# Patient Record
Sex: Female | Born: 1935
Health system: Southern US, Community
[De-identification: ages and names within clinical notes are randomized; demographics above are authoritative.]

## PROBLEM LIST (undated history)

## (undated) DIAGNOSIS — M169 Osteoarthritis of hip, unspecified: Secondary | ICD-10-CM

## (undated) DIAGNOSIS — F32A Depression, unspecified: Secondary | ICD-10-CM

## (undated) DIAGNOSIS — D509 Iron deficiency anemia, unspecified: Secondary | ICD-10-CM

## (undated) DIAGNOSIS — F329 Major depressive disorder, single episode, unspecified: Secondary | ICD-10-CM

## (undated) DIAGNOSIS — R269 Unspecified abnormalities of gait and mobility: Secondary | ICD-10-CM

## (undated) DIAGNOSIS — E876 Hypokalemia: Secondary | ICD-10-CM

## (undated) DIAGNOSIS — S329XXA Fracture of unspecified parts of lumbosacral spine and pelvis, initial encounter for closed fracture: Secondary | ICD-10-CM

## (undated) DIAGNOSIS — M810 Age-related osteoporosis without current pathological fracture: Secondary | ICD-10-CM

## (undated) DIAGNOSIS — C4491 Basal cell carcinoma of skin, unspecified: Secondary | ICD-10-CM

## (undated) DIAGNOSIS — I10 Essential (primary) hypertension: Secondary | ICD-10-CM

## (undated) HISTORY — PX: CATARACT EXTRACTION, BILATERAL: SHX1313

## (undated) HISTORY — DX: Basal cell carcinoma of skin, unspecified: C44.91

## (undated) HISTORY — PX: WRIST FRACTURE SURGERY: SHX121

## (undated) HISTORY — DX: Osteoarthritis of hip, unspecified: M16.9

## (undated) HISTORY — DX: Depression, unspecified: F32.A

## (undated) HISTORY — DX: Essential (primary) hypertension: I10

## (undated) HISTORY — PX: OTHER SURGICAL HISTORY: SHX169

## (undated) HISTORY — DX: Iron deficiency anemia, unspecified: D50.9

## (undated) HISTORY — DX: Unspecified abnormalities of gait and mobility: R26.9

## (undated) HISTORY — DX: Major depressive disorder, single episode, unspecified: F32.9

## (undated) HISTORY — DX: Age-related osteoporosis without current pathological fracture: M81.0

---

## 1994-10-07 HISTORY — PX: SPINAL FUSION: SHX223

## 1998-01-16 ENCOUNTER — Ambulatory Visit (HOSPITAL_COMMUNITY): Admission: RE | Admit: 1998-01-16 | Discharge: 1998-01-16 | Payer: Self-pay | Admitting: Diagnostic Radiology

## 1998-02-02 ENCOUNTER — Ambulatory Visit (HOSPITAL_COMMUNITY): Admission: RE | Admit: 1998-02-02 | Discharge: 1998-02-02 | Payer: Self-pay | Admitting: Orthopaedic Surgery

## 1998-02-16 ENCOUNTER — Ambulatory Visit (HOSPITAL_COMMUNITY): Admission: RE | Admit: 1998-02-16 | Discharge: 1998-02-16 | Payer: Self-pay | Admitting: Orthopaedic Surgery

## 1998-03-08 ENCOUNTER — Ambulatory Visit (HOSPITAL_COMMUNITY): Admission: RE | Admit: 1998-03-08 | Discharge: 1998-03-08 | Payer: Self-pay | Admitting: Orthopaedic Surgery

## 2000-01-11 ENCOUNTER — Encounter: Admission: RE | Admit: 2000-01-11 | Discharge: 2000-01-11 | Payer: Self-pay | Admitting: Diagnostic Radiology

## 2001-12-16 ENCOUNTER — Inpatient Hospital Stay (HOSPITAL_COMMUNITY): Admission: AD | Admit: 2001-12-16 | Discharge: 2001-12-19 | Payer: Self-pay | Admitting: Internal Medicine

## 2001-12-17 ENCOUNTER — Encounter: Payer: Self-pay | Admitting: Internal Medicine

## 2003-01-10 ENCOUNTER — Encounter: Payer: Self-pay | Admitting: Orthopaedic Surgery

## 2003-01-10 ENCOUNTER — Encounter: Admission: RE | Admit: 2003-01-10 | Discharge: 2003-01-10 | Payer: Self-pay | Admitting: Orthopaedic Surgery

## 2003-01-10 ENCOUNTER — Ambulatory Visit (HOSPITAL_COMMUNITY): Admission: RE | Admit: 2003-01-10 | Discharge: 2003-01-10 | Payer: Self-pay | Admitting: Orthopaedic Surgery

## 2003-06-24 ENCOUNTER — Encounter: Payer: Self-pay | Admitting: Pulmonary Disease

## 2003-06-24 ENCOUNTER — Encounter: Admission: RE | Admit: 2003-06-24 | Discharge: 2003-06-24 | Payer: Self-pay | Admitting: Pulmonary Disease

## 2003-07-13 ENCOUNTER — Ambulatory Visit (HOSPITAL_COMMUNITY): Admission: RE | Admit: 2003-07-13 | Discharge: 2003-07-13 | Payer: Self-pay | Admitting: Neurology

## 2003-07-13 ENCOUNTER — Encounter: Payer: Self-pay | Admitting: Neurology

## 2004-01-24 ENCOUNTER — Ambulatory Visit (HOSPITAL_COMMUNITY): Admission: RE | Admit: 2004-01-24 | Discharge: 2004-01-24 | Payer: Self-pay | Admitting: Neurology

## 2004-01-25 ENCOUNTER — Ambulatory Visit (HOSPITAL_COMMUNITY): Admission: RE | Admit: 2004-01-25 | Discharge: 2004-01-25 | Payer: Self-pay | Admitting: Neurology

## 2004-03-20 ENCOUNTER — Encounter: Admission: RE | Admit: 2004-03-20 | Discharge: 2004-03-20 | Payer: Self-pay | Admitting: Neurology

## 2004-03-20 ENCOUNTER — Ambulatory Visit (HOSPITAL_COMMUNITY): Admission: RE | Admit: 2004-03-20 | Discharge: 2004-03-20 | Payer: Self-pay | Admitting: Neurology

## 2004-03-21 ENCOUNTER — Encounter: Admission: RE | Admit: 2004-03-21 | Discharge: 2004-03-21 | Payer: Self-pay | Admitting: Neurology

## 2004-09-06 HISTORY — PX: HIP FRACTURE SURGERY: SHX118

## 2004-09-13 ENCOUNTER — Ambulatory Visit: Payer: Self-pay | Admitting: Internal Medicine

## 2004-09-13 ENCOUNTER — Inpatient Hospital Stay (HOSPITAL_COMMUNITY): Admission: AD | Admit: 2004-09-13 | Discharge: 2004-09-17 | Payer: Self-pay | Admitting: Orthopaedic Surgery

## 2004-09-17 ENCOUNTER — Ambulatory Visit: Payer: Self-pay | Admitting: Physical Medicine & Rehabilitation

## 2004-09-17 ENCOUNTER — Inpatient Hospital Stay
Admission: RE | Admit: 2004-09-17 | Discharge: 2004-10-06 | Payer: Self-pay | Admitting: Physical Medicine & Rehabilitation

## 2004-09-18 ENCOUNTER — Ambulatory Visit (HOSPITAL_COMMUNITY)
Admission: RE | Admit: 2004-09-18 | Discharge: 2004-09-18 | Payer: Self-pay | Admitting: Physical Medicine & Rehabilitation

## 2004-11-16 ENCOUNTER — Encounter: Admission: RE | Admit: 2004-11-16 | Discharge: 2004-11-16 | Payer: Self-pay | Admitting: Orthopaedic Surgery

## 2005-03-07 ENCOUNTER — Inpatient Hospital Stay (HOSPITAL_COMMUNITY): Admission: RE | Admit: 2005-03-07 | Discharge: 2005-03-11 | Payer: Self-pay | Admitting: Orthopaedic Surgery

## 2005-03-07 ENCOUNTER — Ambulatory Visit: Payer: Self-pay | Admitting: Physical Medicine & Rehabilitation

## 2005-03-11 ENCOUNTER — Inpatient Hospital Stay
Admission: RE | Admit: 2005-03-11 | Discharge: 2005-03-16 | Payer: Self-pay | Admitting: Physical Medicine & Rehabilitation

## 2005-08-28 ENCOUNTER — Other Ambulatory Visit: Admission: RE | Admit: 2005-08-28 | Discharge: 2005-08-28 | Payer: Self-pay | Admitting: Geriatric Medicine

## 2006-10-07 DIAGNOSIS — C4491 Basal cell carcinoma of skin, unspecified: Secondary | ICD-10-CM

## 2006-10-07 HISTORY — DX: Basal cell carcinoma of skin, unspecified: C44.91

## 2008-11-15 ENCOUNTER — Inpatient Hospital Stay (HOSPITAL_COMMUNITY): Admission: EM | Admit: 2008-11-15 | Discharge: 2008-11-17 | Payer: Self-pay | Admitting: Emergency Medicine

## 2008-11-16 ENCOUNTER — Encounter (INDEPENDENT_AMBULATORY_CARE_PROVIDER_SITE_OTHER): Payer: Self-pay | Admitting: Interventional Radiology

## 2008-11-26 ENCOUNTER — Emergency Department (HOSPITAL_COMMUNITY): Admission: EM | Admit: 2008-11-26 | Discharge: 2008-11-26 | Payer: Self-pay | Admitting: Emergency Medicine

## 2009-07-14 ENCOUNTER — Encounter: Admission: RE | Admit: 2009-07-14 | Discharge: 2009-07-14 | Payer: Self-pay | Admitting: Geriatric Medicine

## 2009-08-23 ENCOUNTER — Encounter: Admission: RE | Admit: 2009-08-23 | Discharge: 2009-08-23 | Payer: Self-pay | Admitting: Otolaryngology

## 2010-09-08 ENCOUNTER — Emergency Department (HOSPITAL_COMMUNITY)
Admission: EM | Admit: 2010-09-08 | Discharge: 2010-09-08 | Payer: Self-pay | Source: Home / Self Care | Admitting: Emergency Medicine

## 2010-09-15 ENCOUNTER — Ambulatory Visit (HOSPITAL_COMMUNITY)
Admission: RE | Admit: 2010-09-15 | Discharge: 2010-09-15 | Payer: Self-pay | Source: Home / Self Care | Attending: Family Medicine | Admitting: Family Medicine

## 2010-09-15 ENCOUNTER — Encounter (INDEPENDENT_AMBULATORY_CARE_PROVIDER_SITE_OTHER): Payer: Self-pay | Admitting: Family Medicine

## 2010-09-16 ENCOUNTER — Observation Stay (HOSPITAL_COMMUNITY)
Admission: EM | Admit: 2010-09-16 | Discharge: 2010-09-18 | Payer: Self-pay | Source: Home / Self Care | Attending: Internal Medicine | Admitting: Internal Medicine

## 2010-09-17 ENCOUNTER — Encounter (INDEPENDENT_AMBULATORY_CARE_PROVIDER_SITE_OTHER): Payer: Self-pay | Admitting: Emergency Medicine

## 2010-12-11 ENCOUNTER — Other Ambulatory Visit: Payer: Self-pay | Admitting: Dermatology

## 2010-12-17 LAB — URINALYSIS, ROUTINE W REFLEX MICROSCOPIC
Bilirubin Urine: NEGATIVE
Glucose, UA: NEGATIVE mg/dL
Hgb urine dipstick: NEGATIVE
Ketones, ur: NEGATIVE mg/dL
Nitrite: NEGATIVE
Protein, ur: NEGATIVE mg/dL
Specific Gravity, Urine: 1.015 (ref 1.005–1.030)
Urobilinogen, UA: 1 mg/dL (ref 0.0–1.0)
pH: 7 (ref 5.0–8.0)

## 2010-12-17 LAB — CBC
HCT: 32.3 % — ABNORMAL LOW (ref 36.0–46.0)
Hemoglobin: 10.5 g/dL — ABNORMAL LOW (ref 12.0–15.0)
Hemoglobin: 10.9 g/dL — ABNORMAL LOW (ref 12.0–15.0)
MCH: 29.5 pg (ref 26.0–34.0)
MCHC: 31.1 g/dL (ref 30.0–36.0)
Platelets: 357 10*3/uL (ref 150–400)
RBC: 3.41 MIL/uL — ABNORMAL LOW (ref 3.87–5.11)
RBC: 3.45 MIL/uL — ABNORMAL LOW (ref 3.87–5.11)
WBC: 10.9 10*3/uL — ABNORMAL HIGH (ref 4.0–10.5)

## 2010-12-17 LAB — CARDIAC PANEL(CRET KIN+CKTOT+MB+TROPI)
CK, MB: 0.8 ng/mL (ref 0.3–4.0)
CK, MB: 0.9 ng/mL (ref 0.3–4.0)
Relative Index: INVALID (ref 0.0–2.5)
Total CK: 40 U/L (ref 7–177)
Total CK: 43 U/L (ref 7–177)
Troponin I: 0.04 ng/mL (ref 0.00–0.06)

## 2010-12-17 LAB — GLUCOSE, CAPILLARY: Glucose-Capillary: 99 mg/dL (ref 70–99)

## 2010-12-17 LAB — POCT I-STAT, CHEM 8
BUN: 16 mg/dL (ref 6–23)
Chloride: 104 mEq/L (ref 96–112)
Glucose, Bld: 102 mg/dL — ABNORMAL HIGH (ref 70–99)
HCT: 37 % (ref 36.0–46.0)
Potassium: 3.9 mEq/L (ref 3.5–5.1)

## 2010-12-17 LAB — URINE CULTURE
Colony Count: NO GROWTH
Culture: NO GROWTH

## 2010-12-17 LAB — COMPREHENSIVE METABOLIC PANEL
ALT: 13 U/L (ref 0–35)
AST: 21 U/L (ref 0–37)
Albumin: 3.2 g/dL — ABNORMAL LOW (ref 3.5–5.2)
Calcium: 8.8 mg/dL (ref 8.4–10.5)
Creatinine, Ser: 0.77 mg/dL (ref 0.4–1.2)
GFR calc Af Amer: >60 mL/min (ref >60)
Sodium: 139 mEq/L (ref 135–145)
Total Protein: 6.5 g/dL (ref 6.0–8.3)

## 2010-12-17 LAB — URINE MICROSCOPIC-ADD ON

## 2010-12-17 LAB — DIFFERENTIAL
Basophils Absolute: 0 10*3/uL (ref 0.0–0.1)
Eosinophils Relative: 2 % (ref 0–5)
Lymphocytes Relative: 23 % (ref 12–46)
Lymphocytes Relative: 36 % (ref 12–46)
Lymphs Abs: 2.6 10*3/uL (ref 0.7–4.0)
Monocytes Absolute: 0.6 10*3/uL (ref 0.1–1.0)
Monocytes Absolute: 1.1 10*3/uL — ABNORMAL HIGH (ref 0.1–1.0)
Monocytes Relative: 10 % (ref 3–12)
Monocytes Relative: 8 % (ref 3–12)
Neutro Abs: 6.9 10*3/uL (ref 1.7–7.7)

## 2010-12-17 LAB — POCT CARDIAC MARKERS
CKMB, poc: <1 ng/mL — ABNORMAL LOW (ref 1.0–8.0)
Troponin i, poc: <0.05 ng/mL (ref 0.00–0.09)

## 2010-12-17 LAB — TSH: TSH: 5.143 u[IU]/mL — ABNORMAL HIGH (ref 0.350–4.500)

## 2010-12-17 LAB — APTT: aPTT: 38 seconds — ABNORMAL HIGH (ref 24–37)

## 2010-12-17 LAB — BASIC METABOLIC PANEL
CO2: 27 mEq/L (ref 19–32)
Glucose, Bld: 98 mg/dL (ref 70–99)
Potassium: 3.2 mEq/L — ABNORMAL LOW (ref 3.5–5.1)
Sodium: 140 mEq/L (ref 135–145)

## 2010-12-17 LAB — CK TOTAL AND CKMB (NOT AT ARMC): Total CK: 51 U/L (ref 7–177)

## 2010-12-17 LAB — MRSA PCR SCREENING: MRSA by PCR: NEGATIVE

## 2010-12-17 LAB — PROTIME-INR: Prothrombin Time: 13 seconds (ref 11.6–15.2)

## 2011-01-22 LAB — CBC
HCT: 32.2 % — ABNORMAL LOW (ref 36.0–46.0)
HCT: 32.4 % — ABNORMAL LOW (ref 36.0–46.0)
HCT: 36.8 % (ref 36.0–46.0)
Hemoglobin: 11.2 g/dL — ABNORMAL LOW (ref 12.0–15.0)
Hemoglobin: 12.8 g/dL (ref 12.0–15.0)
MCV: 96.2 fL (ref 78.0–100.0)
Platelets: 261 10*3/uL (ref 150–400)
Platelets: 283 10*3/uL (ref 150–400)
RBC: 3.37 MIL/uL — ABNORMAL LOW (ref 3.87–5.11)
RBC: 3.89 MIL/uL (ref 3.87–5.11)
RDW: 13.5 % (ref 11.5–15.5)
RDW: 13.5 % (ref 11.5–15.5)
WBC: 10 10*3/uL (ref 4.0–10.5)
WBC: 10.4 10*3/uL (ref 4.0–10.5)
WBC: 17.2 10*3/uL — ABNORMAL HIGH (ref 4.0–10.5)

## 2011-01-22 LAB — DIFFERENTIAL
Basophils Absolute: 0 10*3/uL (ref 0.0–0.1)
Eosinophils Relative: 0 % (ref 0–5)
Eosinophils Relative: 4 % (ref 0–5)
Lymphocytes Relative: 12 % (ref 12–46)
Lymphocytes Relative: 7 % — ABNORMAL LOW (ref 12–46)
Lymphs Abs: 1.1 10*3/uL (ref 0.7–4.0)
Lymphs Abs: 1.2 10*3/uL (ref 0.7–4.0)
Monocytes Absolute: 0.9 10*3/uL (ref 0.1–1.0)
Monocytes Relative: 5 % (ref 3–12)
Neutro Abs: 7.8 10*3/uL — ABNORMAL HIGH (ref 1.7–7.7)
Neutrophils Relative %: 79 % — ABNORMAL HIGH (ref 43–77)

## 2011-01-22 LAB — POCT CARDIAC MARKERS
CKMB, poc: <1 ng/mL — ABNORMAL LOW (ref 1.0–8.0)
Troponin i, poc: <0.05 ng/mL (ref 0.00–0.09)

## 2011-01-22 LAB — URINALYSIS, ROUTINE W REFLEX MICROSCOPIC
Bilirubin Urine: NEGATIVE
Hgb urine dipstick: NEGATIVE
Nitrite: NEGATIVE
Specific Gravity, Urine: 1.012 (ref 1.005–1.030)
pH: 7.5 (ref 5.0–8.0)

## 2011-01-22 LAB — BASIC METABOLIC PANEL
GFR calc non Af Amer: >60 mL/min (ref >60)
Glucose, Bld: 99 mg/dL (ref 70–99)
Potassium: 4.1 mEq/L (ref 3.5–5.1)
Sodium: 138 mEq/L (ref 135–145)

## 2011-01-22 LAB — APTT: aPTT: 31 seconds (ref 24–37)

## 2011-02-19 NOTE — Discharge Summary (Signed)
[name], [name]              ACCOUNT NO.:  [id number]   MEDICAL RECORD NO.:  [id number]          PATIENT TYPE:  INP   LOCATION:  2013                         FACILITY:  [facility]   PHYSICIAN:  Corinna L. Lendell Caprice, MDDATE OF BIRTH:  1919-10-03   DATE OF ADMISSION:  11/15/2008  DATE OF DISCHARGE:  11/17/2008                               DISCHARGE SUMMARY   DISCHARGE DIAGNOSES:  1. Status post fall and resulting vertebral compression fractures at      T12 and L2.  2. Status post T12 and L2 kyphoplasties by interventional radiology on      November 16, 2008.  3. Hard-of-hearing.  4. Osteoporosis.  5. Chronic constipation.  6. Hypertension.   DISCHARGE MEDICATIONS:  1. Tylenol 650 mg p.o. q.4 h p.r.n. pain.  2. Continue Celexa at previous dose, question 20 vs. 40 mg a day.  3. Norvasc 2.5 mg a day.  4. Boniva monthly.  5. Aspirin 81 mg a day.  6. Stool softener daily.  7. Laxative as needed.  8. Vicodin 1 p.o. q.6 h p.r.n. pain.   CONDITION:  Stable.   CONSULTATIONS:  Interventional radiology.   PROCEDURES:  As above.   DIET:  Should be as tolerated.   ACTIVITY:  Fall precautions, continue with walker.  Home physical  therapy, occupational therapy has been arranged.  Follow-up with Dr.  Pete Glatter in 1-2 months, or sooner if needed.   LABS:  White count on admission was 17,000 with 88% neutrophils, the day  after admission was 10,000.  Basic metabolic panel, PT/PTT cardiac  enzymes, urinalysis normal.  Electrocardiogram showed normal sinus  rhythm with left atrial abnormality and left axis deviation.  CT of the  C-spine showed no fracture, anterior subluxation of C7 on T1.  CT of the  brain showed no acute intracranial findings, chronic small vessel  changes.  Two views of the chest showed low lung volumes, nothing acute.  Lumbar spine x-ray showed multilevel compression deformities are age  indeterminate, new from MRI from 2005.  MRI of the lumbar spine shows  minimal  loss of height at acute compression fractures of T12 and L2  without significant retropulsion of bone, healed L1 compression fracture  with over 50% loss of height and slight retropulsion of bone but no  significant associated stenosis.  Levoconvex scoliosis centered at L2-3,  grade 1 anterolisthesis of L3-4 with chronic endplate marrow change,  severe right foraminal and moderate left foraminal narrowing, moderate  to severe central canal stenosis at L3-4, mild central canal and  bifemoral biforaminal narrowing at L2-3, right greater than left, and  mild right foraminal narrowing at L1-2.  Grade 2 anterolisthesis at C7-  T1 and completely evaluated minimal disk bulging at T9-10, superior  endplate plate fracture of L2 with minimal loss of height, superior  endplate compression fracture T12 with minimal loss of height.   HISTORY AND HOSPITAL COURSE:  [name] is a pleasant 77 year old  white female patient of Dr. Pete Glatter who fell and was brought to the  emergency room.  The details surrounding the fall are rather sketchy.  Nevertheless, she  was found to have multiple vertebral compression  fractures, two of which were acute.  Patient was given pain medication  and supportive care.  Interventional radiology was consulted and  kyphoplasty was performed as above.  Interventional radiology has  evaluated the patient and cleared her for discharge.  Physical therapy  has evaluated the patient and recommended back to assisted living with  home PT, OT which I asked be arranged.  I have spoken with family and  they are agreeable to plan.      Corinna L. Lendell Caprice, MD  Electronically Signed     CLS/MEDQ  D:  11/17/2008  T:  11/17/2008  Job:  [id number]   cc:   Hal T. Stoneking, M.D.

## 2011-02-19 NOTE — H&P (Signed)
NAMELASHAWNDA, HANCOX              ACCOUNT NO.:  [id number]   MEDICAL RECORD NO.:  [id number]          PATIENT TYPE:  INP   LOCATION:  1833                         FACILITY:  [facility]   PHYSICIAN:  Hollice Espy, M.D.DATE OF BIRTH:  07-02-1919   DATE OF ADMISSION:  11/15/2008  DATE OF DISCHARGE:                              HISTORY & PHYSICAL   The patient's PCP is Dr. Merlene Laughter.   CONSULTANT:  On this case is interventional radiology.   CHIEF COMPLAINT:  Syncope and fall.   HISTORY OF PRESENT ILLNESS:  The patient is unable to recall much  history.  Her history is obtained from her daughter who is not present  but got reports from assisted living.  Apparently, the patient resides  at assisted-living facility and today was meant to catch a tour bus to  go on a trip.  Normally at her baseline, she is able to ambulate with a  walker.  It is unclear whether the patient fell first and then passed  out, or if she passed out leading to a fall, but either way, she fell  landing on her back.  She was then brought into the emergency room for  further evaluation.  In the emergency room, the patient had x-rays done  which noted a normal CT scan of the head and C-spine.  However, the  lumbar spine x-ray noted multilevel compression deformities that were  age-indeterminate felt to be new compared to an MRI done March 20, 2004.  In addition, the patient had some blood work done, and she was found  have essentially normal blood work except for a white count of 17.2 with  an 88% shift.  She had a negative chest x-ray and urinalysis in regards  to infection.  She was not hypotensive, and actually her blood pressure  160/66.  The patient herself was complaining of some back pain, she says  when she moves; when she does not move, it does not hurt, but she says  she can barely move.  Otherwise, she is doing well.  She denies any  headaches, vision changes.  No dysphagia.  No chest pain,  palpitations,  shortness of breath, wheezing or coughing.  No abdominal pain.  No  hematuria or dysuria.  No constipation, no diarrhea.  Pain only in her  back.  No weakness, numbness or pain in her legs.   REVIEW OF SYSTEMS:  Otherwise negative.   The patient's past medical history includes hypertension, previous  history of a right total hip arthroplasty in June 2006, history of  postoperative anemia, history of depression, osteoporosis, reportedly  history of early Alzheimer's although the patient appears to quite alert  and oriented for the most part when I was interviewing her.   MEDICATIONS:  Currently, we are trying to obtain the records sent over  from the assisted living facility.  This is pending.   ALLERGIES:  She has an allergy to PENICILLIN.   SOCIAL HISTORY:  No tobacco, alcohol or drug use.  She resides in an  assisted living facility.   FAMILY HISTORY:  Noncontributory.  PHYSICAL EXAMINATION:  VITALS:  On admission temperature 98.2, heart  rate 78, blood pressure 160/66 - now down to 136/76, respirations 18, O2  sat 99% on 2 liters.  In general, she is alert and oriented x2 and  pleasant no apparent distress except when she tries to move.  HEENT:  Normocephalic atraumatic.  Mucous membranes are slightly dry.  She has no carotid bruits.  HEART:  Regular rate and rhythm, S1-S2.  LUNGS:  Clear to auscultation bilaterally.  ABDOMEN:  Soft, nontender, nondistended.  Positive bowel sounds.  EXTREMITIES:  Showed no clubbing, cyanosis or edema.  She has good  musculoskeletal range in her legs and feet.  Pedal pulses are normal.  She has no loss of sensation.  No findings of any neurological deficits.   LAB WORK:  Lumbar spine films note as per HPI multilevel compression  deformities new since June 2005.  Chest x-ray notes low lung volumes  with no evidence of any acute findings.  Cluster of nodular densities  within the left upper lobe are chronic.  Chronic rib  fracture on the  left 8th rib and right lateral rib.  CT scan of the head shows no acute  intracranial findings, chronic small vessel disease changes.  Cervical  spine:  No fracture, some anterior subluxation of C7 on T1.  Urinalysis  normal.  Sodium 130, potassium 4.1, chloride 102, bicarb 28, BUN 11,  creatinine 0.6, glucose 99.  Cardiac markers unremarkable.  Coags  unremarkable.  White count 17.2 with an 88% shift, H and H 12.8 and 37,  MCV of 95, platelet count 343.   ASSESSMENT AND PLAN:  1. Syncope.  2. Fall.  3. Questionable new compression fracture.  4. Back pain.  5. Leukocytosis.  6. Hypertension.   Will check MRI, PT, OT, questionable kyphoplasty.  Will consult  interventional radiology.  Monitor on telemetry.  Pain and nausea  control.  Hold her blood pressure medicines until we determine that she  is not having orthostatic hypotension with syncope.      Hollice Espy, M.D.  Electronically Signed     SKK/MEDQ  D:  11/15/2008  T:  11/15/2008  Job:  [id number]   cc:   Hal T. Stoneking, M.D.

## 2011-02-22 NOTE — Op Note (Signed)
NAME:  [name], [name]                        ACCOUNT NO.:  [id number]   MEDICAL RECORD NO.:  [id number]                   PATIENT TYPE:  OUT   LOCATION:  MDC                                  FACILITY:  [facility]   PHYSICIAN:  Genene Churn. Love, M.D.                 DATE OF BIRTH:  1919-10-07   DATE OF PROCEDURE:  01/25/2004  DATE OF DISCHARGE:                                 OPERATIVE REPORT   PROCEDURE NOTE:  The patient was prepped and draped in the left lateral  decubitus position using Betadine and 1% Xylocaine.  She had had previous  fusions at what appeared to be L4-5 and L5-S1 and L3-L4 was entered.  Opening pressure of 120 mm of water and 25 cc of clear colored CSF was  removed.  The patient tolerated it well.  A stand-walk and sit test was  performed with two chairs, 15 feet apart, before and after the procedure.  The before LP time was 1 minute and 9 seconds and after LP time was 1  minute, 15 seconds.   IMPRESSION:  Negative LP drainage procedure test for NPH.  Fluid was sent  for studies, including protein, glucose, VDRL, cell count, diff, tau, and  beta amyloid 42 protein, with 2 tubes to be held.                                               Genene Churn. Sandria Manly, M.D.    JML/MEDQ  D:  01/25/2004  T:  01/25/2004  Job:  [id number]

## 2011-02-22 NOTE — Consult Note (Signed)
NAMEALIVYA, [name]              ACCOUNT NO.:  [id number]   MEDICAL RECORD NO.:  [id number]          PATIENT TYPE:  ORB   LOCATION:  4522                         FACILITY:  [facility]   PHYSICIAN:  Genene Churn. Love, M.D.    DATE OF BIRTH:  [birth year]   DATE OF CONSULTATION:  09/20/2004  DATE OF DISCHARGE:                                   CONSULTATION   This 77 year old right-handed widowed white female from [place], [place], is seen while in the [facility] for evaluation of a fall and gait disorder.   HISTORY OF PRESENT ILLNESS:  Ms. Curtis has a history of low back pain and  underwent L5-S1 fusion in 1976 for left lumbar radiculopathy.  She has had  recurrent back pain and been seen by me in the summer of 2005 with L4 and L3  nerve root irritation on the right and documented MRI study of the lumbar  spine showing listhesis of 3 on 4 by 8 to 9 mm with central canal stenosis,  lateral recess stenosis, left canal stenosis and right canal stenosis and  right foraminal encroachment at that level involving the right L3 and right  L4 nerve roots.  In addition, there was lesser stenosis at L2-3 and at L4-5.  She also has a history of a chronic memory disorder with behavioral changes  beginning in 2004.  At one point it was decided that she would transfer her  residence to [place], [place], to [place] but she decided  against that decision in the summer of 2005.  She has had a history of  balance problems for over six years with tendency to fall beginning in 2001,  breaking ribs; with a fall also in 2004 and injury to her left wrist  requiring fixation.  She has had hypokalemia presumably secondary to  diuretic use.  She has not had any history of alcoholism.  She has undergone  a MRI study of the brain showing evidence of mild atrophy and small vessel  disease, ischemic changes July 13, 2003, with an MRA of the  intracranial  system showing mild atherosclerotic changes without significant proximal  stenosis.  TSH, sed rate and vitamin B12 level have been normal but CSF  evaluation was positive for tau and beta amyloid, 42 protein indicative of  Alzheimer's disease.  She has had a known history of hypertension but no  documented clinical stroke.  No cigarette use.  No diabetes mellitus and no  known heart disease. She was in her usual state of health until September 13, 2004, when she got up to go to the bathroom about 3 a.m. and fell.  There is  no associated neurologic symptomatology.  She was transferred from  [place], [place], to [place], [place], and underwent  left hip screw fixation by Dr. Cleophas Dunker.  She has been on the subacute  rehab unit since that time.   Her laboratory data in the hospital has revealed negative urine for  infection.  White blood cell count 6600, hemoglobin 12.5 with hematocrit  38.0, platelet count 364, 50% polys, 30% lymphs, 11% monocytes, 6%  eosinophils.  Her basic metabolic panel has been normal.  Sodium 140,  potassium 3.7, chloride 104, CO2 content 28, glucose 98, BUN 8 to 10.  Mildly elevated liver function tests with AST of 39 upper limits of normal  37, ALT 48 upper limits of normal 40 and a slightly elevated total bilirubin  of 1.3.   Her CT scan of the chest with contrast September 18, 2004, showed asymmetric  pleural opacities in the right apex and posterior left upper lobe probably  secondary to scarring but neoplasm could not be excluded.  It was  recommended that a follow-up CT scan be obtained in three months.  She had a  2 cm fluid density lesion in the upper pole of the right kidney which has  been known about and a tiny notch on the right posterior thyroid.   PAST MEDICAL HISTORY:  1.  Gait disorder.  2.  Right L3 and L4 radiculopathies.  3.  Lumbar spinal stenosis, L3-4 greater than L2-3 and L5-S1.  4.  History of previous  lumbar spine surgery for left lumbar radiculopathy      in 1976 at L5-S1 with fusion.  5.  Alzheimer's disease.  6.  She is status post bilateral cataract surgery.  7.  She is noted to have a cyst on her kidney.  8.  She has had a wrist fracture in the past.   MEDICATIONS PRIOR TO ADMISSION:  1.  Norvasc 5 mg daily.  2.  Celexa 20 mg daily.  3.  Fosamax 70 mg.   CURRENT MEDICATIONS:  1.  Warfarin.  2.  Celexa.  3.  KCl.  4.  Norvasc.  5.  Senokot.  6.  Multivitamins.  7.  Lovenox.  8.  Also p.r.n.'s.   PHYSICAL EXAMINATION:  GENERAL APPEARANCE:  A well-developed, thin white  female.  VITAL SIGNS:  Blood pressure right and left arm of 160/80, heart rate 65,  temperature 97.8, respiratory rate 20, O2 saturation 98% on room air.  NEUROLOGIC:  She was alert and oriented x3.  She scored 27/30 on an MMSE.  Cranial nerves revealed visual fields full.  She was status post cataract  surgery bilaterally.  Discs were flat.  Extraocular movements were full and  corneals were present.  There was no seventh nerve palsy.  Tongue was  midline.  The uvula was midline.  Gags were present.  Sternocleidomastoid  and trapezius testing were normal.  Motor examination revealed outstretched  hand and arm tremor.  Gait was not tested but strength testing individually  was intact.  She had absent ankle jerks.  Downgoing plantar responses.  She  felt pinprick in all extremities.  As mentioned, her gait was not tested.   IMPRESSION:  1.  Gait disorder with retropulsion, status post left hip fracture, code      781.2.  2.  Lumbar spine disease with spinal stenosis L3-4 greater than L2-3 and L4-      5 with listhesis at L3-4 of 8 mm.  She is status post L5-S1 fusion in      1976 for left lumbar radiculopathy, code 724.4, 724.02.  3.  Dementia, most likely Alzheimer's disease, code 331.0.  4.  Hypertension, code 796.2.  5.  Osteoporosis, code 733.0.  6.  History of left wrist fracture.  PLAN:   Consider cervical spine MRI for evaluation of her gait in three weeks  following her evaluation.  Other studies are present in my office including  PET scan, MRIs of the brain, CSF evaluation, B12, T4.       JML/MEDQ  D:  09/20/2004  T:  09/20/2004  Job:  [id number]   cc:   Hal T. Stoneking, M.D.  [address]  [place], [place] [address]  Fax: [phone]   Claude Manges. Cleophas Dunker, M.D.  [address] [place]  [place] [address]  Fax: [phone]

## 2011-02-22 NOTE — H&P (Signed)
NAMEKYLEE, Curtis NO.:  [id number]   MEDICAL RECORD NO.:  [id number]            PATIENT TYPE:   LOCATION:                                 FACILITY:   PHYSICIAN:  Claude Manges. Whitfield, M.D.DATE OF BIRTH:  [birth year]   DATE OF ADMISSION:  03/07/2005  DATE OF DISCHARGE:                                HISTORY & PHYSICAL   CHIEF COMPLAINT:  Right hip pain.   HISTORY OF PRESENT ILLNESS:  The patient is an 77 year old white female with  a recent history of a left hip nondisplaced intertrochanteric fracture which  was repaired with compression screw fixation.  The patient has been doing  well with that, but she also has been having problems with her right hip for  several years now.  With ambulating and transitioning from sitting to  standing, she has some discomfort in the hip described as a deep dull aching  sensation in the groin around to the lateral posterior buttocks region with  some occasional radiation down to the knee.  She has a sense that her hip is  shifting around.  She has some stiffness and soreness with attempts at range  of motion.  She does have night pain.  She is currently ambulating with the  use of a walker due to a previous fall history.   X-rays reveal collapse of the femoral head indicating possible avascular  necrosis.   ALLERGIES:  DICLOXACILLIN.   CURRENT MEDICATIONS:  1.  Fosamax 70 mg p.o. q.week.  2.  Norvasc 5 mg p.o. daily.  3.  Citalopram 40 mg p.o. daily.  4.  Ibuprofen 200 mg p.o. p.r.n.  5.  Acetaminophen 500 mg p.o. p.r.n.  6.  Glucosamine with MSN one tablet p.o. daily.  7.  Calcium one tablet a day.  8.  Baby aspirin 81 mg p.o. daily.   PAST MEDICAL HISTORY:  Hypertension.   PAST SURGICAL HISTORY:  Spinal fusion in [facility] at [facility] by Dr. __________.  Hand surgery by Katy Fitch. Sypher, M.D.  Left hip fracture repaired by Claude Manges. Cleophas Dunker, M.D. in December of 2005.  The patient states that her only  complications with  anesthesia in the past is post spinal fusion she passed  out and had to be resuscitated.   SOCIAL HISTORY:  The patient is an 77 year old white female living at  assisted living.  She denies any history of smoking or alcohol use.  She is  widowed with several grown children.  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   PRIMARY CARE PHYSICIAN:  Hal T. Stoneking, M.D.   FAMILY HISTORY:  Mother is deceased from pulmonary edema.  Father is  deceased from an accident.  One sister deceased from complications of  diabetes.   REVIEW OF SYSTEMS:  Positive for partial lower plates.  She does wear  glasses.  She does wear a left-sided hearing aid.  Otherwise, all other  categories are negative and noncontributory.   PHYSICAL EXAMINATION:  VITAL SIGNS:  Height is 5 feet 11 inches, weight is  98 pounds, blood pressure 112/60,  pulse 76 and regular, respirations 12, the  patient is afebrile.  GENERAL:  This is a short in stature, thin, frail white female who ambulates  with the use of a walker.  Very slow, deliberate gait.  She is able to get  on and off the examination table with just a little balance assistance.  HEENT:  Head was normocephalic.  Pupils equal, round, and reactive to light.  Extraocular movements intact.  Sclerae is anicteric.  External ears without  deformity.  Left hearing aid is intact.  The patient is able to hear fairly  well with normal voice tones.  NECK:  Supple, no palpable lymphadenopathy.  Thyroid region nontender.  She  had good range of motion of her cervical spine without difficulty or  tenderness.  CHEST:  Lung sounds were clear and equal bilaterally.  No wheezes, rales, or  rhonchi.  HEART:  Regular rate and rhythm, no murmurs, rubs, or gallops.  ABDOMEN:  Soft, nontender.  Bowel sounds normal active throughout.  CVA  region was nontender.  EXTREMITIES:  Upper extremities were symmetrically size and shape.  She had  full range of motion of her  shoulders, elbows, and wrists.  Motor strength  was 5/5.  Lower extremities; left hip had full extension.  She was easily  able to flex it up to about 115 degrees.  She had about 15 to 20 degrees  internal and external rotation without any discomfort.  Right hip had full  extension.  She was able to flex it up to about 110 degrees.  She had about  10 degrees of internal and external rotation with some discomfort over the  last aspect of the hip with internal rotation.  Bilateral knees were  symmetrical.  No signs of erythema or effusions.  No discomfort.  She had  full range of motion.  No instability.  Calves were nontender.  The ankles  were symmetrical with good dorsal and plantar flexion.  PERIPHEROVASCULAR:  Carotid pulses were 2+ with no bruits.  Radial pulses  were 2+.  Unable to palpate any posterior or tibial or dorsalis pedis  pulses, but her skin was warm, dry, and intact.  No pigmentation changes.  Blanched briskly.  No lower extremity edema noted at this time.  NEUROLOGY:  The patient was conscious, alert, and appropriate.  Easy  conversation with the examiner.  Cranial nerves II-XII grossly intact.  She  had no gross neurologic defects noted.  BREASTS:  RECTAL:  GENITOURINARY:  Deferred at this time.   IMPRESSION:  1.  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  2.  History of left hip intertrochanteric fracture with compression screw      fixation in December of 2005.  3.  Hypertension.  4.  Questionable difficulty with balance.   PLAN:  The patient has been evaluated by her primary care physician, Dr.  Pete Glatter, for preoperative evaluation and he has cleared her.  The patient  will undergo all other routine labs and tests prior to having a right total  hip arthroplasty by Dr. Cleophas Dunker at [facility] on March 07, 2005.      RWK/MEDQ  D:  02/06/2005  T:  02/06/2005  Job:  [id number]

## 2011-02-22 NOTE — Discharge Summary (Signed)
[facility]  Patient:    [name], [name] Visit Number: [id number] MRN: [id number]          Service Type: MED Location: [phone] Attending Physician:  Edwyna Perfect Dictated by:   Bonnell Public, M.D. Admit Date:  12/16/2001 Discharge Date: 12/19/2001                             Discharge Summary  DATE OF BIRTH:  1919-03-17  DISCHARGE DIAGNOSES: 1. Escherichia coli bacteremia. 2. Gastroenteritis. 3. Deconditioning. 4. Mild hyponatremia. 5. Normocytic anemia. 6. Hypokalemia. 7. Hypertension.  DISCHARGE MEDICATIONS: 1. Tequin 400 mg p.o. q.d. 2. Restoril 50 mg p.o. q.h.s. p.r.n. 3. Ambien 5 to 10 mg p.o. q.h.s. p.r.n. 4. Norvasc 5 mg p.o. q.d.  HISTORY OF PRESENT ILLNESS:  [name] is a very active and usually healthy 77 year old white female with past medical history or above who is being transferred from [facility] system secondary to severe gastroenteritis with E. coli bacteremia, the main reasoning being that her family lives in [place] and would not be able to take care of her personal affairs while she is away.  The patient had large amounts of mucoid, watery diarrhea without any blood prior to admission at the other hospital.  She also had nausea and poor appetite.  She also reports some chills.  The patient then collapsed, was taken to the hospital, and was found to be hypotensive, hyponatremic, and hypokalemic.  She was resuscitated with IV fluids and given Rocephin.  Blood cultures return 2 out of 2 positive for E. coli sensitive to fluoroquinolones and Trim-Sulfa.  During the few days in the [facility], she apparently did not get out of bed.  When she did finally ambulate, she was noted to be very weak and unstable in her gait.  Her gait was also very poor. The patient was transferred to our hospital on December 16, 2001.  PAST MEDICAL HISTORY:  As above.  It also includes  status post three C sections and lumbar stenosis with surgery in 1976.  History of TB treated three different times.  SOCIAL HISTORY:  The patient lives in [place] with her 58 year old husband who is also active.  No tobacco, no alcohol.  PHYSICAL EXAMINATION:  VITAL SIGNS:  On transfer, temperature was 99.2, pulse 75, blood pressure 142/62, O2 saturation 93% on room air.  GENERAL:  Alert and oriented x 3, very pleasant white female.  HEENT:  Normocephalic, atraumatic.  PERRL.  No oropharyngeal erythema or exudate.  NECK:  No JVD, no lymphadenopathy, no bruits.  LUNGS:  Clear to auscultation bilaterally, no wheezing.  CARDIOVASCULAR:  Regular rate and rhythm.  S1, S2, with 3/5 systolic murmur, left sternal border.  ABDOMEN:  Soft, nontender, mildly distended.  No organomegaly.  Active bowel sounds.  NEUROLOGIC:  Cranial nerves II-XII grossly intact.  No motor or sensory deficits.  She did have a guarded gait without any cerebellar findings.  Her motor exam was 5/5, symmetrical, all extremities.  LABORATORY DATA:  Sodium 133, potassium 3.9, chloride 103, CO2 25, BUN 6, creatinine 0.7, glucose 112.  White blood cells 7.8, hemoglobin 9.9, platelets 347.  Total bilirubin 0.5, alkaline phosphatase 112, ALP 26, ALT 28, total protein 5.1, albumin 2.1.  HOSPITAL COURSE:  #1 - ESCHERICHIA COLI BACTEREMIA:  This was secondary to severe gastroenteritis.  The patient continued to receive Tequin intravenously.  The  bacteria were sensitive to fluoroquinolones.  The patient did well and finished a course of Tequin.  #2 - GASTROENTERITIS:  Her symptoms had essentially resolved by the time she was transferred to our hospital.  The patient, however, was still weak and thought to be still mildly dehydrated.  Therefore, IV fluids were continued at a rate of 150 cc per hour of normal saline.  #3 - DECONDITIONING:  We obtained a physical therapy consult, and the patient was encouraged  to ambulate during her days in the hospital.   The patient actually did fairly well and was ambulating in the hallways with the aid of a walker by the time of discharge.  We felt confident in sending her home given that she has led a very physically active life up to the time of hospitalization.  Apparently this patient goes to the gym about once a day and engages in cardiovascular activities.  #4 - HYPONATREMIA SECONDARY TO DEHYDRATION:  This was corrected by the time of discharge.  #5 - NORMOCYTIC ANEMIA:  We had checked ferritin which was 80, vitamin B12 level normal, and folate level normal.  By the time of discharge, her hemoglobin was 10.1.  It was noted to her family, who are also physicians, that she will need outpatient workup for this unexplained anemia in order to essentially rule out possibility of colon cancer.  #6 - HYPERTENSION:  The patients blood pressure was elevated on the day of discharge at 158/72.  The patient was sent out on Norvasc 5 mg a day which is half of her regular dose. Dictated by:   Bonnell Public, M.D. Attending Physician:  Edwyna Perfect DD:  02/05/02 TD:  02/05/02 Job: [id number]

## 2011-02-22 NOTE — Discharge Summary (Signed)
[name], [name] NO.:  [id number]   MEDICAL RECORD NO.:  [id number]          PATIENT TYPE:  INP   LOCATION:  5023                         FACILITY:  [facility]   PHYSICIAN:  Claude Manges. Whitfield, M.D.DATE OF BIRTH:  Jun 05, 1919   DATE OF ADMISSION:  09/13/2004  DATE OF DISCHARGE:  09/17/2004                                 DISCHARGE SUMMARY   ADMISSION DIAGNOSES:  1.  Left intertrochanteric fracture.  2.  Hypertension.  3.  Balance disturbance.  4.  Early Alzheimer's.  5.  Depression.  6.  Osteoarthritis right hip.  7.  Degenerative disc disease lumbar spine.   DISCHARGE DIAGNOSES:  1.  Left hip compression screw and plate fixation of intertrochanteric      fracture.  2.  Urinary tract infection.  3.  Postoperative blood loss anemia requiring transfusion.  4.  History of hypertension.  5.  History of balance disturbance.  6.  History of early Alzheimer's.  7.  History of depression.  8.  History of osteoarthritis right hip.  9.  History of degenerative disc disease lumbar spine.   HISTORY OF PRESENT ILLNESS:  The patient is an 77 year old female with a  history of balance problems and possible early Alzheimer's, who was going to  the bathroom on the date of admission when she fell on her left side.  The  patient complained of left hip pain and was unable to get up and off the  floor.  Was found later the following morning when her driver came to pick  her up.  The patient was brought to the emergency room for evaluation and  was found to have a left hip intertrochanteric fracture, and she was  admitted to obtain medical clearance for the surgical procedure for this  repair.   ALLERGIES:  DICLOXACILLIN.   CURRENT MEDICATIONS:  1.  Fosamax 70 mg p.o. q. week.  2.  Norvasc 5 mg p.o. daily.  3.  Celexa 20 mg p.o. daily.  4.  Multivitamin.  5.  Vitamin E.   SURGICAL PROCEDURE:  On September 13, 2004, the patient was taken to the O.R.  by Dr. Norlene Campbell, and underwent general anesthesia the patient had a  compression screw and plate fixation of her intertrochanteric fracture of  her left hip.  The patient tolerated the procedure well.  There were no  complications.  The patient was transferred to the recovery room in good  condition.   CONSULTS:  1.  A [facility] consult was requested for evaluation of the      patient for medical clearance and for management of her multiple medical      issues during her hospitalization.  2.  Routine consults for physical therapy, occupational therapy, and rehab      were requested for her care.   HOSPITAL COURSE:  On September 13, 2004, the patient was admitted to [facility] under the care of Dr. Norlene Campbell.  The patient was  evaluated in the emergency room, found to have a left intertrochanteric  fracture, also requiring medical evaluation prior to  surgical procedure, so  this was requested and performed by the [facility] practice,  and the patient was cleared for surgery, with recommendations of IV  potassium replacement and evaluation of possible elevated assisted living  level of care postoperatively due to her current capabilities.  The patient  was then subsequently taken to the O.R. by Dr. Cleophas Dunker, where an ORIF of  her intertrochanteric fracture was performed with a compression screw and  plate without any complications.   The patient then incurred a total of four days of postoperative care on the  orthopedic floor, in which the patient did have some low-grade temperatures  for multiple days, but she was also found to have some bacteria on her  admission urinalysis, so she was placed on Cipro, and her urine officially  cleared as did her low-grade temperatures.  The patient's mental status  remained stable, and she worked fairly well with the staff.  Her wound  remained benign for any signs of infection.  Her leg remained neuromotor   vascularly intact.  She also developed some postoperative blood loss anemia,  and she had a little light-headedness with getting up for ambulation, so she  was typed and crossed and transfused 2 units of packed red blood cells, with  followup on the rehab unit.  Her hypokalemia did resolve without any further  complications, and the patient was evaluated by rehab services and accepted  on the subacute care unit due to the length of stay which they felt was  going to be necessary to get her independent, so she was transferred to that  unit in good condition on postop day four.   LABORATORIES:  Chest x-ray on September 15, 2004 showed no acute  cardiopulmonary disease, with a possible sclerotic lesion involving  posterior 12th ribs bilaterally and the right posterior 10th rib,  questioning whether there was a history of malignancy.  These results were  telephoned to the clinical services caring for the patient on 5000.   CBC on September 16, 2004:  WBC 7.4, hemoglobin 8.4, hematocrit 24, platelets  296.  She was typed and crossed and transfused 2 units of packed red blood  cells, with followup on the next chart for rehab services.   On September 17, 2004, INR was 1.2.   Routine chemistries on September 16, 2004:  Sodium 138, potassium 3.6,  glucose 104, BUN 8, creatinine 0.5.   Urinalysis on September 13, 2004 found trace hemoglobin, rare bacteria, and on  a recheck on September 15, 2004 found trace hemoglobin, no bacteria noted.  Culture was no growth after one day.   MEDICATIONS UPON DISCHARGE FROM ORTHOPEDICS FLOOR:  1.  Norvasc 5 mg p.o. daily.  2.  Celexa 20 mg p.o. daily.  3.  Multivitamin with minerals 1 tablet p.o. daily.  4.  Colace 100 mg p.o. b.i.d.  5.  Laxative or enema of choice p.r.n.  6.  Vicodin 1 tablet p.o. q.4 h. p.r.n.  7.  Tylenol 650 mg p.o. q.4 h. p.r.n.  8.  Robaxin 500 mg p.o. q.6 h. p.r.n.  9.  Coumadin 3 mg p.o. daily. 10. Potassium chloride 20 mEq p.o.  b.i.d.  11. Cipro 250 mg p.o. b.i.d.   DISCHARGE INSTRUCTIONS:  1.  Medications.  The patient is continue medications as dispensed on      orthopedics floor.  2.  Labs.  Please check hemoglobin level after receiving transfusion.  3.  Diet.  No restrictions.  4.  Wound care.  The patient should have wound checked daily for any signs      of infection.  Staples may be removed on postop day 14, with Steri-      Strips applied if in the hospital.  5.  Activity.  The patient may be weightbearing as tolerated with the use of      a walker and close supervision with physical therapy.  6.  Followup.  The patient needs a follow-up appointment with Dr. Cleophas Dunker      in his office one week from discharge from rehab services.  Please call      to set up this appointment.   CONDITION UPON DISCHARGE TO REHAB SERVICES:  Improved and good.      RWK/MEDQ  D:  11/21/2004  T:  11/21/2004  Job:  [id number]

## 2011-02-22 NOTE — H&P (Signed)
[name], [name] NO.:  [id number]   MEDICAL RECORD NO.:  [id number]          PATIENT TYPE:  ORB   LOCATION:  4524                         FACILITY:  [facility]   PHYSICIAN:  Ranelle Oyster, M.D.DATE OF BIRTH:  03-05-1919   DATE OF ADMISSION:  03/11/2005  DATE OF DISCHARGE:                                HISTORY & PHYSICAL   CHIEF COMPLAINT:  Right hip pain.   HISTORY OF PRESENT ILLNESS:  This is an 77 year old white female with  history of left intratrochanteric hip fracture in December of last year who  was on rehab for a period time and was admitted with increased hip pain on  June 1.  The patient ultimately underwent a right total hip replacement the  same day.  The patient is partial weightbearing on the right hip.  She  developed postoperative anemia of 8.7 and was transfused 2 units of packed  red blood cells March 08, 2005, with increase in her hemoglobin to 11.2.  The  patient completed Cipro for Citrobacter UTI.  The patient had some mild  mental status changes due to narcotics.  She denies any significant pain.  She has had supplementation of hypokalemia.   REVIEW OF SYSTEMS:  The patient reports some low back pain, reflux,  generalized weakness.  Denies significant right hip pain.  Has been sleeping  well.  She does report constipation.  Has no bladder complaints.  Full  Review of Systems is in the written H&P.   PAST MEDICAL HISTORY:  1.  Positive for left intratrochanteric hip fracture.  2.  Osteoporosis.  3.  Hypertension.  4.  Back surgery.  5.  Left wrist surgery.  6.  Left carpal tunnel release.  7.  Basal cell carcinoma.   History is negative for alcohol or tobacco.   FAMILY HISTORY:  Noncontributory.   SOCIAL HISTORY:  The patient is a resident of [facility].  She was independent with walker prior to admission.   Current functional status reveals patient is at moderate assistance with bed  mobility,  supervision for transfers, supervision for gait 50 feet with  rolling walker.   MEDICATIONS PRIOR TO ADMISSION:  1.  Fosamax 20 mg weekly.  2.  Norvasc 5 mg daily.  3.  Aspirin 81 mg daily.  4.  Multivitamins daily.   ALLERGIES:  DICLOXACILLIN.   LABORATORY DATA:  Hemoglobin 11, white count 7.9.  BUN 7, creatinine 0.6,  sodium 136, potassium 3.4.  Most recent INR is 1.0.   PHYSICAL EXAMINATION:  VITAL SIGNS:  Blood pressure 110/50, pulse 72,  respiratory rate 18, temperature 98.4.  GENERAL:  The patient is pleasant, in no acute distress.  HEENT:  Pupils equal, round, and reactive to light and accommodation.  Extraocular eye movements are intact.  Oral mucosa is pink and moist.  NECK:  Supple without JVD or lymphadenopathy.  CHEST: Clear to auscultation bilaterally without wheezes, rales, or rhonchi.  HEART:  Regular rate and rhythm without murmurs, rubs, or gallops.  ABDOMEN:  Soft, nontender.  Bowel sounds are positive.  NEUROLOGIC:  The  patient is alert and oriented x 3.  Cranial nerves II-XII  intact.  Reflexes are 2+.  Sensation is normal.  Judgment is appropriate.  She is alert and oriented x 3.  Memory is generally intact.  Mood is  appropriate.  Strength is 4 to 4+/5 in both upper extremities.  Right lower  extremity is 2/5 proximally, then 3+ to 4/5 distally.  Left lower extremity  is 4 to 4+/5 throughout.  SKIN:  Generally intact.  Right hip had staples in place with minimal to no  drainage today.   ASSESSMENT:  1.  Functional deficit secondary to wound in right hip status post right      total hip replacement, postoperative day #4 today.  Begin subacute level      rehabilitation with modified independent goals.  Estimated length of      stay is 7 days.  Prognosis is good.  2.  Pain management with p.r.n. Vicodin and Tylenol.  Observe mental status      closely.  3.  Deep vein thrombosis prophylaxis.  Subcutaneous Lovenox 30 mg q.12h.      until INR is greater than  2.0 at which point we will discontinue Loven      os and continue Coumadin.  4.  Postoperative anemia.  Will check CBC, continue iron supplement.  5.  Hypokalemia.  Continue with potassium supplement 20 mEq b.i.d.  6.  Hypertension.  Norvasc.  7.  Depression.  Celexa.       ZTS/MEDQ  D:  03/11/2005  T:  03/11/2005  Job:  [id number]

## 2011-02-22 NOTE — Discharge Summary (Signed)
NAMESHAWNEEN, DEETZ NO.:  [id number]   MEDICAL RECORD NO.:  [id number]          PATIENT TYPE:  ORB   LOCATION:  4522                         FACILITY:  [facility]   PHYSICIAN:  Ranelle Oyster, M.D.DATE OF BIRTH:  01-Apr-1919   DATE OF ADMISSION:  09/17/2004  DATE OF DISCHARGE:  10/06/2004                                 DISCHARGE SUMMARY   DISCHARGE DIAGNOSIS:  1.  Left intertrochanteric hip with compression screw fixation.  2.  Hypertension.  3.  History of depression.  4.  Mild dementia.  5.  Asymmetrically pleural opacities of right apex and posterior left upper      lobe with recommendation for follow up CT scan in three months.   HISTORY OF PRESENT ILLNESS:  Ms. Pikus is an 77 year old female with a  history of balance problems, early Alzheimer's disease, admitted December 8  with fall sustaining left intertrochanteric hip fracture.  She was evaluated  by Dr. Lovell Sheehan and cleared for surgery by [facility].  She  underwent left hip compression screw fixation December 8 by Dr. Cleophas Dunker.  Postoperative, is weight bearing as tolerated and Coumadin for DVT  prophylaxis.  The patient was noted to have postop anemia requiring 2 units  of packed red blood cells.  She continued on antibiotics for fever of  unknown origin.  Therapies were initiated and the patient is total assist  for bed mobility, max total assist 10% for transfers, not able to ambulate  yet.   PAST MEDICAL HISTORY:  Significant for balance problems, history of TB in  1953, constipation, hypertension, lumbar fusion L5-S1, ORIF left wrist  fracture, decreased hearing.   ALLERGIES:  Dicyclomine.   FAMILY HISTORY:  Noncontributory.   SOCIAL HISTORY:  The patient lives in [place] and has been independent.  She has a driver and a maid who helps with house chores.  She does not use  any alcohol or tobacco.  She does have a daughter in [place] and the  plans are for the patient  to be discharged to assisted living facility at  discharge.   HOSPITAL COURSE:  Ms. Ellisa Devivo was admitted to [facility] on September 17, 2004, with [facility] therapies to consist of PT and OT daily.  Prior to  admission, labs checked showed postop anemia to have resolved with H&H of  12.5 and 38.  Check of electrolytes revealed sodium 140, potassium 3.7,  chloride 104, CO2 28, BUN 8, creatinine 0.8, glucose 98.  LFTs revealed ALT  48, AST 39.  Abnormal LFTs were rechecked at a later date on December 15 and  noted to be improving with AST 39, ALT 38, and alkaline phos 122, T-bili at  1.1.  The patient had a chest x-ray done on admission that showed question  of abnormal rib lesions.  Follow up CT of the chest was recommended and this  was done on December 14.  This showed no focal lytic or sclerotic osseous  lesion, multiple nonacute rib fractures, asymmetric pleural opacities in the  right apex and posterior left upper lobe, could be related to scarring but  question of neoplasm.  Recommendation for follow up CT in three  months as  no prior CT of chest available.  Also, 2 cm density lesion upper pole of  right kidney, probably cyst.  The patient's antibiotics were discontinued on  December 13 as UA was negative and the patient was progressing without  difficulty.  The patient's hip incision was noted to be healing well with  moderate edema, this has slowly resolved.  The patient was maintained on  Coumadin throughout her stay with recommendations for Coumadin to continue  through January 8 for DVT prophylaxis.  Dr. Sandria Manly did evaluate the patient  and recommend MRI of the cervical spine for evaluation of gait in next few  weeks.  Also, additional labs drawn include check of vitamin D which were  within normal limits.   The patient's pain control has been reasonable.  She is noted to have some  complaints of back pain and bilateral hip pain secondary to DJD.  P.o.  intake has been good.   Therapies initiated and the patient has been  progressing along well.  The patient currently varies from minimum assist to  close supervision for transfers with verbal cues.  She is at min assist to  close supervision to ambulate 100 feet with rolling walker.  She is at min  assist for bathing, set up to supervision for dressing.  She is at  supervision for toileting.  She is slowly improving.  Secondary to family  concerns, the patient is to be discharged to assisted living with  progressive therapies to continue.  On October 06, 2004, the patient is to  be discharged to assisted living.   DISCHARGE MEDICATIONS:  Coumadin, Celexa 20 mg daily, multi-vitamin one per  day, Os-Cal 1 p.o. b.i.d., Dulcolax suppository 1 q.h.s., Norvasc 7.5 mg per  day, Vicodin 1-2 p.o. q.4-6h. p.r.n. pain.   DISCHARGE INSTRUCTIONS:  Activities:  Weight bearing as tolerated with use  of a walker.  Diet is kosher vegetarian.  Special instructions:  Progressive  PT and OT to continue past discharge.  Continue monitoring Coumadin level  with protime checks routinely.  Coumadin to continue through October 14, 2004.  Follow up with Dr. Cleophas Dunker for postop check in the next weeks,  follow up with LMD for routine medical check, follow up with Dr. Sandria Manly for  routine check, follow up with Dr. Riley Kill as needed.      Pame   PP/MEDQ  D:  10/04/2004  T:  10/04/2004  Job:  [id number]   cc:   Claude Manges. Cleophas Dunker, M.D.  [address] [place]  [place] [address]  Fax: [phone]   Kendrick Fries  [address]  [place]  [place] [address]  Fax: [phone]

## 2011-02-22 NOTE — Consult Note (Signed)
[name], DANIS NO.:  [id number]   MEDICAL RECORD NO.:  [id number]          PATIENT TYPE:  INP   LOCATION:  5002                         FACILITY:  [facility]   PHYSICIAN:  Sherin Quarry, MD      DATE OF BIRTH:  [birth year]   DATE OF CONSULTATION:  03/07/2005  DATE OF DISCHARGE:                                   CONSULTATION   [name] is a pleasant and very alert 77 year old lady whose recent  problems have chiefly been secondary to a left hip fracture that had to be  treated with a compression screw fixation and severe right hip pain for  which she underwent a right total hip replacement today.  In recent days,  [name] indicates that she has been doing well and has had no problems  with chest pain, shortness of breath, abdominal pain, nausea, or vomiting.  Apparently, her immediate postoperative course has been quite stable and she  is not currently experiencing any difficulties.  We have been asked to  follow her in the postoperative period.   ALLERGIES:  The patient is said to be allergic to DICLOXACILLIN.   CURRENT MEDICATIONS:  1.  Fosamax 70 mg weekly.  2.  Norvasc 5 mg daily.  3.  Celexa 40 mg daily.  4.  Ibuprofen 200 mg p.r.n.  5.  Tylenol p.r.n.  6.  Calcium one tablet daily.  7.  Baby aspirin 81 daily.   PAST MEDICAL HISTORY:  1.  Hypertension.  The patient is compliant with her medication and      apparently has had no problems with her blood pressure control recently.  2.  Constipation.  The patient states that she is chronically constipated.      She takes Senokot for this problem but only with limited benefits.  3.  The patient has a longstanding history of balance problems with frequent      falling.  As a result of falls, she has broken her ribs, fractured her      wrist and injured her pelvis.  She was evaluated for this problem by      Genene Churn. Love, M.D. in December of last year.  He recommended that a      cervical spine  MRI scan be done as well as MRI scan of the brain, PET      scan, etc.  These studies were chiefly done in his office apparently.  I      do not know the outcome of this work-up except that apparently, no      serious pathology was identified.  4.  Urinary tract infection.  The patient has no symptoms related to this      problem.   OPERATIONS:  The only operations the patient has had in the past has been a  spinal fusion in 1976, a surgical repair of a left hip fracture in December  of 2005 and the above mentioned total hip replacement.   FAMILY HISTORY:  The patient's mother died as a result of heart problems.  Her father died in an accident.  One sister died as a result of problems  related to diabetes.   SOCIAL HISTORY:  The patient lives a [facility] in an  apartment setting.  She is able to provide for her care quite successfully  and really her main problem recently has been pain related to her hip  problems.  She does not smoke and denies alcohol abuse.  Previously she was  a Designer, jewellery.   REVIEW OF SYSTEMS:  Head:  She denies headache or dizziness.  Eyes:  She  denies visual blurring or diplopia.  Ear, nose and throat: Denies earache,  sinus pain, or sore throat.  Chest:  Denies coughing, wheezing or chest  congestion.  Cardiovascular:  Denies orthopnea, paroxysmal nocturnal dyspnea  or ankle edema.  GI:  Denies nausea, vomiting, abdominal pain.  She is  chronically constipated.  GU:  Denies dysuria or urinary frequency.  Neuro:  See above.  Endocrine:  Denies excessive thirst, urinary frequency or  nocturia.   PHYSICAL EXAMINATION:  The patient was seen in the immediate postoperative  period.  HEENT:  Within normal limits.  CHEST:  Her chest is clear to auscultation and percussion.  BACK:  Examination of the back reveals no CVA or point tenderness.  CARDIOVASCULAR:  Exam reveals normal S1, S2. There are no murmurs, rubs or  gallops.  ABDOMEN:   The abdomen is benign. There are normal bowel sounds without  masses, tenderness or organomegaly.  NEUROLOGIC:  Examination of extremities is normal.   IMPRESSION:  1.  Hip pain status post right total hip replacement.  2.  Left hip fracture, December 2005, status post compression screw      fixation.  3.  Osteoporosis.  4.  Hypertension.  5.  History of spinal fusion.  6.  Records indicate the patient has a history of senile dementia although      she seems to be very alert and able to answer questions without      difficulty.  7.  History of wrist fracture and rib fractures.  8.  Urinary tract infection, Cipro therapy.   Overall, the patient seems to be doing extremely well.  We will follow her  in the postoperative period.  I agree with placing her on Cipro.  Would  suggest that we decrease the dose at this point.  She is on chronic  postoperative Coumadin prophylaxis and Norvasc and Celexa have been resumed.      SY/MEDQ  D:  03/07/2005  T:  03/08/2005  Job:  [id number]   cc:   Hal T. Stoneking, M.D.  [address]  [place], [place] [address]  Fax: [phone]   Claude Manges. Cleophas Dunker, M.D.  [address] [place]  [place] [address]  Fax: [phone]

## 2011-02-22 NOTE — Discharge Summary (Signed)
[name], [name] NO.:  [id number]   MEDICAL RECORD NO.:  [id number]          PATIENT TYPE:  ORB   LOCATION:  4524                         FACILITY:  [facility]   PHYSICIAN:  Ellwood Dense, M.D.   DATE OF BIRTH:  1919/03/12   DATE OF ADMISSION:  03/13/2005  DATE OF DISCHARGE:  03/16/2005                                 DISCHARGE SUMMARY   DISCHARGE DIAGNOSES:  1.  Right total hip arthroplasty March 07, 2005, secondary to osteoarthritis.  2.  Pain control.  3.  Coumadin for deep vein thrombosis prophylaxis.  4.  Postoperative anemia.  5.  Hypokalemia, resolved.  6.  Hypertension.  7.  Depression.   HISTORY OF PRESENT ILLNESS:  An 77 year old white female, history of a left  intertrochanteric hip fracture December 2005 with pinning, which she  received inpatient rehab services for, now admitted March 07, 2005, with  advanced right hip changes secondary to osteoarthritis.  Underwent a right  total hip arthroplasty June 1 per Dr. Cleophas Dunker.  Placed on Coumadin for  deep vein thrombosis prophylaxis, partial weightbearing.  Postoperative  anemia, 8.7, transfused two units of packed red blood cells June 1 with a  hemoglobin improved to 11.2.  Preoperative urinary tract infection of  Citrobacter treated with Cipro.  Mild mental status changes.  Cranial CT  scan June 4 was negative.  Her narcotics were adjusted due to her confusion.  Venous Doppler studies of the lower extremities March 13, 2005, negative.  Bouts of hypokalemia, 2.8, improved with supplement to 3.4.   PAST MEDICAL HISTORY:  See discharge diagnoses.  No alcohol, no tobacco.   SOCIAL HISTORY:  Resident of [facility] assisted living facility.   ALLERGIES:  DICLOXACILLIN.   MEDICATIONS PRIOR TO ADMISSION:  Fosamax, Norvasc, aspirin 81 mg daily, and  a multivitamin.   HOSPITAL COURSE:  Patient with progressive gains while on rehab services  with therapies initiated on a daily basis.  The following issues  are  followed during the patient's rehab course.   Pertaining to Ms. Kilgallon's right total hip arthroplasty, surgical site  healing nicely.  She was partial weightbearing with hip precautions.  It was  advised per follow-up of orthopedic services that her staples were to be  removed prior to discharge home and apply Steri-Strips.  She continued on  Vicodin for pain control.  Her mental status remained stable.  Noted early  on mental status changes on the acute side of the hospital, with cranial CT  scan negative.  Coumadin for deep vein thrombosis prophylaxis, no bleeding  episodes.  She will complete Coumadin protocol, followed by [facility] home  health agency, and resume her aspirin therapy after Coumadin completed.  Postoperative anemia with transfusion stable, latest hemoglobin of 10.7.  Blood pressures controlled with Norvasc.  She had no orthostatic changes.  She was maintained on Celexa for depression.  Her mood remained upbeat  throughout her rehab course.  Hypokalemia resolved at 3.7.  Functionally,  she was minimal assist for bed mobility and transfers, ambulating contact  guard assist, simple set-up for activities of daily living except lower  body  dressing.  She was discharged to home with home health therapies.   DISCHARGE MEDICATIONS AT TIME OF DICTATION:  1.  Coumadin with latest dose of 7.5 mg daily, to be completed on April 06, 2005.  2.  Norvasc 5 mg daily.  3.  Celexa 40 mg daily.  4.  Os-Cal 500 mg daily.  5.  Vicodin as needed for pain.   ACTIVITY:  Partial weightbearing with hip precautions.   DIET:  Regular.   WOUND CARE:  Cleanse incision daily with warm soap and water.  Call Dr.  Cleophas Dunker, [phone], if any increased redness, drainage or fever.   Home health nurse per Genevieve Norlander, check INR on Monday, March 18, 2005.  Home  health physical/occupational therapy is advised.      Danie   DA/MEDQ  D:  03/15/2005  T:  03/15/2005  Job:  [id number]   cc:    Claude Manges. Cleophas Dunker, M.D.  [address] [place]  [place] [address]  Fax: [phone]   Hal T. Stoneking, M.D.  [address] [place], [place] [address]  Fax: [phone]

## 2011-02-22 NOTE — Discharge Summary (Signed)
NAMEMONALISA, BAYLESS NO.:  [id number]   MEDICAL RECORD NO.:  [id number]          PATIENT TYPE:  INP   LOCATION:  5002                         FACILITY:  [facility]   PHYSICIAN:  Claude Manges. Whitfield, M.D.DATE OF BIRTH:  1918-11-04   DATE OF ADMISSION:  03/07/2005  DATE OF DISCHARGE:  03/11/2005                                 DISCHARGE SUMMARY   ADMISSION DIAGNOSES:  1.  Hypertension.  2.  Osteoporosis.  3.  Urinary tract infection on Cipro.  4.  Right hip osteoarthritis.   DISCHARGE DIAGNOSIS:  1.  Right total hip arthroplasty.  2.  Postoperative blood loss anemia.  3.  Postoperative hypokalemia.  4.  History of hypertension.  5.  History of osteoporosis.   HISTORY OF PRESENT ILLNESS:  The patient is an 77 year old female with  history of left hip intertrochanteric fracture in 2005, repaired with ORIF  with good results.  The patient has been having problems with her right hip  for over several years with instability, falling and pain she describes as a  deep aching sensation that worsens with weightbearing.  It is located deep  within the groin around into the buttocks and radiates down to the knee.  She does have night pain.  She does have difficulty ambulating.  She is  currently walking with a walker.  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   ALLERGIES:  DICLOXACILLIN.   CURRENT MEDICATIONS:  1.  Fosamax 70 mg p.o. each week.  2.  Norvasc 5 mg p.o. daily.  3.  Citalopram 40 mg p.o. daily.  4.  Ibuprofen 200 mg p.r.n.  5.  Tylenol p.r.n.  6.  Glucosamine with MSM one tablet p.o. daily.  7.  Calcium 600 mg p.o. daily.  8.  Aspirin 81 mg p.o. daily.   SURGICAL PROCEDURE:  On June 1, the patient was taken to the OR by Claude Manges.  Cleophas Dunker, M.D., assisted by Lenard Galloway. Chaney Malling, M.D., and Jamelle Rushing,  P.A.  Under spinal anesthesia, the patient underwent a right total hip  arthroplasty without any complications.  She had the following  components  implanted:  A size 13.5 small-stature stem, a size 54 mm acetabular cup, a  54 mm outside diameter, 32 mm inside diameter acetabular lining, apex hole  eliminator, a size 6.5 mm, 20 mm length cancellous screw through the  acetabular cup, and a size 32 mm, +9 femoral ball.  The patient tolerated  the procedure well, was transferred to the recovery room and then to the  orthopedic floor for routine total hip protocol.   CONSULTATIONS:  The following routine consults were requested:  Physical  therapy, occupational therapy, case management.   An [facility] consult was also requested for evaluation of her  multiple medical issues during her hospitalization.   HOSPITAL COURSE:  On June 1, the patient was admitted to [facility]  under the care of Dr. Norlene Campbell.  The patient was taken to the OR,  where right total hip arthroplasty was performed without any complications.  The patient was transferred to the recovery  room and then to the orthopedic  floor for routine total hip protocol.  She was placed on heparin and  Coumadin for DVT prophylaxis, IV antibiotics and pain medicines.  Postop  check noted that the patient's right hip wound dressings were soaked through  with blood, so Dr. Cleophas Dunker decided to type and cross her and transfuse two  units of packed red blood cells.  The patient received this without any  complications or any untoward events, and her hemoglobin improved the  following day to 12.0.  The patient also developed some postoperative  hypokalemia, and this was treated with p.o. replacement over several days.  Otherwise, the patient worked fairly well for her age and frail ability with  physical therapy.  She was on Cipro for a UTI preop, and she was continued  postop.  Her wound remained benign for any signs of infection.  Her leg  remained neuromotorvascularly intact.  The patient was able to transition  from IV medications and antibiotics  to p.o. without any difficulty.  The  patient had Dopplers of her lower extremities on postop day #4, which were  negative.  She had her Cipro discontinued by [facility] on postop day  #4, and at this point she was felt to be stable and orthopedically ready for  discharge to the subacute care unit for continued rehab prior to being  discharged to home, so arrangements were made and she was discharged in good  condition.   LABORATORY DATA:  CBC on June 5:  WBC 7.9, hemoglobin 11.2, hematocrit 32.3,  platelets 300,000.  INR of 1.0.  Doppler was negative for any signs of DVT  or superficial thrombus.  Routine chemistries on June 3:  Sodium 136,  potassium 3.4, up from a low of 2.8, glucose 99, BUN 7, creatinine 0.6.  Routine urinalysis on June 1 was cloudy, small hemoglobin, many bacteria,  large leukocyte esterase.  She was found greater than 100,000 of  Citrobacter.  EKG on May 25 was normal sinus rhythm at 62 beats per minute.   MEDICATIONS UPON DISCHARGE FROM ORTHOPEDICS FLOOR:  1.  Colace 100 mg p.o. b.i.d.  2.  Trinsicon one tablet p.o. t.i.d.  3.  Norvasc 5 mg p.o. daily.  4.  Celexa 40 mg p.o. daily.  5.  Calcium carbonate 500 mg p.o. daily.  6.  Cipro 250 mg p.o. b.i.d. and discontinued on this date by [facility].  7.  Laxative and enema of choice p.r.n.  8.  Potassium chloride 20 mEq p.o. t.i.d.  9.  Phenergan 25 mg p.o. q.6h. p.r.n.  10. Tylenol 650 mg p.o. q.4h. p.r.n.  11. Robaxin 500 mg p.o. q.6h. p.r.n.  12. Vicodin one or two tablets every four to six hours p.r.n.  13. Coumadin 7.5 mg p.o. daily.   DISCHARGE INSTRUCTIONS:  1.  Medications:  The patient is to continue with the current medications as      dispensed on the ortho floor.  Coumadin should be dosed according for an      INR of 2-2.5 for a total of 30 days.  2.  Cipro was discontinued on this date by [facility]. 3.  Activity:  The patient is to maintain 50% weightbearing with the  use of      a walker with physical therapy and when out of bed.  The patient is to      have a long-leg immobilizer on for right leg when in bed.  4.  Wound should be checked daily for any signs of infections.  If the      patient is in the hospital on postop day #4, she can have staples      removed and Steri-Strips applied.  If not, we will see her in the office      for removal of staples.  5.  Diet:  No restrictions.  6.  Follow-up:  The patient needs a follow-up appointment with Dr. Cleophas Dunker      one week from discharge from rehab services.  Please instruct the      patient to call for an appointment, [phone].   The patient's condition upon discharge to rehab services improved and good.       RWK/MEDQ  D:  03/11/2005  T:  03/11/2005  Job:  [id number]

## 2011-02-22 NOTE — Op Note (Signed)
[name], [name] NO.:  [id number]   MEDICAL RECORD NO.:  [id number]          PATIENT TYPE:  INP   LOCATION:  5023                         FACILITY:  [facility]   PHYSICIAN:  Claude Manges. Whitfield, M.D.DATE OF BIRTH:  November 07, 1918   DATE OF PROCEDURE:  09/13/2004  DATE OF DISCHARGE:                                 OPERATIVE REPORT   PREOPERATIVE DIAGNOSIS:  Nondisplaced basilar neck/intertrochanteric  fracture, left hip.   POSTOPERATIVE DIAGNOSIS:  Nondisplaced basilar neck/intertrochanteric  fracture, left hip.   PROCEDURE:  Compression screw fixation.   SURGEON:  Claude Manges. Cleophas Dunker, M.D.   ANESTHESIA:  General orotracheal.   COMPLICATIONS:  None.   PROCEDURE:  With the patient comfortable on the operating table and under  general orotracheal anesthesia, a Foley catheter was inserted by the nursing  staff and a urine specimen was obtained for UA and culture and sensitivity.  The patient was then transported to the fracture table without difficulty.  The right lower extremity was placed in 90/90 and well-padded.  The left  lower extremity was padded and placed in slight distal traction in neutral  position.  Image intensification revealed a nondisplaced fracture of the  basal neck and intertrochanteric region.  The left hip was then prepped with  DuraPrep from the iliac crest circumferentially to below the knee.  Sterile  draping was performed.   A longitudinal incision was made in the mid-lateral thigh beginning at the  base of the greater trochanter and extending distally approximately three  inches.  Via sharp dissection the incision was carried down through the  subcutaneous tissue, small bleeders Bovie coagulated.  A self-retaining  retractor was inserted.  Adipose tissue was incised with the Bovie.  The  iliotibial band was identified and incised along the length of the skin  incision.  The self-retaining retractors were placed more deeply.  Any  bleeding was eliminated with the Bovie.  The vastus lateralis fascia was  identified, incised along the length of the skin incision.  The vastus  lateralis muscle fibers were then teased from its fascia posteriorly.  A  Bennett retractor was then placed over the proximal femur.  The Ace  cannulated system was utilized.  The guide pin was then placed at a 135  degree angle just inferior to the midline of the femoral neck and head,  noted to be in excellent position in both the AP and lateral projections.  I  measured about a 77 mm screw and subtracted 5 mm and thus used the step-cut  reamer to 70 mm.  I checked to be sure I was in good position in both AP and  lateral projections.  The regular threaded titanium screw was then placed  over the guide pin in excellent position in both AP and lateral projections.  The keyless 135 degree four-hole side plate was then placed over the  threaded screw and maintained along the lateral proximal femur with a Lowman  bone clamp.  Each of the four holes were subsequently drilled, measured, and  filled with self-tapping titanium screws.  He had excellent fixation on each  of the screws, i.e., nice and tight.  Traction was released distally.  Set  screw was inserted.  I had excellent compression across the fracture site as  I checked it in both AP and lateral projections with the image intensifier.  The wound was then copiously irrigated with saline solution.  I had a nice,  dry field.  The vastus lateralis fascia was then closed anatomically with a  running 0 Vicryl, the iliotibial band closed with the same material, the  subcu was closed in two layers with 2-0 Vicryl.  The skin was closed with a  running 3-0 subcuticular Prolene with Steri-Strips over Benzoin.  A sterile  bulky dressing was applied and the patient was then returned to the  postanesthesia recovery room in satisfactory condition.      Cindee Lame   PWW/MEDQ  D:  09/13/2004  T:  09/14/2004   Job:  [id number]

## 2011-02-22 NOTE — Op Note (Signed)
NAMEMURIELLE, STANG              ACCOUNT NO.:  [id number]   MEDICAL RECORD NO.:  [id number]          PATIENT TYPE:  INP   LOCATION:  2550                         FACILITY:  [facility]   PHYSICIAN:  Claude Manges. Whitfield, M.D.DATE OF BIRTH:  [birth year]   DATE OF PROCEDURE:  03/07/2005  DATE OF DISCHARGE:                                 OPERATIVE REPORT   PREOPERATIVE DIAGNOSIS:  Osteoarthritis right hip.   POSTOPERATIVE DIAGNOSIS:  Osteoarthritis right hip.   PROCEDURE:  Right total hip replacement.   SURGEON:  Claude Manges. Cleophas Dunker, M.D.   ASSISTANTS:  1.  Lenard Galloway. Chaney Malling, M.D.  2.  Jamelle Rushing, P.A.-C.   ANESTHESIA:  Spinal.   COMPLICATIONS:  None.   COMPONENTS:  DePuy AML small stature 13.5 mm femoral component, 32 mm hip  ball, with a +9 mm neck length, a Pinnacle Sector 2 outer diameter  acetabular cup, apex eliminator, a Pinnacle Marathon 10-degree acetabular  liner lipped, and a single cancellous bone screw 6.5 mm in diameter, 20 mm  in length.   PROCEDURE:  With the patient comfortable on the operating room table, Dr.  Krista Blue performed spinal anesthesia.  The patient was quite comfortable, then  placed in the lateral decubitus position, with the right side up.  The  patient was secured to the operating room table with Innomed hip system.  The right hip was then prepped with Betadine scrub and  DuraPrep from the  iliac crest to the ankle.  Sterile draping was performed.   A routine southern incision was utilized and by sharp dissection carried  down to the subcutaneous tissue.  Adipose tissue was incised with a Bovie to  the level of the iliotibial band.  The iliotibial band was then incised the  length of the skin incision.  Self-retaining retractors were inserted. We  had a very nice dry field.   With the hip internally rotated, the short external rotators were identified  and incised at their attachment at the posterior aspect of the greater  trochanter.   Tendinous structures were tagged with 0 Tycron suture.  The  capsule was easily identified and carefully incised along the femoral neck  and head to the level of the acetabulum.  There was a clear yellow joint  effusion probably 4-5 cc in volume.  The head was then dislocated  posteriorly.  It was completely devoid of articular cartilage and misshapen.  There was a moderate amount of reddish synovial tissue consistent with  considerable inflammatory response.   The AML calcar guide was used to osteotomize the femoral neck.  A starter  hole was then made in the pyriformis fossa.  Reaming was performed to 13 mm  to accept a 13.5 mm component, with good end osteal abrasion with the  reamer.  This was the templated prosthesis predominantly.  The sequential  rasping of the femoral canal was then performed with a 10.5, 12, and then a  13.5 mm small stature component.  We had a very nice fit, and the calcar  reamer was used to obtain the appropriate calcar angle.  Rasp was then  removed.  Retractor was then placed about the acetabulum.  There was  considerable synovitis which was debrided, and a very large labrum which was  also excised.  There was a fair amount of extraneous capsule material which  was also excised.  The head was misshapen, and there appeared to be some  erosions superiorly, and also a very shallow acetabulum.  Reaming was  performed to medialize the acetabulum to a level of 54 mm outer diameter.  The last reaming was 53 mm.  We had very nice circumferential bleeding and  good bone stock circumferentially.  We trialed severe acetabular components  and felt that the 54 would be an excellent fit with very nice rim fit.  It  would not completely seat.  Accordingly, the 54 mm outer diameter 100 series  Sector 2 acetabular component was impacted.  Because there was a small void  in the superior acetabulum, we elected to use one of the superior lateral  screw holes.  This was drilled  to 20 mm depth.  The depth gauge was  inserted, and we were still within bone.  A 6.5 mm screw was then inserted,  with excellent purchase.  This was followed by the apex hole eliminator.  The trial acetabular polyethylene component was then impacted, followed by  the 13.5 mm femoral component.  We trialed several neck lengths and felt  that the +9 equalized the leg lengths and also gave [place] excellent stability.  We also tried the increased anteverted neck and felt that this did not  provide any more stability.   The trial components were removed.  The joint was copiously irrigated with  saline solution.  The final Marathon polyethylene component was then  impacted, followed by the final femoral component which was compacted flush  on the calcar.  We again trialed the femoral neck and again felt that the 32  mm hip ball with the +9 neck length provided perfect stability in flexion,  extension, internal and external rotation, and equalized leg lengths.   The wound was again irrigated with saline solution.  The Morse tapered head  and neck was cleaned and dried, and then the final 32 mm hip ball with a +9  mm neck length was impacted.  The entire construct was reduced and again  through a full range of motion we had excellent stability.   The wound was again irrigated with saline solution.  The capsule was closed  anatomically with #1 Ethibond.  We had a very nice closure with excellent  capsule.  The wound was again irrigated with saline solution.  The  iliotibial band was closed anatomically with a running 0 Vicryl, the  subcutaneous closed in several layers with 2-0 Vicryl, the skin closed with  skin clips.  Sterile bulky dressing was applied.  The patient was then  placed in the supine position and transferred to the operating room  stretcher without a problem.  She was then transferred to postanesthesia  recovery care in stable condition.      PWW/MEDQ  D:  03/07/2005  T:   03/07/2005  Job:  [id number]

## 2012-02-04 DIAGNOSIS — K625 Hemorrhage of anus and rectum: Secondary | ICD-10-CM | POA: Diagnosis not present

## 2012-02-10 DIAGNOSIS — K625 Hemorrhage of anus and rectum: Secondary | ICD-10-CM | POA: Diagnosis not present

## 2012-02-10 DIAGNOSIS — K621 Rectal polyp: Secondary | ICD-10-CM | POA: Diagnosis not present

## 2012-02-10 DIAGNOSIS — K921 Melena: Secondary | ICD-10-CM | POA: Diagnosis not present

## 2012-02-10 DIAGNOSIS — K648 Other hemorrhoids: Secondary | ICD-10-CM | POA: Diagnosis not present

## 2012-02-10 DIAGNOSIS — D126 Benign neoplasm of colon, unspecified: Secondary | ICD-10-CM | POA: Diagnosis not present

## 2012-02-10 DIAGNOSIS — D129 Benign neoplasm of anus and anal canal: Secondary | ICD-10-CM | POA: Diagnosis not present

## 2012-04-16 DIAGNOSIS — I10 Essential (primary) hypertension: Secondary | ICD-10-CM | POA: Diagnosis not present

## 2012-09-10 DIAGNOSIS — H26499 Other secondary cataract, unspecified eye: Secondary | ICD-10-CM | POA: Diagnosis not present

## 2012-09-12 DIAGNOSIS — N3000 Acute cystitis without hematuria: Secondary | ICD-10-CM | POA: Diagnosis not present

## 2012-09-12 DIAGNOSIS — R112 Nausea with vomiting, unspecified: Secondary | ICD-10-CM | POA: Diagnosis not present

## 2013-04-19 DIAGNOSIS — K5900 Constipation, unspecified: Secondary | ICD-10-CM | POA: Diagnosis not present

## 2013-04-19 DIAGNOSIS — Z1331 Encounter for screening for depression: Secondary | ICD-10-CM | POA: Diagnosis not present

## 2013-04-19 DIAGNOSIS — I10 Essential (primary) hypertension: Secondary | ICD-10-CM | POA: Diagnosis not present

## 2013-06-30 DIAGNOSIS — M6281 Muscle weakness (generalized): Secondary | ICD-10-CM | POA: Diagnosis not present

## 2013-06-30 DIAGNOSIS — R262 Difficulty in walking, not elsewhere classified: Secondary | ICD-10-CM | POA: Diagnosis not present

## 2013-06-30 DIAGNOSIS — R279 Unspecified lack of coordination: Secondary | ICD-10-CM | POA: Diagnosis not present

## 2013-07-02 DIAGNOSIS — R279 Unspecified lack of coordination: Secondary | ICD-10-CM | POA: Diagnosis not present

## 2013-07-02 DIAGNOSIS — M6281 Muscle weakness (generalized): Secondary | ICD-10-CM | POA: Diagnosis not present

## 2013-07-02 DIAGNOSIS — R262 Difficulty in walking, not elsewhere classified: Secondary | ICD-10-CM | POA: Diagnosis not present

## 2013-07-06 DIAGNOSIS — R279 Unspecified lack of coordination: Secondary | ICD-10-CM | POA: Diagnosis not present

## 2013-07-06 DIAGNOSIS — R262 Difficulty in walking, not elsewhere classified: Secondary | ICD-10-CM | POA: Diagnosis not present

## 2013-07-06 DIAGNOSIS — M6281 Muscle weakness (generalized): Secondary | ICD-10-CM | POA: Diagnosis not present

## 2013-07-08 DIAGNOSIS — M6281 Muscle weakness (generalized): Secondary | ICD-10-CM | POA: Diagnosis not present

## 2013-07-08 DIAGNOSIS — R279 Unspecified lack of coordination: Secondary | ICD-10-CM | POA: Diagnosis not present

## 2013-07-08 DIAGNOSIS — R262 Difficulty in walking, not elsewhere classified: Secondary | ICD-10-CM | POA: Diagnosis not present

## 2013-08-09 DIAGNOSIS — R262 Difficulty in walking, not elsewhere classified: Secondary | ICD-10-CM | POA: Diagnosis not present

## 2013-08-09 DIAGNOSIS — R279 Unspecified lack of coordination: Secondary | ICD-10-CM | POA: Diagnosis not present

## 2013-08-09 DIAGNOSIS — M6281 Muscle weakness (generalized): Secondary | ICD-10-CM | POA: Diagnosis not present

## 2013-08-24 DIAGNOSIS — D485 Neoplasm of uncertain behavior of skin: Secondary | ICD-10-CM | POA: Diagnosis not present

## 2013-08-24 DIAGNOSIS — L821 Other seborrheic keratosis: Secondary | ICD-10-CM | POA: Diagnosis not present

## 2013-08-24 DIAGNOSIS — C44519 Basal cell carcinoma of skin of other part of trunk: Secondary | ICD-10-CM | POA: Diagnosis not present

## 2013-08-24 DIAGNOSIS — Z85828 Personal history of other malignant neoplasm of skin: Secondary | ICD-10-CM | POA: Diagnosis not present

## 2013-08-24 DIAGNOSIS — D2360 Other benign neoplasm of skin of unspecified upper limb, including shoulder: Secondary | ICD-10-CM | POA: Diagnosis not present

## 2013-08-24 DIAGNOSIS — I781 Nevus, non-neoplastic: Secondary | ICD-10-CM | POA: Diagnosis not present

## 2013-08-24 DIAGNOSIS — L570 Actinic keratosis: Secondary | ICD-10-CM | POA: Diagnosis not present

## 2013-09-07 DIAGNOSIS — R262 Difficulty in walking, not elsewhere classified: Secondary | ICD-10-CM | POA: Diagnosis not present

## 2013-09-07 DIAGNOSIS — M6281 Muscle weakness (generalized): Secondary | ICD-10-CM | POA: Diagnosis not present

## 2013-09-07 DIAGNOSIS — R279 Unspecified lack of coordination: Secondary | ICD-10-CM | POA: Diagnosis not present

## 2013-09-08 DIAGNOSIS — M6281 Muscle weakness (generalized): Secondary | ICD-10-CM | POA: Diagnosis not present

## 2013-09-08 DIAGNOSIS — H903 Sensorineural hearing loss, bilateral: Secondary | ICD-10-CM | POA: Diagnosis not present

## 2013-09-08 DIAGNOSIS — R262 Difficulty in walking, not elsewhere classified: Secondary | ICD-10-CM | POA: Diagnosis not present

## 2013-09-08 DIAGNOSIS — H6980 Other specified disorders of Eustachian tube, unspecified ear: Secondary | ICD-10-CM | POA: Diagnosis not present

## 2013-09-08 DIAGNOSIS — H7200 Central perforation of tympanic membrane, unspecified ear: Secondary | ICD-10-CM | POA: Diagnosis not present

## 2013-09-08 DIAGNOSIS — R279 Unspecified lack of coordination: Secondary | ICD-10-CM | POA: Diagnosis not present

## 2013-09-08 DIAGNOSIS — H6520 Chronic serous otitis media, unspecified ear: Secondary | ICD-10-CM | POA: Diagnosis not present

## 2013-09-14 DIAGNOSIS — R262 Difficulty in walking, not elsewhere classified: Secondary | ICD-10-CM | POA: Diagnosis not present

## 2013-09-14 DIAGNOSIS — M6281 Muscle weakness (generalized): Secondary | ICD-10-CM | POA: Diagnosis not present

## 2013-09-14 DIAGNOSIS — R279 Unspecified lack of coordination: Secondary | ICD-10-CM | POA: Diagnosis not present

## 2013-09-17 DIAGNOSIS — R279 Unspecified lack of coordination: Secondary | ICD-10-CM | POA: Diagnosis not present

## 2013-09-17 DIAGNOSIS — M6281 Muscle weakness (generalized): Secondary | ICD-10-CM | POA: Diagnosis not present

## 2013-09-17 DIAGNOSIS — R262 Difficulty in walking, not elsewhere classified: Secondary | ICD-10-CM | POA: Diagnosis not present

## 2013-09-22 DIAGNOSIS — M6281 Muscle weakness (generalized): Secondary | ICD-10-CM | POA: Diagnosis not present

## 2013-09-22 DIAGNOSIS — R279 Unspecified lack of coordination: Secondary | ICD-10-CM | POA: Diagnosis not present

## 2013-09-22 DIAGNOSIS — R262 Difficulty in walking, not elsewhere classified: Secondary | ICD-10-CM | POA: Diagnosis not present

## 2013-09-24 DIAGNOSIS — M6281 Muscle weakness (generalized): Secondary | ICD-10-CM | POA: Diagnosis not present

## 2013-09-24 DIAGNOSIS — R262 Difficulty in walking, not elsewhere classified: Secondary | ICD-10-CM | POA: Diagnosis not present

## 2013-09-24 DIAGNOSIS — R279 Unspecified lack of coordination: Secondary | ICD-10-CM | POA: Diagnosis not present

## 2013-09-28 DIAGNOSIS — R262 Difficulty in walking, not elsewhere classified: Secondary | ICD-10-CM | POA: Diagnosis not present

## 2013-09-28 DIAGNOSIS — R279 Unspecified lack of coordination: Secondary | ICD-10-CM | POA: Diagnosis not present

## 2013-09-28 DIAGNOSIS — M6281 Muscle weakness (generalized): Secondary | ICD-10-CM | POA: Diagnosis not present

## 2013-10-01 DIAGNOSIS — R279 Unspecified lack of coordination: Secondary | ICD-10-CM | POA: Diagnosis not present

## 2013-10-01 DIAGNOSIS — R262 Difficulty in walking, not elsewhere classified: Secondary | ICD-10-CM | POA: Diagnosis not present

## 2013-10-01 DIAGNOSIS — M6281 Muscle weakness (generalized): Secondary | ICD-10-CM | POA: Diagnosis not present

## 2013-10-05 DIAGNOSIS — M6281 Muscle weakness (generalized): Secondary | ICD-10-CM | POA: Diagnosis not present

## 2013-10-05 DIAGNOSIS — R262 Difficulty in walking, not elsewhere classified: Secondary | ICD-10-CM | POA: Diagnosis not present

## 2013-10-05 DIAGNOSIS — R279 Unspecified lack of coordination: Secondary | ICD-10-CM | POA: Diagnosis not present

## 2013-10-11 DIAGNOSIS — R279 Unspecified lack of coordination: Secondary | ICD-10-CM | POA: Diagnosis not present

## 2013-10-11 DIAGNOSIS — M6281 Muscle weakness (generalized): Secondary | ICD-10-CM | POA: Diagnosis not present

## 2013-10-11 DIAGNOSIS — R262 Difficulty in walking, not elsewhere classified: Secondary | ICD-10-CM | POA: Diagnosis not present

## 2013-10-20 DIAGNOSIS — H903 Sensorineural hearing loss, bilateral: Secondary | ICD-10-CM | POA: Diagnosis not present

## 2013-10-20 DIAGNOSIS — H7200 Central perforation of tympanic membrane, unspecified ear: Secondary | ICD-10-CM | POA: Diagnosis not present

## 2013-10-20 DIAGNOSIS — H6980 Other specified disorders of Eustachian tube, unspecified ear: Secondary | ICD-10-CM | POA: Diagnosis not present

## 2013-10-21 DIAGNOSIS — H26499 Other secondary cataract, unspecified eye: Secondary | ICD-10-CM | POA: Diagnosis not present

## 2013-10-26 DIAGNOSIS — C44519 Basal cell carcinoma of skin of other part of trunk: Secondary | ICD-10-CM | POA: Diagnosis not present

## 2014-01-20 ENCOUNTER — Other Ambulatory Visit: Payer: Self-pay | Admitting: Orthopedic Surgery

## 2014-01-20 ENCOUNTER — Ambulatory Visit
Admission: RE | Admit: 2014-01-20 | Discharge: 2014-01-20 | Disposition: A | Discharge status: Home / Self Care | Payer: Medicare Other | Source: Ambulatory Visit | Attending: Orthopedic Surgery | Admitting: Orthopedic Surgery

## 2014-01-20 DIAGNOSIS — W19XXXA Unspecified fall, initial encounter: Secondary | ICD-10-CM

## 2014-01-20 DIAGNOSIS — M25551 Pain in right hip: Secondary | ICD-10-CM

## 2014-01-20 DIAGNOSIS — S79919A Unspecified injury of unspecified hip, initial encounter: Secondary | ICD-10-CM | POA: Diagnosis not present

## 2014-01-20 DIAGNOSIS — S79929A Unspecified injury of unspecified thigh, initial encounter: Secondary | ICD-10-CM | POA: Diagnosis not present

## 2014-01-20 DIAGNOSIS — M79609 Pain in unspecified limb: Secondary | ICD-10-CM | POA: Diagnosis not present

## 2014-01-20 DIAGNOSIS — M25559 Pain in unspecified hip: Secondary | ICD-10-CM | POA: Diagnosis not present

## 2014-01-20 DIAGNOSIS — IMO0002 Reserved for concepts with insufficient information to code with codable children: Secondary | ICD-10-CM | POA: Diagnosis not present

## 2014-02-01 ENCOUNTER — Other Ambulatory Visit: Payer: Self-pay | Admitting: Orthopaedic Surgery

## 2014-02-01 ENCOUNTER — Ambulatory Visit
Admission: RE | Admit: 2014-02-01 | Discharge: 2014-02-01 | Disposition: A | Discharge status: Home / Self Care | Payer: Medicare Other | Source: Ambulatory Visit | Attending: Orthopaedic Surgery | Admitting: Orthopaedic Surgery

## 2014-02-01 DIAGNOSIS — M25551 Pain in right hip: Secondary | ICD-10-CM

## 2014-02-01 DIAGNOSIS — S72109A Unspecified trochanteric fracture of unspecified femur, initial encounter for closed fracture: Secondary | ICD-10-CM | POA: Diagnosis not present

## 2014-04-20 DIAGNOSIS — R269 Unspecified abnormalities of gait and mobility: Secondary | ICD-10-CM | POA: Diagnosis not present

## 2014-04-20 DIAGNOSIS — I10 Essential (primary) hypertension: Secondary | ICD-10-CM | POA: Diagnosis not present

## 2014-10-18 DIAGNOSIS — L720 Epidermal cyst: Secondary | ICD-10-CM | POA: Diagnosis not present

## 2014-10-18 DIAGNOSIS — C44519 Basal cell carcinoma of skin of other part of trunk: Secondary | ICD-10-CM | POA: Diagnosis not present

## 2014-10-18 DIAGNOSIS — L570 Actinic keratosis: Secondary | ICD-10-CM | POA: Diagnosis not present

## 2014-10-31 DIAGNOSIS — H903 Sensorineural hearing loss, bilateral: Secondary | ICD-10-CM | POA: Diagnosis not present

## 2014-10-31 DIAGNOSIS — H6983 Other specified disorders of Eustachian tube, bilateral: Secondary | ICD-10-CM | POA: Diagnosis not present

## 2014-10-31 DIAGNOSIS — H6522 Chronic serous otitis media, left ear: Secondary | ICD-10-CM | POA: Diagnosis not present

## 2014-10-31 DIAGNOSIS — H7201 Central perforation of tympanic membrane, right ear: Secondary | ICD-10-CM | POA: Diagnosis not present

## 2014-11-23 DIAGNOSIS — H903 Sensorineural hearing loss, bilateral: Secondary | ICD-10-CM | POA: Diagnosis not present

## 2014-11-23 DIAGNOSIS — H7201 Central perforation of tympanic membrane, right ear: Secondary | ICD-10-CM | POA: Diagnosis not present

## 2014-11-23 DIAGNOSIS — H6522 Chronic serous otitis media, left ear: Secondary | ICD-10-CM | POA: Diagnosis not present

## 2014-11-23 DIAGNOSIS — H6983 Other specified disorders of Eustachian tube, bilateral: Secondary | ICD-10-CM | POA: Diagnosis not present

## 2015-01-02 DIAGNOSIS — H26493 Other secondary cataract, bilateral: Secondary | ICD-10-CM | POA: Diagnosis not present

## 2015-01-05 DIAGNOSIS — H903 Sensorineural hearing loss, bilateral: Secondary | ICD-10-CM | POA: Diagnosis not present

## 2015-01-05 DIAGNOSIS — H7201 Central perforation of tympanic membrane, right ear: Secondary | ICD-10-CM | POA: Diagnosis not present

## 2015-01-05 DIAGNOSIS — H6983 Other specified disorders of Eustachian tube, bilateral: Secondary | ICD-10-CM | POA: Diagnosis not present

## 2015-05-02 DIAGNOSIS — I10 Essential (primary) hypertension: Secondary | ICD-10-CM | POA: Diagnosis not present

## 2015-05-02 DIAGNOSIS — M1990 Unspecified osteoarthritis, unspecified site: Secondary | ICD-10-CM | POA: Diagnosis not present

## 2015-05-02 DIAGNOSIS — K5900 Constipation, unspecified: Secondary | ICD-10-CM | POA: Diagnosis not present

## 2015-05-26 DIAGNOSIS — C44311 Basal cell carcinoma of skin of nose: Secondary | ICD-10-CM | POA: Diagnosis not present

## 2015-05-26 DIAGNOSIS — D485 Neoplasm of uncertain behavior of skin: Secondary | ICD-10-CM | POA: Diagnosis not present

## 2015-05-26 DIAGNOSIS — C44319 Basal cell carcinoma of skin of other parts of face: Secondary | ICD-10-CM | POA: Diagnosis not present

## 2015-07-20 DIAGNOSIS — C44319 Basal cell carcinoma of skin of other parts of face: Secondary | ICD-10-CM | POA: Diagnosis not present

## 2015-07-20 DIAGNOSIS — L905 Scar conditions and fibrosis of skin: Secondary | ICD-10-CM | POA: Diagnosis not present

## 2015-07-20 DIAGNOSIS — L853 Xerosis cutis: Secondary | ICD-10-CM | POA: Diagnosis not present

## 2015-07-20 DIAGNOSIS — C44311 Basal cell carcinoma of skin of nose: Secondary | ICD-10-CM | POA: Diagnosis not present

## 2015-08-23 DIAGNOSIS — L821 Other seborrheic keratosis: Secondary | ICD-10-CM | POA: Diagnosis not present

## 2015-08-23 DIAGNOSIS — Z85828 Personal history of other malignant neoplasm of skin: Secondary | ICD-10-CM | POA: Diagnosis not present

## 2015-10-10 DIAGNOSIS — H7201 Central perforation of tympanic membrane, right ear: Secondary | ICD-10-CM | POA: Diagnosis not present

## 2015-10-10 DIAGNOSIS — H6123 Impacted cerumen, bilateral: Secondary | ICD-10-CM | POA: Diagnosis not present

## 2015-10-10 DIAGNOSIS — H6983 Other specified disorders of Eustachian tube, bilateral: Secondary | ICD-10-CM | POA: Diagnosis not present

## 2015-10-10 DIAGNOSIS — H903 Sensorineural hearing loss, bilateral: Secondary | ICD-10-CM | POA: Diagnosis not present

## 2015-11-14 DIAGNOSIS — M6281 Muscle weakness (generalized): Secondary | ICD-10-CM | POA: Diagnosis not present

## 2015-11-14 DIAGNOSIS — R260 Ataxic gait: Secondary | ICD-10-CM | POA: Diagnosis not present

## 2015-11-14 DIAGNOSIS — R278 Other lack of coordination: Secondary | ICD-10-CM | POA: Diagnosis not present

## 2015-11-15 DIAGNOSIS — R260 Ataxic gait: Secondary | ICD-10-CM | POA: Diagnosis not present

## 2015-11-15 DIAGNOSIS — M6281 Muscle weakness (generalized): Secondary | ICD-10-CM | POA: Diagnosis not present

## 2015-11-15 DIAGNOSIS — R278 Other lack of coordination: Secondary | ICD-10-CM | POA: Diagnosis not present

## 2015-11-17 DIAGNOSIS — M6281 Muscle weakness (generalized): Secondary | ICD-10-CM | POA: Diagnosis not present

## 2015-11-17 DIAGNOSIS — R278 Other lack of coordination: Secondary | ICD-10-CM | POA: Diagnosis not present

## 2015-11-17 DIAGNOSIS — R260 Ataxic gait: Secondary | ICD-10-CM | POA: Diagnosis not present

## 2015-11-20 DIAGNOSIS — R278 Other lack of coordination: Secondary | ICD-10-CM | POA: Diagnosis not present

## 2015-11-20 DIAGNOSIS — R260 Ataxic gait: Secondary | ICD-10-CM | POA: Diagnosis not present

## 2015-11-20 DIAGNOSIS — M6281 Muscle weakness (generalized): Secondary | ICD-10-CM | POA: Diagnosis not present

## 2015-11-21 DIAGNOSIS — R260 Ataxic gait: Secondary | ICD-10-CM | POA: Diagnosis not present

## 2015-11-21 DIAGNOSIS — R278 Other lack of coordination: Secondary | ICD-10-CM | POA: Diagnosis not present

## 2015-11-21 DIAGNOSIS — M6281 Muscle weakness (generalized): Secondary | ICD-10-CM | POA: Diagnosis not present

## 2015-11-23 DIAGNOSIS — R278 Other lack of coordination: Secondary | ICD-10-CM | POA: Diagnosis not present

## 2015-11-23 DIAGNOSIS — R260 Ataxic gait: Secondary | ICD-10-CM | POA: Diagnosis not present

## 2015-11-23 DIAGNOSIS — M6281 Muscle weakness (generalized): Secondary | ICD-10-CM | POA: Diagnosis not present

## 2015-11-28 DIAGNOSIS — M6281 Muscle weakness (generalized): Secondary | ICD-10-CM | POA: Diagnosis not present

## 2015-11-28 DIAGNOSIS — R278 Other lack of coordination: Secondary | ICD-10-CM | POA: Diagnosis not present

## 2015-11-28 DIAGNOSIS — R260 Ataxic gait: Secondary | ICD-10-CM | POA: Diagnosis not present

## 2015-11-30 DIAGNOSIS — R260 Ataxic gait: Secondary | ICD-10-CM | POA: Diagnosis not present

## 2015-11-30 DIAGNOSIS — M6281 Muscle weakness (generalized): Secondary | ICD-10-CM | POA: Diagnosis not present

## 2015-11-30 DIAGNOSIS — R278 Other lack of coordination: Secondary | ICD-10-CM | POA: Diagnosis not present

## 2015-12-01 DIAGNOSIS — R260 Ataxic gait: Secondary | ICD-10-CM | POA: Diagnosis not present

## 2015-12-01 DIAGNOSIS — R278 Other lack of coordination: Secondary | ICD-10-CM | POA: Diagnosis not present

## 2015-12-01 DIAGNOSIS — M6281 Muscle weakness (generalized): Secondary | ICD-10-CM | POA: Diagnosis not present

## 2015-12-04 DIAGNOSIS — R260 Ataxic gait: Secondary | ICD-10-CM | POA: Diagnosis not present

## 2015-12-04 DIAGNOSIS — M79672 Pain in left foot: Secondary | ICD-10-CM | POA: Diagnosis not present

## 2015-12-04 DIAGNOSIS — M25572 Pain in left ankle and joints of left foot: Secondary | ICD-10-CM | POA: Diagnosis not present

## 2015-12-04 DIAGNOSIS — R278 Other lack of coordination: Secondary | ICD-10-CM | POA: Diagnosis not present

## 2015-12-04 DIAGNOSIS — M6281 Muscle weakness (generalized): Secondary | ICD-10-CM | POA: Diagnosis not present

## 2015-12-05 DIAGNOSIS — M6281 Muscle weakness (generalized): Secondary | ICD-10-CM | POA: Diagnosis not present

## 2015-12-05 DIAGNOSIS — R278 Other lack of coordination: Secondary | ICD-10-CM | POA: Diagnosis not present

## 2015-12-05 DIAGNOSIS — R260 Ataxic gait: Secondary | ICD-10-CM | POA: Diagnosis not present

## 2015-12-07 DIAGNOSIS — R278 Other lack of coordination: Secondary | ICD-10-CM | POA: Diagnosis not present

## 2015-12-07 DIAGNOSIS — M6281 Muscle weakness (generalized): Secondary | ICD-10-CM | POA: Diagnosis not present

## 2015-12-07 DIAGNOSIS — R260 Ataxic gait: Secondary | ICD-10-CM | POA: Diagnosis not present

## 2015-12-08 DIAGNOSIS — R260 Ataxic gait: Secondary | ICD-10-CM | POA: Diagnosis not present

## 2015-12-08 DIAGNOSIS — R278 Other lack of coordination: Secondary | ICD-10-CM | POA: Diagnosis not present

## 2015-12-08 DIAGNOSIS — M6281 Muscle weakness (generalized): Secondary | ICD-10-CM | POA: Diagnosis not present

## 2015-12-11 DIAGNOSIS — R278 Other lack of coordination: Secondary | ICD-10-CM | POA: Diagnosis not present

## 2015-12-11 DIAGNOSIS — R260 Ataxic gait: Secondary | ICD-10-CM | POA: Diagnosis not present

## 2015-12-11 DIAGNOSIS — M6281 Muscle weakness (generalized): Secondary | ICD-10-CM | POA: Diagnosis not present

## 2015-12-12 DIAGNOSIS — M6281 Muscle weakness (generalized): Secondary | ICD-10-CM | POA: Diagnosis not present

## 2015-12-12 DIAGNOSIS — R278 Other lack of coordination: Secondary | ICD-10-CM | POA: Diagnosis not present

## 2015-12-12 DIAGNOSIS — R260 Ataxic gait: Secondary | ICD-10-CM | POA: Diagnosis not present

## 2015-12-14 DIAGNOSIS — R278 Other lack of coordination: Secondary | ICD-10-CM | POA: Diagnosis not present

## 2015-12-14 DIAGNOSIS — M79672 Pain in left foot: Secondary | ICD-10-CM | POA: Diagnosis not present

## 2015-12-14 DIAGNOSIS — R260 Ataxic gait: Secondary | ICD-10-CM | POA: Diagnosis not present

## 2015-12-14 DIAGNOSIS — M6281 Muscle weakness (generalized): Secondary | ICD-10-CM | POA: Diagnosis not present

## 2015-12-14 DIAGNOSIS — M25572 Pain in left ankle and joints of left foot: Secondary | ICD-10-CM | POA: Diagnosis not present

## 2015-12-18 DIAGNOSIS — R278 Other lack of coordination: Secondary | ICD-10-CM | POA: Diagnosis not present

## 2015-12-18 DIAGNOSIS — R260 Ataxic gait: Secondary | ICD-10-CM | POA: Diagnosis not present

## 2015-12-18 DIAGNOSIS — M6281 Muscle weakness (generalized): Secondary | ICD-10-CM | POA: Diagnosis not present

## 2015-12-19 DIAGNOSIS — M6281 Muscle weakness (generalized): Secondary | ICD-10-CM | POA: Diagnosis not present

## 2015-12-19 DIAGNOSIS — R260 Ataxic gait: Secondary | ICD-10-CM | POA: Diagnosis not present

## 2015-12-19 DIAGNOSIS — R278 Other lack of coordination: Secondary | ICD-10-CM | POA: Diagnosis not present

## 2015-12-21 DIAGNOSIS — R260 Ataxic gait: Secondary | ICD-10-CM | POA: Diagnosis not present

## 2015-12-21 DIAGNOSIS — M6281 Muscle weakness (generalized): Secondary | ICD-10-CM | POA: Diagnosis not present

## 2015-12-21 DIAGNOSIS — R278 Other lack of coordination: Secondary | ICD-10-CM | POA: Diagnosis not present

## 2015-12-25 DIAGNOSIS — R278 Other lack of coordination: Secondary | ICD-10-CM | POA: Diagnosis not present

## 2015-12-25 DIAGNOSIS — M6281 Muscle weakness (generalized): Secondary | ICD-10-CM | POA: Diagnosis not present

## 2015-12-25 DIAGNOSIS — R260 Ataxic gait: Secondary | ICD-10-CM | POA: Diagnosis not present

## 2015-12-26 DIAGNOSIS — R260 Ataxic gait: Secondary | ICD-10-CM | POA: Diagnosis not present

## 2015-12-26 DIAGNOSIS — R278 Other lack of coordination: Secondary | ICD-10-CM | POA: Diagnosis not present

## 2015-12-26 DIAGNOSIS — M6281 Muscle weakness (generalized): Secondary | ICD-10-CM | POA: Diagnosis not present

## 2015-12-28 DIAGNOSIS — R278 Other lack of coordination: Secondary | ICD-10-CM | POA: Diagnosis not present

## 2015-12-28 DIAGNOSIS — R260 Ataxic gait: Secondary | ICD-10-CM | POA: Diagnosis not present

## 2015-12-28 DIAGNOSIS — M6281 Muscle weakness (generalized): Secondary | ICD-10-CM | POA: Diagnosis not present

## 2016-01-01 DIAGNOSIS — R278 Other lack of coordination: Secondary | ICD-10-CM | POA: Diagnosis not present

## 2016-01-01 DIAGNOSIS — M6281 Muscle weakness (generalized): Secondary | ICD-10-CM | POA: Diagnosis not present

## 2016-01-01 DIAGNOSIS — R260 Ataxic gait: Secondary | ICD-10-CM | POA: Diagnosis not present

## 2016-01-02 DIAGNOSIS — R278 Other lack of coordination: Secondary | ICD-10-CM | POA: Diagnosis not present

## 2016-01-02 DIAGNOSIS — R260 Ataxic gait: Secondary | ICD-10-CM | POA: Diagnosis not present

## 2016-01-02 DIAGNOSIS — M6281 Muscle weakness (generalized): Secondary | ICD-10-CM | POA: Diagnosis not present

## 2016-01-04 DIAGNOSIS — H26493 Other secondary cataract, bilateral: Secondary | ICD-10-CM | POA: Diagnosis not present

## 2016-01-04 DIAGNOSIS — M6281 Muscle weakness (generalized): Secondary | ICD-10-CM | POA: Diagnosis not present

## 2016-01-04 DIAGNOSIS — R260 Ataxic gait: Secondary | ICD-10-CM | POA: Diagnosis not present

## 2016-01-04 DIAGNOSIS — H524 Presbyopia: Secondary | ICD-10-CM | POA: Diagnosis not present

## 2016-01-04 DIAGNOSIS — R278 Other lack of coordination: Secondary | ICD-10-CM | POA: Diagnosis not present

## 2016-01-08 DIAGNOSIS — R278 Other lack of coordination: Secondary | ICD-10-CM | POA: Diagnosis not present

## 2016-01-08 DIAGNOSIS — M6281 Muscle weakness (generalized): Secondary | ICD-10-CM | POA: Diagnosis not present

## 2016-01-08 DIAGNOSIS — R260 Ataxic gait: Secondary | ICD-10-CM | POA: Diagnosis not present

## 2016-01-09 DIAGNOSIS — M6281 Muscle weakness (generalized): Secondary | ICD-10-CM | POA: Diagnosis not present

## 2016-01-09 DIAGNOSIS — R260 Ataxic gait: Secondary | ICD-10-CM | POA: Diagnosis not present

## 2016-01-09 DIAGNOSIS — R278 Other lack of coordination: Secondary | ICD-10-CM | POA: Diagnosis not present

## 2016-01-11 DIAGNOSIS — R278 Other lack of coordination: Secondary | ICD-10-CM | POA: Diagnosis not present

## 2016-01-11 DIAGNOSIS — M6281 Muscle weakness (generalized): Secondary | ICD-10-CM | POA: Diagnosis not present

## 2016-01-11 DIAGNOSIS — R260 Ataxic gait: Secondary | ICD-10-CM | POA: Diagnosis not present

## 2016-01-15 DIAGNOSIS — R278 Other lack of coordination: Secondary | ICD-10-CM | POA: Diagnosis not present

## 2016-01-15 DIAGNOSIS — M6281 Muscle weakness (generalized): Secondary | ICD-10-CM | POA: Diagnosis not present

## 2016-01-15 DIAGNOSIS — R260 Ataxic gait: Secondary | ICD-10-CM | POA: Diagnosis not present

## 2016-01-17 DIAGNOSIS — R260 Ataxic gait: Secondary | ICD-10-CM | POA: Diagnosis not present

## 2016-01-17 DIAGNOSIS — R278 Other lack of coordination: Secondary | ICD-10-CM | POA: Diagnosis not present

## 2016-01-17 DIAGNOSIS — M6281 Muscle weakness (generalized): Secondary | ICD-10-CM | POA: Diagnosis not present

## 2016-01-18 DIAGNOSIS — R278 Other lack of coordination: Secondary | ICD-10-CM | POA: Diagnosis not present

## 2016-01-18 DIAGNOSIS — M6281 Muscle weakness (generalized): Secondary | ICD-10-CM | POA: Diagnosis not present

## 2016-01-18 DIAGNOSIS — R260 Ataxic gait: Secondary | ICD-10-CM | POA: Diagnosis not present

## 2016-01-22 DIAGNOSIS — R278 Other lack of coordination: Secondary | ICD-10-CM | POA: Diagnosis not present

## 2016-01-22 DIAGNOSIS — M6281 Muscle weakness (generalized): Secondary | ICD-10-CM | POA: Diagnosis not present

## 2016-01-22 DIAGNOSIS — R260 Ataxic gait: Secondary | ICD-10-CM | POA: Diagnosis not present

## 2016-01-24 DIAGNOSIS — R260 Ataxic gait: Secondary | ICD-10-CM | POA: Diagnosis not present

## 2016-01-24 DIAGNOSIS — R278 Other lack of coordination: Secondary | ICD-10-CM | POA: Diagnosis not present

## 2016-01-24 DIAGNOSIS — M6281 Muscle weakness (generalized): Secondary | ICD-10-CM | POA: Diagnosis not present

## 2016-01-29 DIAGNOSIS — R278 Other lack of coordination: Secondary | ICD-10-CM | POA: Diagnosis not present

## 2016-01-29 DIAGNOSIS — R260 Ataxic gait: Secondary | ICD-10-CM | POA: Diagnosis not present

## 2016-01-29 DIAGNOSIS — M6281 Muscle weakness (generalized): Secondary | ICD-10-CM | POA: Diagnosis not present

## 2016-01-31 DIAGNOSIS — R260 Ataxic gait: Secondary | ICD-10-CM | POA: Diagnosis not present

## 2016-01-31 DIAGNOSIS — M6281 Muscle weakness (generalized): Secondary | ICD-10-CM | POA: Diagnosis not present

## 2016-01-31 DIAGNOSIS — R278 Other lack of coordination: Secondary | ICD-10-CM | POA: Diagnosis not present

## 2016-02-05 DIAGNOSIS — M6281 Muscle weakness (generalized): Secondary | ICD-10-CM | POA: Diagnosis not present

## 2016-02-05 DIAGNOSIS — R260 Ataxic gait: Secondary | ICD-10-CM | POA: Diagnosis not present

## 2016-02-05 DIAGNOSIS — R278 Other lack of coordination: Secondary | ICD-10-CM | POA: Diagnosis not present

## 2016-02-07 DIAGNOSIS — R260 Ataxic gait: Secondary | ICD-10-CM | POA: Diagnosis not present

## 2016-02-07 DIAGNOSIS — M6281 Muscle weakness (generalized): Secondary | ICD-10-CM | POA: Diagnosis not present

## 2016-02-07 DIAGNOSIS — R278 Other lack of coordination: Secondary | ICD-10-CM | POA: Diagnosis not present

## 2016-02-12 DIAGNOSIS — R278 Other lack of coordination: Secondary | ICD-10-CM | POA: Diagnosis not present

## 2016-02-12 DIAGNOSIS — R260 Ataxic gait: Secondary | ICD-10-CM | POA: Diagnosis not present

## 2016-02-12 DIAGNOSIS — M6281 Muscle weakness (generalized): Secondary | ICD-10-CM | POA: Diagnosis not present

## 2016-02-14 DIAGNOSIS — M6281 Muscle weakness (generalized): Secondary | ICD-10-CM | POA: Diagnosis not present

## 2016-02-14 DIAGNOSIS — R260 Ataxic gait: Secondary | ICD-10-CM | POA: Diagnosis not present

## 2016-02-14 DIAGNOSIS — R278 Other lack of coordination: Secondary | ICD-10-CM | POA: Diagnosis not present

## 2016-02-19 DIAGNOSIS — R260 Ataxic gait: Secondary | ICD-10-CM | POA: Diagnosis not present

## 2016-02-19 DIAGNOSIS — M6281 Muscle weakness (generalized): Secondary | ICD-10-CM | POA: Diagnosis not present

## 2016-02-19 DIAGNOSIS — R278 Other lack of coordination: Secondary | ICD-10-CM | POA: Diagnosis not present

## 2016-02-21 DIAGNOSIS — R260 Ataxic gait: Secondary | ICD-10-CM | POA: Diagnosis not present

## 2016-02-21 DIAGNOSIS — R278 Other lack of coordination: Secondary | ICD-10-CM | POA: Diagnosis not present

## 2016-02-21 DIAGNOSIS — M6281 Muscle weakness (generalized): Secondary | ICD-10-CM | POA: Diagnosis not present

## 2016-02-26 DIAGNOSIS — R260 Ataxic gait: Secondary | ICD-10-CM | POA: Diagnosis not present

## 2016-02-26 DIAGNOSIS — M6281 Muscle weakness (generalized): Secondary | ICD-10-CM | POA: Diagnosis not present

## 2016-02-26 DIAGNOSIS — R278 Other lack of coordination: Secondary | ICD-10-CM | POA: Diagnosis not present

## 2016-02-28 DIAGNOSIS — R278 Other lack of coordination: Secondary | ICD-10-CM | POA: Diagnosis not present

## 2016-02-28 DIAGNOSIS — M6281 Muscle weakness (generalized): Secondary | ICD-10-CM | POA: Diagnosis not present

## 2016-02-28 DIAGNOSIS — R260 Ataxic gait: Secondary | ICD-10-CM | POA: Diagnosis not present

## 2016-03-06 DIAGNOSIS — R278 Other lack of coordination: Secondary | ICD-10-CM | POA: Diagnosis not present

## 2016-03-06 DIAGNOSIS — R260 Ataxic gait: Secondary | ICD-10-CM | POA: Diagnosis not present

## 2016-03-06 DIAGNOSIS — M6281 Muscle weakness (generalized): Secondary | ICD-10-CM | POA: Diagnosis not present

## 2016-03-08 DIAGNOSIS — M6281 Muscle weakness (generalized): Secondary | ICD-10-CM | POA: Diagnosis not present

## 2016-03-08 DIAGNOSIS — R260 Ataxic gait: Secondary | ICD-10-CM | POA: Diagnosis not present

## 2016-03-08 DIAGNOSIS — R278 Other lack of coordination: Secondary | ICD-10-CM | POA: Diagnosis not present

## 2016-03-21 DIAGNOSIS — C44311 Basal cell carcinoma of skin of nose: Secondary | ICD-10-CM | POA: Diagnosis not present

## 2016-03-21 DIAGNOSIS — D485 Neoplasm of uncertain behavior of skin: Secondary | ICD-10-CM | POA: Diagnosis not present

## 2016-05-30 DIAGNOSIS — I10 Essential (primary) hypertension: Secondary | ICD-10-CM | POA: Diagnosis not present

## 2016-05-30 DIAGNOSIS — N183 Chronic kidney disease, stage 3 (moderate): Secondary | ICD-10-CM | POA: Diagnosis not present

## 2016-05-30 DIAGNOSIS — M1990 Unspecified osteoarthritis, unspecified site: Secondary | ICD-10-CM | POA: Diagnosis not present

## 2016-05-30 DIAGNOSIS — Z23 Encounter for immunization: Secondary | ICD-10-CM | POA: Diagnosis not present

## 2016-05-30 DIAGNOSIS — K5900 Constipation, unspecified: Secondary | ICD-10-CM | POA: Diagnosis not present

## 2016-07-09 DIAGNOSIS — C44311 Basal cell carcinoma of skin of nose: Secondary | ICD-10-CM | POA: Diagnosis not present

## 2016-08-27 DIAGNOSIS — Z23 Encounter for immunization: Secondary | ICD-10-CM | POA: Diagnosis not present

## 2016-08-27 DIAGNOSIS — D485 Neoplasm of uncertain behavior of skin: Secondary | ICD-10-CM | POA: Diagnosis not present

## 2016-08-27 DIAGNOSIS — Z85828 Personal history of other malignant neoplasm of skin: Secondary | ICD-10-CM | POA: Diagnosis not present

## 2016-08-27 DIAGNOSIS — C44519 Basal cell carcinoma of skin of other part of trunk: Secondary | ICD-10-CM | POA: Diagnosis not present

## 2016-08-27 DIAGNOSIS — L820 Inflamed seborrheic keratosis: Secondary | ICD-10-CM | POA: Diagnosis not present

## 2017-02-27 DIAGNOSIS — K625 Hemorrhage of anus and rectum: Secondary | ICD-10-CM | POA: Diagnosis not present

## 2017-02-27 DIAGNOSIS — I129 Hypertensive chronic kidney disease with stage 1 through stage 4 chronic kidney disease, or unspecified chronic kidney disease: Secondary | ICD-10-CM | POA: Diagnosis not present

## 2017-04-10 DIAGNOSIS — R262 Difficulty in walking, not elsewhere classified: Secondary | ICD-10-CM | POA: Diagnosis not present

## 2017-04-10 DIAGNOSIS — R278 Other lack of coordination: Secondary | ICD-10-CM | POA: Diagnosis not present

## 2017-04-10 DIAGNOSIS — M6281 Muscle weakness (generalized): Secondary | ICD-10-CM | POA: Diagnosis not present

## 2017-04-10 DIAGNOSIS — R2689 Other abnormalities of gait and mobility: Secondary | ICD-10-CM | POA: Diagnosis not present

## 2017-04-11 DIAGNOSIS — R2689 Other abnormalities of gait and mobility: Secondary | ICD-10-CM | POA: Diagnosis not present

## 2017-04-11 DIAGNOSIS — R262 Difficulty in walking, not elsewhere classified: Secondary | ICD-10-CM | POA: Diagnosis not present

## 2017-04-11 DIAGNOSIS — M6281 Muscle weakness (generalized): Secondary | ICD-10-CM | POA: Diagnosis not present

## 2017-04-11 DIAGNOSIS — R278 Other lack of coordination: Secondary | ICD-10-CM | POA: Diagnosis not present

## 2017-04-14 DIAGNOSIS — R262 Difficulty in walking, not elsewhere classified: Secondary | ICD-10-CM | POA: Diagnosis not present

## 2017-04-14 DIAGNOSIS — M6281 Muscle weakness (generalized): Secondary | ICD-10-CM | POA: Diagnosis not present

## 2017-04-14 DIAGNOSIS — R278 Other lack of coordination: Secondary | ICD-10-CM | POA: Diagnosis not present

## 2017-04-14 DIAGNOSIS — R2689 Other abnormalities of gait and mobility: Secondary | ICD-10-CM | POA: Diagnosis not present

## 2017-04-15 DIAGNOSIS — M6281 Muscle weakness (generalized): Secondary | ICD-10-CM | POA: Diagnosis not present

## 2017-04-15 DIAGNOSIS — R2689 Other abnormalities of gait and mobility: Secondary | ICD-10-CM | POA: Diagnosis not present

## 2017-04-15 DIAGNOSIS — R262 Difficulty in walking, not elsewhere classified: Secondary | ICD-10-CM | POA: Diagnosis not present

## 2017-04-15 DIAGNOSIS — R278 Other lack of coordination: Secondary | ICD-10-CM | POA: Diagnosis not present

## 2017-04-17 DIAGNOSIS — R262 Difficulty in walking, not elsewhere classified: Secondary | ICD-10-CM | POA: Diagnosis not present

## 2017-04-17 DIAGNOSIS — M6281 Muscle weakness (generalized): Secondary | ICD-10-CM | POA: Diagnosis not present

## 2017-04-17 DIAGNOSIS — R2689 Other abnormalities of gait and mobility: Secondary | ICD-10-CM | POA: Diagnosis not present

## 2017-04-17 DIAGNOSIS — R278 Other lack of coordination: Secondary | ICD-10-CM | POA: Diagnosis not present

## 2017-04-21 DIAGNOSIS — R2689 Other abnormalities of gait and mobility: Secondary | ICD-10-CM | POA: Diagnosis not present

## 2017-04-21 DIAGNOSIS — R278 Other lack of coordination: Secondary | ICD-10-CM | POA: Diagnosis not present

## 2017-04-21 DIAGNOSIS — M6281 Muscle weakness (generalized): Secondary | ICD-10-CM | POA: Diagnosis not present

## 2017-04-21 DIAGNOSIS — R262 Difficulty in walking, not elsewhere classified: Secondary | ICD-10-CM | POA: Diagnosis not present

## 2017-04-22 DIAGNOSIS — M6281 Muscle weakness (generalized): Secondary | ICD-10-CM | POA: Diagnosis not present

## 2017-04-22 DIAGNOSIS — R278 Other lack of coordination: Secondary | ICD-10-CM | POA: Diagnosis not present

## 2017-04-22 DIAGNOSIS — R2689 Other abnormalities of gait and mobility: Secondary | ICD-10-CM | POA: Diagnosis not present

## 2017-04-22 DIAGNOSIS — R262 Difficulty in walking, not elsewhere classified: Secondary | ICD-10-CM | POA: Diagnosis not present

## 2017-04-24 DIAGNOSIS — M6281 Muscle weakness (generalized): Secondary | ICD-10-CM | POA: Diagnosis not present

## 2017-04-24 DIAGNOSIS — R262 Difficulty in walking, not elsewhere classified: Secondary | ICD-10-CM | POA: Diagnosis not present

## 2017-04-24 DIAGNOSIS — R2689 Other abnormalities of gait and mobility: Secondary | ICD-10-CM | POA: Diagnosis not present

## 2017-04-24 DIAGNOSIS — R278 Other lack of coordination: Secondary | ICD-10-CM | POA: Diagnosis not present

## 2017-04-29 DIAGNOSIS — R2689 Other abnormalities of gait and mobility: Secondary | ICD-10-CM | POA: Diagnosis not present

## 2017-04-29 DIAGNOSIS — R262 Difficulty in walking, not elsewhere classified: Secondary | ICD-10-CM | POA: Diagnosis not present

## 2017-04-29 DIAGNOSIS — R278 Other lack of coordination: Secondary | ICD-10-CM | POA: Diagnosis not present

## 2017-04-29 DIAGNOSIS — M6281 Muscle weakness (generalized): Secondary | ICD-10-CM | POA: Diagnosis not present

## 2017-05-01 DIAGNOSIS — R278 Other lack of coordination: Secondary | ICD-10-CM | POA: Diagnosis not present

## 2017-05-01 DIAGNOSIS — R262 Difficulty in walking, not elsewhere classified: Secondary | ICD-10-CM | POA: Diagnosis not present

## 2017-05-01 DIAGNOSIS — M6281 Muscle weakness (generalized): Secondary | ICD-10-CM | POA: Diagnosis not present

## 2017-05-01 DIAGNOSIS — R2689 Other abnormalities of gait and mobility: Secondary | ICD-10-CM | POA: Diagnosis not present

## 2017-05-03 DIAGNOSIS — R262 Difficulty in walking, not elsewhere classified: Secondary | ICD-10-CM | POA: Diagnosis not present

## 2017-05-03 DIAGNOSIS — M6281 Muscle weakness (generalized): Secondary | ICD-10-CM | POA: Diagnosis not present

## 2017-05-03 DIAGNOSIS — R278 Other lack of coordination: Secondary | ICD-10-CM | POA: Diagnosis not present

## 2017-05-03 DIAGNOSIS — R2689 Other abnormalities of gait and mobility: Secondary | ICD-10-CM | POA: Diagnosis not present

## 2017-05-05 DIAGNOSIS — R278 Other lack of coordination: Secondary | ICD-10-CM | POA: Diagnosis not present

## 2017-05-05 DIAGNOSIS — R262 Difficulty in walking, not elsewhere classified: Secondary | ICD-10-CM | POA: Diagnosis not present

## 2017-05-05 DIAGNOSIS — M6281 Muscle weakness (generalized): Secondary | ICD-10-CM | POA: Diagnosis not present

## 2017-05-05 DIAGNOSIS — R2689 Other abnormalities of gait and mobility: Secondary | ICD-10-CM | POA: Diagnosis not present

## 2017-05-06 DIAGNOSIS — R262 Difficulty in walking, not elsewhere classified: Secondary | ICD-10-CM | POA: Diagnosis not present

## 2017-05-06 DIAGNOSIS — R2689 Other abnormalities of gait and mobility: Secondary | ICD-10-CM | POA: Diagnosis not present

## 2017-05-06 DIAGNOSIS — M6281 Muscle weakness (generalized): Secondary | ICD-10-CM | POA: Diagnosis not present

## 2017-05-06 DIAGNOSIS — R278 Other lack of coordination: Secondary | ICD-10-CM | POA: Diagnosis not present

## 2017-05-07 DIAGNOSIS — K5900 Constipation, unspecified: Secondary | ICD-10-CM | POA: Diagnosis not present

## 2017-05-07 DIAGNOSIS — K921 Melena: Secondary | ICD-10-CM | POA: Diagnosis not present

## 2017-05-08 DIAGNOSIS — Z961 Presence of intraocular lens: Secondary | ICD-10-CM | POA: Diagnosis not present

## 2017-05-08 DIAGNOSIS — H26493 Other secondary cataract, bilateral: Secondary | ICD-10-CM | POA: Diagnosis not present

## 2017-05-08 DIAGNOSIS — Z85828 Personal history of other malignant neoplasm of skin: Secondary | ICD-10-CM | POA: Diagnosis not present

## 2017-05-08 DIAGNOSIS — H524 Presbyopia: Secondary | ICD-10-CM | POA: Diagnosis not present

## 2017-05-08 DIAGNOSIS — C44212 Basal cell carcinoma of skin of right ear and external auricular canal: Secondary | ICD-10-CM | POA: Diagnosis not present

## 2017-05-08 DIAGNOSIS — L570 Actinic keratosis: Secondary | ICD-10-CM | POA: Diagnosis not present

## 2017-05-08 DIAGNOSIS — D485 Neoplasm of uncertain behavior of skin: Secondary | ICD-10-CM | POA: Diagnosis not present

## 2017-05-08 DIAGNOSIS — L821 Other seborrheic keratosis: Secondary | ICD-10-CM | POA: Diagnosis not present

## 2017-05-09 DIAGNOSIS — R2689 Other abnormalities of gait and mobility: Secondary | ICD-10-CM | POA: Diagnosis not present

## 2017-05-09 DIAGNOSIS — M6281 Muscle weakness (generalized): Secondary | ICD-10-CM | POA: Diagnosis not present

## 2017-05-09 DIAGNOSIS — R262 Difficulty in walking, not elsewhere classified: Secondary | ICD-10-CM | POA: Diagnosis not present

## 2017-05-09 DIAGNOSIS — R278 Other lack of coordination: Secondary | ICD-10-CM | POA: Diagnosis not present

## 2017-05-13 DIAGNOSIS — M6281 Muscle weakness (generalized): Secondary | ICD-10-CM | POA: Diagnosis not present

## 2017-05-13 DIAGNOSIS — R262 Difficulty in walking, not elsewhere classified: Secondary | ICD-10-CM | POA: Diagnosis not present

## 2017-05-13 DIAGNOSIS — R278 Other lack of coordination: Secondary | ICD-10-CM | POA: Diagnosis not present

## 2017-05-13 DIAGNOSIS — R2689 Other abnormalities of gait and mobility: Secondary | ICD-10-CM | POA: Diagnosis not present

## 2017-05-14 DIAGNOSIS — M6281 Muscle weakness (generalized): Secondary | ICD-10-CM | POA: Diagnosis not present

## 2017-05-14 DIAGNOSIS — R278 Other lack of coordination: Secondary | ICD-10-CM | POA: Diagnosis not present

## 2017-05-14 DIAGNOSIS — R262 Difficulty in walking, not elsewhere classified: Secondary | ICD-10-CM | POA: Diagnosis not present

## 2017-05-14 DIAGNOSIS — R2689 Other abnormalities of gait and mobility: Secondary | ICD-10-CM | POA: Diagnosis not present

## 2017-05-15 DIAGNOSIS — R262 Difficulty in walking, not elsewhere classified: Secondary | ICD-10-CM | POA: Diagnosis not present

## 2017-05-15 DIAGNOSIS — M6281 Muscle weakness (generalized): Secondary | ICD-10-CM | POA: Diagnosis not present

## 2017-05-15 DIAGNOSIS — R278 Other lack of coordination: Secondary | ICD-10-CM | POA: Diagnosis not present

## 2017-05-15 DIAGNOSIS — R2689 Other abnormalities of gait and mobility: Secondary | ICD-10-CM | POA: Diagnosis not present

## 2017-05-21 DIAGNOSIS — R278 Other lack of coordination: Secondary | ICD-10-CM | POA: Diagnosis not present

## 2017-05-21 DIAGNOSIS — R2689 Other abnormalities of gait and mobility: Secondary | ICD-10-CM | POA: Diagnosis not present

## 2017-05-21 DIAGNOSIS — R262 Difficulty in walking, not elsewhere classified: Secondary | ICD-10-CM | POA: Diagnosis not present

## 2017-05-21 DIAGNOSIS — M6281 Muscle weakness (generalized): Secondary | ICD-10-CM | POA: Diagnosis not present

## 2017-05-23 DIAGNOSIS — M6281 Muscle weakness (generalized): Secondary | ICD-10-CM | POA: Diagnosis not present

## 2017-05-23 DIAGNOSIS — R262 Difficulty in walking, not elsewhere classified: Secondary | ICD-10-CM | POA: Diagnosis not present

## 2017-05-23 DIAGNOSIS — R2689 Other abnormalities of gait and mobility: Secondary | ICD-10-CM | POA: Diagnosis not present

## 2017-05-23 DIAGNOSIS — R278 Other lack of coordination: Secondary | ICD-10-CM | POA: Diagnosis not present

## 2017-05-26 DIAGNOSIS — I10 Essential (primary) hypertension: Secondary | ICD-10-CM | POA: Diagnosis not present

## 2017-05-26 DIAGNOSIS — M1990 Unspecified osteoarthritis, unspecified site: Secondary | ICD-10-CM | POA: Diagnosis not present

## 2017-05-26 DIAGNOSIS — K5900 Constipation, unspecified: Secondary | ICD-10-CM | POA: Diagnosis not present

## 2017-05-28 DIAGNOSIS — R2689 Other abnormalities of gait and mobility: Secondary | ICD-10-CM | POA: Diagnosis not present

## 2017-05-28 DIAGNOSIS — R262 Difficulty in walking, not elsewhere classified: Secondary | ICD-10-CM | POA: Diagnosis not present

## 2017-05-28 DIAGNOSIS — M6281 Muscle weakness (generalized): Secondary | ICD-10-CM | POA: Diagnosis not present

## 2017-05-28 DIAGNOSIS — R278 Other lack of coordination: Secondary | ICD-10-CM | POA: Diagnosis not present

## 2017-05-29 DIAGNOSIS — R2689 Other abnormalities of gait and mobility: Secondary | ICD-10-CM | POA: Diagnosis not present

## 2017-05-29 DIAGNOSIS — R278 Other lack of coordination: Secondary | ICD-10-CM | POA: Diagnosis not present

## 2017-05-29 DIAGNOSIS — M6281 Muscle weakness (generalized): Secondary | ICD-10-CM | POA: Diagnosis not present

## 2017-05-29 DIAGNOSIS — R262 Difficulty in walking, not elsewhere classified: Secondary | ICD-10-CM | POA: Diagnosis not present

## 2017-06-02 DIAGNOSIS — M6281 Muscle weakness (generalized): Secondary | ICD-10-CM | POA: Diagnosis not present

## 2017-06-02 DIAGNOSIS — R262 Difficulty in walking, not elsewhere classified: Secondary | ICD-10-CM | POA: Diagnosis not present

## 2017-06-02 DIAGNOSIS — R278 Other lack of coordination: Secondary | ICD-10-CM | POA: Diagnosis not present

## 2017-06-02 DIAGNOSIS — R2689 Other abnormalities of gait and mobility: Secondary | ICD-10-CM | POA: Diagnosis not present

## 2017-06-04 DIAGNOSIS — R262 Difficulty in walking, not elsewhere classified: Secondary | ICD-10-CM | POA: Diagnosis not present

## 2017-06-04 DIAGNOSIS — M6281 Muscle weakness (generalized): Secondary | ICD-10-CM | POA: Diagnosis not present

## 2017-06-04 DIAGNOSIS — R278 Other lack of coordination: Secondary | ICD-10-CM | POA: Diagnosis not present

## 2017-06-04 DIAGNOSIS — R2689 Other abnormalities of gait and mobility: Secondary | ICD-10-CM | POA: Diagnosis not present

## 2017-06-11 DIAGNOSIS — R278 Other lack of coordination: Secondary | ICD-10-CM | POA: Diagnosis not present

## 2017-06-11 DIAGNOSIS — R262 Difficulty in walking, not elsewhere classified: Secondary | ICD-10-CM | POA: Diagnosis not present

## 2017-06-11 DIAGNOSIS — R2689 Other abnormalities of gait and mobility: Secondary | ICD-10-CM | POA: Diagnosis not present

## 2017-06-11 DIAGNOSIS — M6281 Muscle weakness (generalized): Secondary | ICD-10-CM | POA: Diagnosis not present

## 2017-06-13 DIAGNOSIS — R262 Difficulty in walking, not elsewhere classified: Secondary | ICD-10-CM | POA: Diagnosis not present

## 2017-06-13 DIAGNOSIS — M6281 Muscle weakness (generalized): Secondary | ICD-10-CM | POA: Diagnosis not present

## 2017-06-13 DIAGNOSIS — R2689 Other abnormalities of gait and mobility: Secondary | ICD-10-CM | POA: Diagnosis not present

## 2017-06-13 DIAGNOSIS — R278 Other lack of coordination: Secondary | ICD-10-CM | POA: Diagnosis not present

## 2017-06-16 DIAGNOSIS — R278 Other lack of coordination: Secondary | ICD-10-CM | POA: Diagnosis not present

## 2017-06-16 DIAGNOSIS — R2689 Other abnormalities of gait and mobility: Secondary | ICD-10-CM | POA: Diagnosis not present

## 2017-06-16 DIAGNOSIS — R262 Difficulty in walking, not elsewhere classified: Secondary | ICD-10-CM | POA: Diagnosis not present

## 2017-06-16 DIAGNOSIS — M6281 Muscle weakness (generalized): Secondary | ICD-10-CM | POA: Diagnosis not present

## 2017-06-18 DIAGNOSIS — R262 Difficulty in walking, not elsewhere classified: Secondary | ICD-10-CM | POA: Diagnosis not present

## 2017-06-18 DIAGNOSIS — R278 Other lack of coordination: Secondary | ICD-10-CM | POA: Diagnosis not present

## 2017-06-18 DIAGNOSIS — M6281 Muscle weakness (generalized): Secondary | ICD-10-CM | POA: Diagnosis not present

## 2017-06-18 DIAGNOSIS — R2689 Other abnormalities of gait and mobility: Secondary | ICD-10-CM | POA: Diagnosis not present

## 2017-06-23 DIAGNOSIS — R2689 Other abnormalities of gait and mobility: Secondary | ICD-10-CM | POA: Diagnosis not present

## 2017-06-23 DIAGNOSIS — R278 Other lack of coordination: Secondary | ICD-10-CM | POA: Diagnosis not present

## 2017-06-23 DIAGNOSIS — M6281 Muscle weakness (generalized): Secondary | ICD-10-CM | POA: Diagnosis not present

## 2017-06-23 DIAGNOSIS — R262 Difficulty in walking, not elsewhere classified: Secondary | ICD-10-CM | POA: Diagnosis not present

## 2017-06-26 DIAGNOSIS — R2689 Other abnormalities of gait and mobility: Secondary | ICD-10-CM | POA: Diagnosis not present

## 2017-06-26 DIAGNOSIS — M6281 Muscle weakness (generalized): Secondary | ICD-10-CM | POA: Diagnosis not present

## 2017-06-26 DIAGNOSIS — R262 Difficulty in walking, not elsewhere classified: Secondary | ICD-10-CM | POA: Diagnosis not present

## 2017-06-26 DIAGNOSIS — R278 Other lack of coordination: Secondary | ICD-10-CM | POA: Diagnosis not present

## 2017-06-30 DIAGNOSIS — R2689 Other abnormalities of gait and mobility: Secondary | ICD-10-CM | POA: Diagnosis not present

## 2017-06-30 DIAGNOSIS — R262 Difficulty in walking, not elsewhere classified: Secondary | ICD-10-CM | POA: Diagnosis not present

## 2017-06-30 DIAGNOSIS — R278 Other lack of coordination: Secondary | ICD-10-CM | POA: Diagnosis not present

## 2017-06-30 DIAGNOSIS — M6281 Muscle weakness (generalized): Secondary | ICD-10-CM | POA: Diagnosis not present

## 2017-07-03 DIAGNOSIS — C44310 Basal cell carcinoma of skin of unspecified parts of face: Secondary | ICD-10-CM | POA: Diagnosis not present

## 2017-07-03 DIAGNOSIS — R278 Other lack of coordination: Secondary | ICD-10-CM | POA: Diagnosis not present

## 2017-07-03 DIAGNOSIS — Z23 Encounter for immunization: Secondary | ICD-10-CM | POA: Diagnosis not present

## 2017-07-03 DIAGNOSIS — M6281 Muscle weakness (generalized): Secondary | ICD-10-CM | POA: Diagnosis not present

## 2017-07-03 DIAGNOSIS — R2689 Other abnormalities of gait and mobility: Secondary | ICD-10-CM | POA: Diagnosis not present

## 2017-07-03 DIAGNOSIS — R262 Difficulty in walking, not elsewhere classified: Secondary | ICD-10-CM | POA: Diagnosis not present

## 2017-07-07 DIAGNOSIS — R262 Difficulty in walking, not elsewhere classified: Secondary | ICD-10-CM | POA: Diagnosis not present

## 2017-07-07 DIAGNOSIS — R278 Other lack of coordination: Secondary | ICD-10-CM | POA: Diagnosis not present

## 2017-07-07 DIAGNOSIS — N3946 Mixed incontinence: Secondary | ICD-10-CM | POA: Diagnosis not present

## 2017-07-07 DIAGNOSIS — R2689 Other abnormalities of gait and mobility: Secondary | ICD-10-CM | POA: Diagnosis not present

## 2017-07-07 DIAGNOSIS — M6281 Muscle weakness (generalized): Secondary | ICD-10-CM | POA: Diagnosis not present

## 2017-07-08 ENCOUNTER — Observation Stay (HOSPITAL_COMMUNITY): Payer: Medicare Other

## 2017-07-08 ENCOUNTER — Encounter (HOSPITAL_COMMUNITY): Payer: Self-pay

## 2017-07-08 ENCOUNTER — Observation Stay (HOSPITAL_COMMUNITY)
Admission: EM | Admit: 2017-07-08 | Discharge: 2017-07-09 | Disposition: A | Discharge status: Skilled Nursing Facility | Payer: Medicare Other | Attending: Family Medicine | Admitting: Family Medicine

## 2017-07-08 ENCOUNTER — Other Ambulatory Visit: Payer: Self-pay | Admitting: Family Medicine

## 2017-07-08 ENCOUNTER — Ambulatory Visit
Admission: RE | Admit: 2017-07-08 | Discharge: 2017-07-08 | Disposition: A | Discharge status: Home / Self Care | Payer: Medicare Other | Source: Ambulatory Visit | Attending: Family Medicine | Admitting: Family Medicine

## 2017-07-08 DIAGNOSIS — Z79899 Other long term (current) drug therapy: Secondary | ICD-10-CM | POA: Diagnosis not present

## 2017-07-08 DIAGNOSIS — Z9181 History of falling: Secondary | ICD-10-CM | POA: Diagnosis not present

## 2017-07-08 DIAGNOSIS — G9341 Metabolic encephalopathy: Secondary | ICD-10-CM | POA: Diagnosis not present

## 2017-07-08 DIAGNOSIS — M810 Age-related osteoporosis without current pathological fracture: Secondary | ICD-10-CM | POA: Diagnosis not present

## 2017-07-08 DIAGNOSIS — E876 Hypokalemia: Secondary | ICD-10-CM

## 2017-07-08 DIAGNOSIS — W19XXXA Unspecified fall, initial encounter: Secondary | ICD-10-CM

## 2017-07-08 DIAGNOSIS — Z7982 Long term (current) use of aspirin: Secondary | ICD-10-CM | POA: Insufficient documentation

## 2017-07-08 DIAGNOSIS — I10 Essential (primary) hypertension: Secondary | ICD-10-CM | POA: Diagnosis not present

## 2017-07-08 DIAGNOSIS — S065X9A Traumatic subdural hemorrhage with loss of consciousness of unspecified duration, initial encounter: Secondary | ICD-10-CM | POA: Diagnosis not present

## 2017-07-08 DIAGNOSIS — Z881 Allergy status to other antibiotic agents status: Secondary | ICD-10-CM | POA: Diagnosis not present

## 2017-07-08 DIAGNOSIS — I6200 Nontraumatic subdural hemorrhage, unspecified: Secondary | ICD-10-CM | POA: Diagnosis not present

## 2017-07-08 DIAGNOSIS — R296 Repeated falls: Secondary | ICD-10-CM

## 2017-07-08 DIAGNOSIS — N390 Urinary tract infection, site not specified: Secondary | ICD-10-CM | POA: Diagnosis present

## 2017-07-08 DIAGNOSIS — M47812 Spondylosis without myelopathy or radiculopathy, cervical region: Secondary | ICD-10-CM | POA: Diagnosis not present

## 2017-07-08 DIAGNOSIS — S065XAA Traumatic subdural hemorrhage with loss of consciousness status unknown, initial encounter: Secondary | ICD-10-CM | POA: Diagnosis present

## 2017-07-08 DIAGNOSIS — S065X0A Traumatic subdural hemorrhage without loss of consciousness, initial encounter: Secondary | ICD-10-CM | POA: Diagnosis not present

## 2017-07-08 HISTORY — DX: Hypokalemia: E87.6

## 2017-07-08 LAB — CBC WITH DIFFERENTIAL/PLATELET
Basophils Absolute: 0 10*3/uL (ref 0.0–0.1)
Basophils Relative: 0 %
EOS ABS: 0 10*3/uL (ref 0.0–0.7)
EOS PCT: 0 %
HCT: 37.3 % (ref 36.0–46.0)
Hemoglobin: 12.4 g/dL (ref 12.0–15.0)
LYMPHS ABS: 2 10*3/uL (ref 0.7–4.0)
LYMPHS PCT: 17 %
MCH: 31.6 pg (ref 26.0–34.0)
MCHC: 33.2 g/dL (ref 30.0–36.0)
MCV: 95.2 fL (ref 78.0–100.0)
MONOS PCT: 10 %
Monocytes Absolute: 1.2 10*3/uL — ABNORMAL HIGH (ref 0.1–1.0)
Neutro Abs: 8.1 10*3/uL — ABNORMAL HIGH (ref 1.7–7.7)
Neutrophils Relative %: 73 %
PLATELETS: 338 10*3/uL (ref 150–400)
RBC: 3.92 MIL/uL (ref 3.87–5.11)
RDW: 13.5 % (ref 11.5–15.5)
WBC: 11.3 10*3/uL — AB (ref 4.0–10.5)

## 2017-07-08 LAB — BASIC METABOLIC PANEL
Anion gap: 11 (ref 5–15)
BUN: 10 mg/dL (ref 6–20)
CHLORIDE: 98 mmol/L — AB (ref 101–111)
CO2: 25 mmol/L (ref 22–32)
CREATININE: 0.81 mg/dL (ref 0.44–1.00)
Calcium: 9.2 mg/dL (ref 8.9–10.3)
GFR calc Af Amer: >60 mL/min (ref >60)
GFR calc non Af Amer: 60 mL/min — ABNORMAL LOW (ref >60)
GLUCOSE: 120 mg/dL — AB (ref 65–99)
POTASSIUM: 3.2 mmol/L — AB (ref 3.5–5.1)
SODIUM: 134 mmol/L — AB (ref 135–145)

## 2017-07-08 LAB — URINALYSIS, MICROSCOPIC (REFLEX)

## 2017-07-08 LAB — URINALYSIS, ROUTINE W REFLEX MICROSCOPIC
BILIRUBIN URINE: NEGATIVE
Glucose, UA: NEGATIVE mg/dL
Ketones, ur: NEGATIVE mg/dL
Nitrite: POSITIVE — AB
PROTEIN: NEGATIVE mg/dL
SPECIFIC GRAVITY, URINE: 1.01 (ref 1.005–1.030)
pH: 7 (ref 5.0–8.0)

## 2017-07-08 MED ORDER — POTASSIUM CHLORIDE 20 MEQ PO PACK
40.0000 meq | PACK | Freq: Once | ORAL | Status: AC
  Administered 2017-07-09: 40 meq via ORAL
  Filled 2017-07-08: qty 2

## 2017-07-08 MED ORDER — SODIUM CHLORIDE 0.9% FLUSH
3.0000 mL | Freq: Two times a day (BID) | INTRAVENOUS | Status: DC
  Administered 2017-07-09 (×2): 3 mL via INTRAVENOUS

## 2017-07-08 MED ORDER — SODIUM CHLORIDE 0.9 % IV SOLN: 250.0000 mL | INTRAVENOUS | Status: DC | PRN

## 2017-07-08 MED ORDER — ADULT MULTIVITAMIN W/MINERALS CH
1.0000 | ORAL_TABLET | Freq: Every day | ORAL | Status: DC
  Administered 2017-07-09: 1 via ORAL
  Filled 2017-07-08: qty 1

## 2017-07-08 MED ORDER — CEPHALEXIN 250 MG PO CAPS
250.0000 mg | ORAL_CAPSULE | Freq: Once | ORAL | Status: AC
  Administered 2017-07-08: 250 mg via ORAL
  Filled 2017-07-08: qty 1

## 2017-07-08 MED ORDER — ONDANSETRON HCL 4 MG/2ML IJ SOLN
4.0000 mg | Freq: Once | INTRAMUSCULAR | Status: AC
  Administered 2017-07-08: 4 mg via INTRAVENOUS
  Filled 2017-07-08: qty 2

## 2017-07-08 MED ORDER — SODIUM CHLORIDE 0.9% FLUSH: 3.0000 mL | INTRAVENOUS | Status: DC | PRN

## 2017-07-08 MED ORDER — ONDANSETRON HCL 4 MG PO TABS: 4.0000 mg | ORAL_TABLET | Freq: Four times a day (QID) | ORAL | Status: DC | PRN

## 2017-07-08 MED ORDER — SODIUM CHLORIDE 0.9 % IV SOLN
INTRAVENOUS | Status: DC
  Administered 2017-07-08 (×2): via INTRAVENOUS

## 2017-07-08 MED ORDER — AMLODIPINE BESYLATE 2.5 MG PO TABS
2.5000 mg | ORAL_TABLET | Freq: Every day | ORAL | Status: DC
  Administered 2017-07-09: 2.5 mg via ORAL
  Filled 2017-07-08: qty 1

## 2017-07-08 MED ORDER — LEVETIRACETAM 500 MG PO TABS
500.0000 mg | ORAL_TABLET | Freq: Two times a day (BID) | ORAL | Status: DC
  Administered 2017-07-09: 500 mg via ORAL
  Filled 2017-07-08: qty 1

## 2017-07-08 MED ORDER — LEVETIRACETAM 500 MG PO TABS
500.0000 mg | ORAL_TABLET | Freq: Once | ORAL | Status: AC
  Administered 2017-07-08: 500 mg via ORAL
  Filled 2017-07-08: qty 1

## 2017-07-08 MED ORDER — CEPHALEXIN 250 MG PO CAPS
250.0000 mg | ORAL_CAPSULE | Freq: Two times a day (BID) | ORAL | Status: DC
  Administered 2017-07-09 (×2): 250 mg via ORAL
  Filled 2017-07-08 (×2): qty 1

## 2017-07-08 MED ORDER — ONDANSETRON HCL 4 MG/2ML IJ SOLN: 4.0000 mg | Freq: Four times a day (QID) | INTRAMUSCULAR | Status: DC | PRN

## 2017-07-08 MED ORDER — CENTRUM PO CHEW: 1.0000 | CHEWABLE_TABLET | Freq: Every day | ORAL | Status: DC

## 2017-07-08 NOTE — ED Notes (Signed)
Patient transported to CT 

## 2017-07-08 NOTE — Consult Note (Signed)
Chief Complaint   Chief Complaint  Patient presents with  . Fall    Sunday     HPI   HPI: [name] is a 83 y.o. female who presented to ER after having an abnormal head CT. History given by daughter due to reported dementia. Patient fell Sunday hitting head, no LOC. Has been acting differently since (nausea, vomiting, more confused) so initiated head CT.  Patient Active Problem List   Diagnosis Date Noted  . Subdural hematoma ([place]) 07/08/2017    PMH: Past Medical History:  Diagnosis Date  . Cancer of the skin, basal cell 2008   FACE  . Degenerative joint disease (DJD) of hip   . Depression   . Gait disorder   . Hypertension   . Iron deficiency anemia   . Osteoporosis     PSH: Past Surgical History:  Procedure Laterality Date  . CATARACT EXTRACTION, BILATERAL    . HIP FRACTURE SURGERY  09/2004   DR. WHITFIELD  . NERVE ENTRAPMENT     LEFT THUMB  . SPINAL FUSION  1996   L3-4 [facility]  . WRIST FRACTURE SURGERY     DR. Daylene Katayama    Prescriptions Prior to Admission  Medication Sig Dispense Refill Last Dose  . acetaminophen (TYLENOL) 650 MG CR tablet Take 650 mg by mouth every 8 (eight) hours as needed for pain.   prn  . amLODipine (NORVASC) 2.5 MG tablet Take 2.5 mg by mouth daily.   07/08/2017 at Unknown time  . aspirin 81 MG tablet Take 81 mg by mouth daily.   07/08/2017 at Unknown time  . Calcium Carbonate-Vitamin D (CALTRATE 600+D) 600-400 MG-UNIT per tablet Take 1 tablet by mouth daily.   07/08/2017 at Unknown time  . multivitamin-iron-minerals-folic acid (CENTRUM) chewable tablet Chew 1 tablet by mouth daily.   07/08/2017 at Unknown time    SH: Social History  Substance Use Topics  . Smoking status: Never Smoker  . Smokeless tobacco: Never Used  . Alcohol use No    MEDS: Prior to Admission medications   Medication Sig Start Date End Date Taking? Authorizing Provider  acetaminophen (TYLENOL) 650 MG CR tablet Take 650 mg by mouth every 8 (eight)  hours as needed for pain.   Yes [provider]  amLODipine (NORVASC) 2.5 MG tablet Take 2.5 mg by mouth daily.   Yes [provider]  aspirin 81 MG tablet Take 81 mg by mouth daily.   Yes [provider]  Calcium Carbonate-Vitamin D (CALTRATE 600+D) 600-400 MG-UNIT per tablet Take 1 tablet by mouth daily.   Yes [provider]  multivitamin-iron-minerals-folic acid (CENTRUM) chewable tablet Chew 1 tablet by mouth daily.   Yes [provider]    ALLERGY: Allergies  Allergen Reactions  . Dicloxacillin     UNKNOWN  . Septra [Sulfamethoxazole-Trimethoprim]     N/V    Social History  Substance Use Topics  . Smoking status: Never Smoker  . Smokeless tobacco: Never Used  . Alcohol use No     Family History  Problem Relation Age of Onset  . Heart failure Mother   . Hypertension Father   . Diabetes Sister      ROS   ROS unable to obtain due to dementia  Exam   Vitals:   07/08/17 2015 07/08/17 2030  BP: (!) 155/72 134/79  Pulse: 89 89  Resp: (!) 24 15  SpO2: 93% 98%   General appearance: elderly female, resting comfortably, pleasant Eyes: PERRL  Cardiovascular: Regular rate and rhythm without murmurs, rubs, gallops. No edema or variciosities. Distal pulses normal. Pulmonary: Clear to auscultation Musculoskeletal:     Muscle tone upper extremities: Normal    Muscle tone lower extremities: Normal    Motor exam: Upper Extremities Deltoid Bicep Tricep Grip  Right 5/5 5/5 5/5 5/5  Left 5/5 5/5 5/5 5/5   Lower Extremity IP Quad PF DF EHL  Right 5/5 5/5 5/5 5/5 5/5  Left 5/5 5/5 5/5 5/5 5/5   Neurological Awake, alert CNII: Visual fields normal CNIII/IV/VI: EOMI CNV: Facial sensation normal CNVII: Symmetric, normal strength CNVIII: Grossly normal CNIX: Normal palate movement CNXI: Trap and SCM strength normal CN XII: Tongue protrusion normal Sensation grossly intact to LT  Results - Imaging/Labs   Results for orders  placed or performed during the hospital encounter of 07/08/17 (from the past 48 hour(s))  Basic metabolic panel     Status: Abnormal   Collection Time: 07/08/17  6:36 PM  Result Value Ref Range   Sodium 134 (L) 135 - 145 mmol/L   Potassium 3.2 (L) 3.5 - 5.1 mmol/L   Chloride 98 (L) 101 - 111 mmol/L   CO2 25 22 - 32 mmol/L   Glucose, Bld 120 (H) 65 - 99 mg/dL   BUN 10 6 - 20 mg/dL   Creatinine, Ser 0.81 0.44 - 1.00 mg/dL   Calcium 9.2 8.9 - 10.3 mg/dL   GFR calc non Af Amer 60 (L) >60 mL/min   GFR calc Af Amer >60 >60 mL/min    Comment: (NOTE) The eGFR has been calculated using the CKD EPI equation. This calculation has not been validated in all clinical situations. eGFR's persistently <60 mL/min signify possible Chronic Kidney Disease.    Anion gap 11 5 - 15  CBC with Differential     Status: Abnormal   Collection Time: 07/08/17  6:36 PM  Result Value Ref Range   WBC 11.3 (H) 4.0 - 10.5 K/uL   RBC 3.92 3.87 - 5.11 MIL/uL   Hemoglobin 12.4 12.0 - 15.0 g/dL   HCT 37.3 36.0 - 46.0 %   MCV 95.2 78.0 - 100.0 fL   MCH 31.6 26.0 - 34.0 pg   MCHC 33.2 30.0 - 36.0 g/dL   RDW 13.5 11.5 - 15.5 %   Platelets 338 150 - 400 K/uL   Neutrophils Relative % 73 %   Neutro Abs 8.1 (H) 1.7 - 7.7 K/uL   Lymphocytes Relative 17 %   Lymphs Abs 2.0 0.7 - 4.0 K/uL   Monocytes Relative 10 %   Monocytes Absolute 1.2 (H) 0.1 - 1.0 K/uL   Eosinophils Relative 0 %   Eosinophils Absolute 0.0 0.0 - 0.7 K/uL   Basophils Relative 0 %   Basophils Absolute 0.0 0.0 - 0.1 K/uL  Urinalysis, Routine w reflex microscopic     Status: Abnormal   Collection Time: 07/08/17  7:20 PM  Result Value Ref Range   Color, Urine YELLOW YELLOW   APPearance CLOUDY (A) CLEAR   Specific Gravity, Urine 1.010 1.005 - 1.030   pH 7.0 5.0 - 8.0   Glucose, UA NEGATIVE NEGATIVE mg/dL   Hgb urine dipstick SMALL (A) NEGATIVE   Bilirubin Urine NEGATIVE NEGATIVE   Ketones, ur NEGATIVE NEGATIVE mg/dL   Protein, ur NEGATIVE NEGATIVE  mg/dL   Nitrite POSITIVE (A) NEGATIVE   Leukocytes, UA LARGE (A) NEGATIVE  Urinalysis, Microscopic (reflex)     Status: Abnormal   Collection Time: 07/08/17  7:20 PM  Result Value Ref Range   RBC / HPF 0-5 0 - 5 RBC/hpf   WBC, UA 6-30 0 - 5 WBC/hpf   Bacteria, UA MANY (A) NONE SEEN   Squamous Epithelial / LPF 0-5 (A) NONE SEEN    Ct Head Wo Contrast  Addendum Date: 07/08/2017   ADDENDUM REPORT: 07/08/2017 19:21 ADDENDUM: Series 3, image 1 with subtle lucency at the junction of the basion and C1 ring. It is possible this is related to motion, degenerative changes and partial volume averaging however, cervical spine CT would be necessary to exclude fracture if of clinical concern. These results were called by telephone at the time of addendum on 07/08/2017 at 7:19 pm to Dr. Eulis Foster, who verbally acknowledged these results. Electronically Signed   By: Genia Del M.D.   On: 07/08/2017 19:21   Result Date: 07/08/2017 CLINICAL DATA:  83 year old hypertensive female post fall within the past few days. Unsteady and confused today. Initial encounter. EXAM: CT HEAD WITHOUT CONTRAST TECHNIQUE: Contiguous axial images were obtained from the base of the skull through the vertex without intravenous contrast. COMPARISON:  09/16/2010 FINDINGS: Brain: Broad-based right parafalcine subdural hematoma with maximal thickness frontal region of 5.9 mm. No significant mass effect upon adjacent brain parenchyma. Tiny left convexity extra-axial hyperdensity (series 601, image 27 and 602 image 35) possibly a small amount of subdural blood as versus tiny meningioma without mass effect. Prominent chronic microvascular changes without CT evidence of infarct. Global atrophy without hydrocephalus. Vascular: Vascular calcifications Skull: No skull fracture Sinuses/Orbits: No acute orbital abnormality. Visualized paranasal sinuses are clear. Other: Mastoid air cells and middle ear cavities are clear. Right temporomandibular joint  degenerative changes with minimal bony fragmentation without change. IMPRESSION: Broad-based right parafalcine subdural hematoma with maximal thickness frontal region of 5.9 mm. No significant mass effect upon adjacent brain parenchyma. Tiny left convexity extra-axial hyperdensity (series 601, image 27 and 602 image 35) possibly a small amount of subdural blood as versus tiny meningioma without mass effect. Prominent chronic microvascular changes without CT evidence of infarct. Global atrophy. These results were called by telephone at the time of interpretation on 07/08/2017 at 5:20 pm to Dr. Gaynelle Arabian , who verbally acknowledged these results. Electronically Signed: By: Genia Del M.D. On: 07/08/2017 18:08   Ct Cervical Spine Wo Contrast  Result Date: 07/08/2017 CLINICAL DATA:  History of fall with head bleed EXAM: CT CERVICAL SPINE WITHOUT CONTRAST TECHNIQUE: Multidetector CT imaging of the cervical spine was performed without intravenous contrast. Multiplanar CT image reconstructions were also generated. COMPARISON:  Head CT 07/08/2017, CT cervical spine 11/15/2008 FINDINGS: Alignment: 5 mm anterolisthesis of C7 on T1 unchanged. Trace anterolisthesis of C3 on C4, also unchanged. Facet alignment is within normal limits. Skull base and vertebrae: Craniovertebral junction is intact. Focal defect within the anterior arch of C1 slightly to the right of midline not seen on comparison CT. Vertebral body heights appear normal. Soft tissues and spinal canal: No prevertebral fluid or swelling. No visible canal hematoma. Prominent pannus at the tip of the dens. Disc levels: Advanced degenerative changes from C4 through T1 with disc space narrowing, endplate changes and osteophyte. Multilevel bilateral hypertrophic facet arthropathy with multiple level foraminal stenosis. Upper chest: Right apical scarring. Heterogeneous thyroid without dominant mass. Other: None IMPRESSION: 1. No change in alignment of the  cervical spine with anterolisthesis of C3 on C4 and C7 on T1. 2. Focal defect in the anterior arch of C1, new since comparison CT from 2010, suspect that this  represents progression of degenerative changes or inflammatory arthropathy at the C1-C2 articulation as opposed to a fracture. 3. Advanced degenerative changes. Electronically Signed   By: Donavan Foil M.D.   On: 07/08/2017 20:51    Impression/Plan   83 y.o. female with small SDH after a fall 2 days ago. She also is found to have a UTI here in the ED. SDH is small without significant mass effect or midline shift. I believe her UTI is more to blame for her nausea, vomiting and ?worsening confusion than this small SDH. No NS intervention for this small hematoma. With head injury >24hours hold, do not believe repeat scan is necessary unless clinically her exam changes. Per EDP, patient is going to be admitted for obs & UTI. Rec Keppra 528m BID x7days for seizure prophylaxis. No other recs.

## 2017-07-08 NOTE — ED Triage Notes (Signed)
Pt brought in by family from [facility]. Family reports that pt had a fall Sunday. Pt started vomiting today and had loss of bladder. Pt had CT scan prior to coming to hospital.

## 2017-07-08 NOTE — ED Provider Notes (Signed)
[facility] DEPT Provider Note   CSN: [id number] Arrival date & time: 07/08/17  1756     History   Chief Complaint Chief Complaint  Patient presents with  . Fall    Sunday     HPI [name] is a 83 y.o. female.  She presents for evaluation of sub-dural hematoma, found on imaging today as an outpatient.  She was therefore transferred here for evaluation.  Patient reportedly fell several days ago at her facility.  Patient's daughter was with her this morning and felt like she was decompensating, so initiated getting the head scanned.  Since arrival here the patient has  vomited, x1.  No known cause for fall.  The patient is unable to specify what happened.  She is a DNR.  There are no other no modifying factors.  Level 5 caveat-dementia  HPI  Past Medical History:  Diagnosis Date  . Cancer of the skin, basal cell 2008   FACE  . Degenerative joint disease (DJD) of hip   . Depression   . Gait disorder   . Hypertension   . Iron deficiency anemia   . Osteoporosis     Patient Active Problem List   Diagnosis Date Noted  . Subdural hematoma ([place]) 07/08/2017    Past Surgical History:  Procedure Laterality Date  . CATARACT EXTRACTION, BILATERAL    . HIP FRACTURE SURGERY  09/2004   DR. WHITFIELD  . NERVE ENTRAPMENT     LEFT THUMB  . SPINAL FUSION  1996   L3-4 [facility]  . WRIST FRACTURE SURGERY     DR. Daylene Katayama    OB History    No data available       Home Medications    Prior to Admission medications   Medication Sig Start Date End Date Taking? Authorizing Provider  acetaminophen (TYLENOL) 650 MG CR tablet Take 650 mg by mouth every 8 (eight) hours as needed for pain.   Yes [provider]  amLODipine (NORVASC) 2.5 MG tablet Take 2.5 mg by mouth daily.   Yes [provider]  aspirin 81 MG tablet Take 81 mg by mouth daily.   Yes [provider]  Calcium Carbonate-Vitamin D (CALTRATE 600+D) 600-400 MG-UNIT per tablet Take  1 tablet by mouth daily.   Yes [provider]  multivitamin-iron-minerals-folic acid (CENTRUM) chewable tablet Chew 1 tablet by mouth daily.   Yes [provider]    Family History Family History  Problem Relation Age of Onset  . Heart failure Mother   . Hypertension Father   . Diabetes Sister     Social History Social History  Substance Use Topics  . Smoking status: Never Smoker  . Smokeless tobacco: Never Used  . Alcohol use No     Allergies   Dicloxacillin and Septra [sulfamethoxazole-trimethoprim]   Review of Systems Review of Systems  All other systems reviewed and are negative.    Physical Exam Updated Vital Signs Ht 4\' 11"  (1.499 m)   Wt 49.9 kg (110 lb)   BMI 22.22 kg/m   Physical Exam  Constitutional: She appears well-developed.  Elderly, frail  HENT:  Head: Normocephalic and atraumatic.  Small amount of emesis on the left face.  Oral mucous membranes are moist.  No visible or palpable injuries to cranium or scalp.  Eyes: Pupils are equal, round, and reactive to light. Conjunctivae and EOM are normal. Right eye exhibits no discharge. Left eye exhibits no discharge.  Neck: Normal range of motion and  phonation normal. Neck supple.  Cardiovascular: Normal rate and regular rhythm.   Pulmonary/Chest: Effort normal and breath sounds normal. She exhibits no tenderness.  Abdominal: Soft. She exhibits no distension. There is no tenderness. There is no guarding.  Musculoskeletal: Normal range of motion.  Neurological: She is alert. She exhibits normal muscle tone.  Skin: Skin is warm and dry.  Psychiatric: She has a normal mood and affect. Her behavior is normal.  Nursing note and vitals reviewed.    ED Treatments / Results  Labs (all labs ordered are listed, but only abnormal results are displayed) Labs Reviewed  BASIC METABOLIC PANEL - Abnormal; Notable for the following:       Result Value   Sodium 134 (*)    Potassium 3.2 (*)     Chloride 98 (*)    Glucose, Bld 120 (*)    GFR calc non Af Amer 60 (*)    All other components within normal limits  CBC WITH DIFFERENTIAL/PLATELET - Abnormal; Notable for the following:    WBC 11.3 (*)    Neutro Abs 8.1 (*)    Monocytes Absolute 1.2 (*)    All other components within normal limits  URINALYSIS, ROUTINE W REFLEX MICROSCOPIC - Abnormal; Notable for the following:    APPearance CLOUDY (*)    Hgb urine dipstick SMALL (*)    Nitrite POSITIVE (*)    Leukocytes, UA LARGE (*)    All other components within normal limits  URINALYSIS, MICROSCOPIC (REFLEX) - Abnormal; Notable for the following:    Bacteria, UA MANY (*)    Squamous Epithelial / LPF 0-5 (*)    All other components within normal limits  URINE CULTURE    EKG  EKG Interpretation None       Radiology Ct Head Wo Contrast  Addendum Date: 07/08/2017   ADDENDUM REPORT: 07/08/2017 19:21 ADDENDUM: Series 3, image 1 with subtle lucency at the junction of the basion and C1 ring. It is possible this is related to motion, degenerative changes and partial volume averaging however, cervical spine CT would be necessary to exclude fracture if of clinical concern. These results were called by telephone at the time of addendum on 07/08/2017 at 7:19 pm to Dr. Eulis Foster, who verbally acknowledged these results. Electronically Signed   By: Genia Del M.D.   On: 07/08/2017 19:21   Result Date: 07/08/2017 CLINICAL DATA:  83 year old hypertensive female post fall within the past few days. Unsteady and confused today. Initial encounter. EXAM: CT HEAD WITHOUT CONTRAST TECHNIQUE: Contiguous axial images were obtained from the base of the skull through the vertex without intravenous contrast. COMPARISON:  09/16/2010 FINDINGS: Brain: Broad-based right parafalcine subdural hematoma with maximal thickness frontal region of 5.9 mm. No significant mass effect upon adjacent brain parenchyma. Tiny left convexity extra-axial hyperdensity (series 601,  image 27 and 602 image 35) possibly a small amount of subdural blood as versus tiny meningioma without mass effect. Prominent chronic microvascular changes without CT evidence of infarct. Global atrophy without hydrocephalus. Vascular: Vascular calcifications Skull: No skull fracture Sinuses/Orbits: No acute orbital abnormality. Visualized paranasal sinuses are clear. Other: Mastoid air cells and middle ear cavities are clear. Right temporomandibular joint degenerative changes with minimal bony fragmentation without change. IMPRESSION: Broad-based right parafalcine subdural hematoma with maximal thickness frontal region of 5.9 mm. No significant mass effect upon adjacent brain parenchyma. Tiny left convexity extra-axial hyperdensity (series 601, image 27 and 602 image 35) possibly a small amount of subdural blood as versus tiny meningioma  without mass effect. Prominent chronic microvascular changes without CT evidence of infarct. Global atrophy. These results were called by telephone at the time of interpretation on 07/08/2017 at 5:20 pm to Dr. Gaynelle Arabian , who verbally acknowledged these results. Electronically Signed: By: Genia Del M.D. On: 07/08/2017 18:08    Procedures Procedures (including critical care time)  Medications Ordered in ED Medications  0.9 %  sodium chloride infusion ( Intravenous New Bag/Given 07/08/17 1820)  levETIRAcetam (KEPPRA) tablet 500 mg (not administered)  cephALEXin (KEFLEX) capsule 250 mg (not administered)  ondansetron (ZOFRAN) injection 4 mg (4 mg Intravenous Given 07/08/17 1820)     Initial Impression / Assessment and Plan / ED Course  I have reviewed the triage vital signs and the nursing notes.  Pertinent labs & imaging results that were available during my care of the patient were reviewed by me and considered in my medical decision making (see chart for details).      Patient Vitals for the past 24 hrs:  Height Weight  07/08/17 1804 4\' 11"  (1.499 m)  49.9 kg (110 lb)    18: 20-call placed to neurosurgery, to discuss case.  Discussed the case with the physician assistant on-call for Dr. Kathyrn Sheriff.  He recommends starting Keppra 500 mg twice daily, and continue for 7 days only, as usual care for intracranial bleeding.  They will evaluate the patient tonight if needed, and plan to see the patient tomorrow.  He anticipates that the patient can be discharged, tomorrow.  7: 5 5 PM-Consult complete with hospitalist. Patient case explained and discussed.  She agrees to admit patient for further evaluation and treatment. Call ended at 51: 05   8:11 PM Reevaluation with update and discussion. After initial assessment and treatment, an updated evaluation reveals she remains alert, clinical exam unchanged.  Patient and family informed of findings and plan. Reznor Ferrando L     Final Clinical Impressions(s) / ED Diagnoses   Final diagnoses:  Subdural hematoma ([place])  Hypokalemia  Fall, initial encounter  Urinary tract infection without hematuria, site unspecified   Fall with subdural hematoma.  Incidental hypokalemia.  Likely nutritional.  Doubt ongoing loss, therefore will allow patient to eat and recommend repeat potassium level tomorrow.  Abnormal urine consistent with urinary tract infection.  Patient clinically stable, likely injury 3 days ago with fall.  Patient will be observed overnight, with neurosurgical evaluation, and likely repeat CT imaging.  Initiated treatment with Keppra, with recommendation to continue twice daily for 7 days.  Started Keflex, for UTI.  Nursing Notes Reviewed/ Care Coordinated Applicable Imaging Reviewed Interpretation of Laboratory Data incorporated into ED treatment  Plan: Admit  New Prescriptions New Prescriptions   No medications on file     Daleen Bo, MD 07/08/17 2015

## 2017-07-08 NOTE — Progress Notes (Signed)
Pt fell Sunday. Had outpt CT scan today showing SDH so advised to come to ER. SDH is small without significant mass effect or midline shift. No NS intervention for this small hematoma. With head injury >24hours hold, do not believe repeat scan is necessary unless clinically her exam changes. Per EDP, patient is going to be admitted for obs. Rec Keppra 500mg  BID x7days for seizure prophylaxis. No other recs.

## 2017-07-08 NOTE — H&P (Signed)
History and Physical    MARIABELLA NILSEN TFT:[id number] DOB: August 19, 1919 DOA: 07/08/2017  PCP: Gaynelle Arabian, MD  Patient coming from:  Assisted living  Chief Complaint:   ams , fall  HPI: [name] is a 83 y.o. female with medical history significant of HTN, frequent falls comes in with family with noted confusion and unsteady gait.  Pt fell at her assisted living Sunday (today Tuesday).  Today family went to check on her and noted she was not acting normal, was more confused than normal and more unsteady on her feet.  She normally walks with a walker.  They did not notice any facial abnormality or speech abnormality.  Her son is a Stage manager (at urgent care I think ) so arranged for a stat CT scan of her head which showed a small subdural hematoma.  She was then brought to the ED.  She vomited once in the car on the way to ED.  Family denies any fevers.  No urinary symptoms.  Her last uti was over 2 years ago and they report she gets really confused when she gets a uti.  Pt denies any issues, no pain.  No headache.  Pt referred for admission for monitoring for her subdural hematoma.   Review of Systems: As per HPI otherwise 10 point review of systems negative per family  Past Medical History:  Diagnosis Date  . Cancer of the skin, basal cell 2008   FACE  . Degenerative joint disease (DJD) of hip   . Depression   . Gait disorder   . Hypertension   . Iron deficiency anemia   . Osteoporosis     Past Surgical History:  Procedure Laterality Date  . CATARACT EXTRACTION, BILATERAL    . HIP FRACTURE SURGERY  09/2004   DR. WHITFIELD  . NERVE ENTRAPMENT     LEFT THUMB  . SPINAL FUSION  1996   L3-4 [facility]  . WRIST FRACTURE SURGERY     DR. Daylene Katayama     reports that she has never smoked. She has never used smokeless tobacco. She reports that she does not drink alcohol or use drugs.  Allergies  Allergen Reactions  . Dicloxacillin     UNKNOWN  . Septra  [Sulfamethoxazole-Trimethoprim]     N/V    Family History  Problem Relation Age of Onset  . Heart failure Mother   . Hypertension Father   . Diabetes Sister     Prior to Admission medications   Medication Sig Start Date End Date Taking? Authorizing Provider  acetaminophen (TYLENOL) 650 MG CR tablet Take 650 mg by mouth every 8 (eight) hours as needed for pain.   Yes [provider]  amLODipine (NORVASC) 2.5 MG tablet Take 2.5 mg by mouth daily.   Yes [provider]  aspirin 81 MG tablet Take 81 mg by mouth daily.   Yes [provider]  Calcium Carbonate-Vitamin D (CALTRATE 600+D) 600-400 MG-UNIT per tablet Take 1 tablet by mouth daily.   Yes [provider]  multivitamin-iron-minerals-folic acid (CENTRUM) chewable tablet Chew 1 tablet by mouth daily.   Yes [provider]    Physical Exam: Vitals:   07/08/17 1804 07/08/17 2015 07/08/17 2030  BP:  (!) 155/72 134/79  Pulse:  89 89  Resp:  (!) 24 15  SpO2:  93% 98%  Weight: 49.9 kg (110 lb)    Height: 4\' 11"  (1.499 m)        Constitutional: NAD, calm, comfortable  Vitals:   07/08/17 1804 07/08/17 2015 07/08/17 2030  BP:  (!) 155/72 134/79  Pulse:  89 89  Resp:  (!) 24 15  SpO2:  93% 98%  Weight: 49.9 kg (110 lb)    Height: 4\' 11"  (1.499 m)     Eyes: PERRL, lids and conjunctivae normal ENMT: Mucous membranes are moist. Posterior pharynx clear of any exudate or lesions.Normal dentition.  Neck: normal, supple, no masses, no thyromegaly Respiratory: clear to auscultation bilaterally, no wheezing, no crackles. Normal respiratory effort. No accessory muscle use.  Cardiovascular: Regular rate and rhythm, no murmurs / rubs / gallops. No extremity edema. 2+ pedal pulses. No carotid bruits.  Abdomen: no tenderness, no masses palpated. No hepatosplenomegaly. Bowel sounds positive.  Musculoskeletal: no clubbing / cyanosis. No joint deformity upper and lower extremities. Good ROM, no  contractures. Normal muscle tone.  Skin: no rashes, lesions, ulcers. No induration Neurologic: CN 2-12 grossly intact. Sensation intact, DTR normal. Strength 5/5 in all 4.  Psychiatric: Normal judgment and insight. Alert and oriented x 2. Normal mood.    Labs on Admission: I have personally reviewed following labs and imaging studies  CBC:  Recent Labs Lab 07/08/17 1836  WBC 11.3*  NEUTROABS 8.1*  HGB 12.4  HCT 37.3  MCV 95.2  PLT 782   Basic Metabolic Panel:  Recent Labs Lab 07/08/17 1836  NA 134*  K 3.2*  CL 98*  CO2 25  GLUCOSE 120*  BUN 10  CREATININE 0.81  CALCIUM 9.2   GFR: Estimated Creatinine Clearance: 28.3 mL/min (by C-G formula based on SCr of 0.81 mg/dL).  Radiological Exams on Admission: Ct Head Wo Contrast  Addendum Date: 07/08/2017   ADDENDUM REPORT: 07/08/2017 19:21 ADDENDUM: Series 3, image 1 with subtle lucency at the junction of the basion and C1 ring. It is possible this is related to motion, degenerative changes and partial volume averaging however, cervical spine CT would be necessary to exclude fracture if of clinical concern. These results were called by telephone at the time of addendum on 07/08/2017 at 7:19 pm to Dr. Eulis Foster, who verbally acknowledged these results. Electronically Signed   By: Genia Del M.D.   On: 07/08/2017 19:21   Result Date: 07/08/2017 CLINICAL DATA:  83 year old hypertensive female post fall within the past few days. Unsteady and confused today. Initial encounter. EXAM: CT HEAD WITHOUT CONTRAST TECHNIQUE: Contiguous axial images were obtained from the base of the skull through the vertex without intravenous contrast. COMPARISON:  09/16/2010 FINDINGS: Brain: Broad-based right parafalcine subdural hematoma with maximal thickness frontal region of 5.9 mm. No significant mass effect upon adjacent brain parenchyma. Tiny left convexity extra-axial hyperdensity (series 601, image 27 and 602 image 35) possibly a small amount of  subdural blood as versus tiny meningioma without mass effect. Prominent chronic microvascular changes without CT evidence of infarct. Global atrophy without hydrocephalus. Vascular: Vascular calcifications Skull: No skull fracture Sinuses/Orbits: No acute orbital abnormality. Visualized paranasal sinuses are clear. Other: Mastoid air cells and middle ear cavities are clear. Right temporomandibular joint degenerative changes with minimal bony fragmentation without change. IMPRESSION: Broad-based right parafalcine subdural hematoma with maximal thickness frontal region of 5.9 mm. No significant mass effect upon adjacent brain parenchyma. Tiny left convexity extra-axial hyperdensity (series 601, image 27 and 602 image 35) possibly a small amount of subdural blood as versus tiny meningioma without mass effect. Prominent chronic microvascular changes without CT evidence of infarct. Global atrophy. These results were called by telephone at the time of interpretation  on 07/08/2017 at 5:20 pm to Dr. Gaynelle Arabian , who verbally acknowledged these results. Electronically Signed: By: Genia Del M.D. On: 07/08/2017 18:08   Ct Cervical Spine Wo Contrast  Result Date: 07/08/2017 CLINICAL DATA:  History of fall with head bleed EXAM: CT CERVICAL SPINE WITHOUT CONTRAST TECHNIQUE: Multidetector CT imaging of the cervical spine was performed without intravenous contrast. Multiplanar CT image reconstructions were also generated. COMPARISON:  Head CT 07/08/2017, CT cervical spine 11/15/2008 FINDINGS: Alignment: 5 mm anterolisthesis of C7 on T1 unchanged. Trace anterolisthesis of C3 on C4, also unchanged. Facet alignment is within normal limits. Skull base and vertebrae: Craniovertebral junction is intact. Focal defect within the anterior arch of C1 slightly to the right of midline not seen on comparison CT. Vertebral body heights appear normal. Soft tissues and spinal canal: No prevertebral fluid or swelling. No visible canal  hematoma. Prominent pannus at the tip of the dens. Disc levels: Advanced degenerative changes from C4 through T1 with disc space narrowing, endplate changes and osteophyte. Multilevel bilateral hypertrophic facet arthropathy with multiple level foraminal stenosis. Upper chest: Right apical scarring. Heterogeneous thyroid without dominant mass. Other: None IMPRESSION: 1. No change in alignment of the cervical spine with anterolisthesis of C3 on C4 and C7 on T1. 2. Focal defect in the anterior arch of C1, new since comparison CT from 2010, suspect that this represents progression of degenerative changes or inflammatory arthropathy at the C1-C2 articulation as opposed to a fracture. 3. Advanced degenerative changes. Electronically Signed   By: Donavan Foil M.D.   On: 07/08/2017 20:51    Old chart reviewed Case discussed with dr Eulis Foster  Assessment/Plan 83 yo female with recent fall who has subdural hematoma and uti with some confusion  Principal Problem:   Subdural hematoma ([place])- very small.  Neurosurgery consulted.  Will not repeat ct unless she worsens.  Hold aspirin and all anticoagulants at this time.  Neuro cks q 4 hours.  Active Problems:   Frequent falls- uses walker   Acute lower UTI- place on keflex   Acute metabolic encephalopathy- this is likely more from her uti than from small bleed.  Seems to be back at baseline now   Spoken to her son and dtr in law.  Plan is to keep her overnight and d/c her in am if no worsening.  They are arranging for her to get closer sitter care at the assisted living.  Neurosurgery recommended placing her on keppra for the next week for seizure prophylaxis.   DVT prophylaxis:  scds Code Status:  DNR confirmed with son Family Communication:   Son and dtr in Sports coach Disposition Plan:  Per day team, plan to d/c in am Consults called:  neurosurgery Admission status:  observation   Melondy Blanchard A MD [facility]  If 7PM-7AM, please contact  night-coverage [email or web address] Password [email or web address]  07/08/2017, 10:16 PM

## 2017-07-09 ENCOUNTER — Encounter (HOSPITAL_COMMUNITY): Payer: Self-pay | Admitting: Family Medicine

## 2017-07-09 DIAGNOSIS — E876 Hypokalemia: Secondary | ICD-10-CM

## 2017-07-09 DIAGNOSIS — G9341 Metabolic encephalopathy: Secondary | ICD-10-CM | POA: Diagnosis not present

## 2017-07-09 DIAGNOSIS — S065X9A Traumatic subdural hemorrhage with loss of consciousness of unspecified duration, initial encounter: Secondary | ICD-10-CM | POA: Diagnosis not present

## 2017-07-09 DIAGNOSIS — N390 Urinary tract infection, site not specified: Secondary | ICD-10-CM | POA: Diagnosis not present

## 2017-07-09 LAB — BASIC METABOLIC PANEL
Anion gap: 8 (ref 5–15)
BUN: 10 mg/dL (ref 6–20)
CALCIUM: 8.5 mg/dL — AB (ref 8.9–10.3)
CHLORIDE: 104 mmol/L (ref 101–111)
CO2: 26 mmol/L (ref 22–32)
CREATININE: 0.83 mg/dL (ref 0.44–1.00)
GFR calc Af Amer: >60 mL/min (ref >60)
GFR calc non Af Amer: 58 mL/min — ABNORMAL LOW (ref >60)
Glucose, Bld: 107 mg/dL — ABNORMAL HIGH (ref 65–99)
Potassium: 3.4 mmol/L — ABNORMAL LOW (ref 3.5–5.1)
SODIUM: 138 mmol/L (ref 135–145)

## 2017-07-09 LAB — CBC
HCT: 34.1 % — ABNORMAL LOW (ref 36.0–46.0)
Hemoglobin: 11 g/dL — ABNORMAL LOW (ref 12.0–15.0)
MCH: 30.8 pg (ref 26.0–34.0)
MCHC: 32.3 g/dL (ref 30.0–36.0)
MCV: 95.5 fL (ref 78.0–100.0)
PLATELETS: 307 10*3/uL (ref 150–400)
RBC: 3.57 MIL/uL — ABNORMAL LOW (ref 3.87–5.11)
RDW: 13.4 % (ref 11.5–15.5)
WBC: 10.8 10*3/uL — AB (ref 4.0–10.5)

## 2017-07-09 LAB — MRSA PCR SCREENING: MRSA by PCR: NEGATIVE

## 2017-07-09 MED ORDER — CEPHALEXIN 250 MG PO CAPS: 250.0000 mg | ORAL_CAPSULE | Freq: Two times a day (BID) | ORAL | 0 refills | Status: AC

## 2017-07-09 MED ORDER — LEVETIRACETAM 500 MG PO TABS: 500.0000 mg | ORAL_TABLET | Freq: Two times a day (BID) | ORAL | 0 refills | Status: DC

## 2017-07-09 MED ORDER — POTASSIUM CHLORIDE CRYS ER 20 MEQ PO TBCR
40.0000 meq | EXTENDED_RELEASE_TABLET | Freq: Once | ORAL | Status: AC
  Administered 2017-07-09: 40 meq via ORAL
  Filled 2017-07-09: qty 2

## 2017-07-09 NOTE — Progress Notes (Signed)
CSW received consult regarding discharge. Patient's daughter and son-in-law wanted to confirm discharge plans. Patient resides at [facility] and will return there by car at discharge with extra help set up through [facility]. CSW also asked RN for a safety zone regarding patient's lost hearing aid. RNCM aware.   CSW signing off.  Percell Locus [name] LCSWA [phone]

## 2017-07-09 NOTE — Progress Notes (Signed)
Family requests all bedrails to be raised due to patients high fall risk.

## 2017-07-09 NOTE — Discharge Instructions (Addendum)
[name],  You were admitted because of a small brain bleed from your fall. The neurosurgeon group saw you and recommended symptomatic management (no surgery) and seizure prophylaxis medications (Keppra). You will take Keppra for one week. Since you had a small brain bleed, I recommend you hold your aspirin until you see your primary care physician. While you were here, we also noticed that you have evidence of a urinary tract infection and you have been started on Keflex. You will continue this for one week.     Subdural Hematoma A subdural hematoma is a collection of blood between the brain and its tough outer covering (dura). As the amount of blood increases, it puts pressure on the brain. There are two types of subdural hematomas:  Acute. This type develops shortly after a hard, direct hit (blow) to the head and causes blood to collect very quickly. This is a medical emergency. If it is not diagnosed and treated quickly, it can lead to severe brain injury or death.  Chronic. This is when bleeding develops more slowly, over weeks or months.  What are the causes? This condition is caused by bleeding (hemorrhage) from a broken (ruptured) blood vessel. In most cases, a blood vessel ruptures and bleeds because of injury (trauma) to the head, such as from a hard, direct hit. Head trauma can happen in:  Traffic accidents.  Falls.  Assaults.  Sport injuries.  In rare cases, hemorrhage can happen without a known cause (spontaneously), especially if you take blood thinners (anticoagulants). What increases the risk? This condition is more likely to develop in:  Older people.  Infants.  People who take blood thinners.  People who have injured their head.  People who abuse alcohol.  What are the signs or symptoms? Depending on the size of the hematoma, symptoms can vary from mild to severe and life-threatening. Symptoms in acute subdural hematoma can develop over minutes or  hours. Symptoms in chronic subdural hematoma may develop over weeks or months.  Headaches.  Nausea or vomiting.  Changes in vision, such as double vision or loss of vision.  Changes in speech.  Loss of balance or difficulty walking.  Weakness, numbness, or tingling in the arms or legs on one side of the body.  Jerky movements that you cannot control (seizures).  Change in personality.  Increased sleepiness.  Memory loss.  Loss of consciousness.  Coma.  How is this diagnosed? This condition is diagnosed based on the results of:  A physical and neurological exam.  CT scan.  MRI.  How is this treated? Treatment for this condition depends on the severity and the type of subdural hematoma that you have. You may need to temporarily stop taking blood thinners, if this applies. You may be given antiseizure (anticonvulsant) medicine. Treatment for acute subdural hematoma may include:  Medicines that help the body get rid of excess fluids (diuretics). These may help reduce pressure in the brain.  Assisted breathing (ventilation). This involves using a machine called a ventilator to help you breathe. This helps to reduce pressure in the brain, especially if there is swelling of the brain.  Emergency surgery to drain blood or remove a blood clot.  Treatment for chronic subdural hematoma may include:  Observation and bed rest at the hospital.  Emergency surgery. This may be done if the bleeding is large, or if you have neurological symptoms such as weakness or numbness.  Sometimes, no treatment is needed for chronic subdural hematoma. Follow these  instructions at home: Activity  Avoid any situation where there is potential for another head injury, such as football, hockey, soccer, basketball, martial arts, downhill snow sports, and horseback riding. Do not do these activities until your health care provider approves. ? If you play a contact sport and you experience a head  injury, follow advice from your health care provider about when you can return to the sport. If you get another injury while you are healing, you may experience another hemorrhage.  Avoid excessive visual stimulation while recovering. This includes working on the computer, watching TV, and reading.  Try to avoid activities that cause physical or mental stress. Stay home from work or school as directed by your health care provider.  Do not drive, ride a bicycle, or use heavy machinery until your health care provider approves.  Do not lift anything that is heavier than 5 lb (2.3 kg) until your health care provider approves.  If physical therapy was prescribed, do exercises as told by your health care provider or physical therapist.  Rest as told by your health care provider. Rest helps the brain to heal.  Make sure you: ? Get plenty of sleep. Avoid staying up late at night. ? Keep a consistent sleep schedule. Try to go to sleep and wake up at about the same time every day. General instructions  Recovery from brain injuries varies widely. Talk with your health care provider about what to expect. Monitor your symptoms, and ask people around you to do the same.  Take over-the-counter and prescription medicines only as told by your health care provider. Do not take blood thinners or NSAIDs unless your health care provider approves. This includes aspirin, ibuprofen, naproxen, and warfarin.  Limit alcohol intake to no more than 1 drink per day for nonpregnant women and 2 drinks per day for men. One drink equals 12 oz of beer, 5 oz of wine, or 1 oz of hard liquor.  Keep all follow-up visits as told by your health care provider. This is important. How is this prevented?  Wear protective gear, such as helmets, when participating in activities such as biking or contact sports.  Always wear a seat belt when you are in a motor vehicle.  Keep your home environment safe to reduce the risk of  falling: ? Remove clutter and tripping hazards from floors and stairways, such as loose rugs and extension cords. ? Use grab bars in bathrooms and handrails by stairs. ? Place non-slip mats on floors and in bathtubs. ? Improve lighting in dim areas. Where to find more information:  [employer] of Neurological Disorders and Stroke: MasterBoxes.it  American Association of Neurological Surgeons: http://www.aans.org  American Academy of Neurology (AAN): http://keith.biz/  Brain Injury Association of America: www.biausa.org Get help right away if:  You develop symptoms of subdural hematoma.  You are taking blood thinners and you fall or you experience minor trauma to the head. If you take any blood thinners, even a very small injury can cause a subdural hematoma. You should get help right away, even if you think your symptoms are mild.  You have a bleeding disorder and you fall or you experience minor trauma to the head.  You experience a head injury and you develop any of the following symptoms: ? Clear fluid draining from your nose or ears. ? Nausea. ? Vomiting. ? Slurred speech. ? Seizures. ? Drowsiness or a decrease in alertness. ? Double vision. ? Numbness or inability to move (paralysis) in any  part of your body. ? Difficulty walking or poor coordination. ? Difficulty thinking. ? Confusion or forgetfulness. ? Personality changes. ? Irrational or aggressive behavior. ? A history of heavy alcohol use. These symptoms may represent a serious problem that is an emergency. Do not wait to see if the symptoms will go away. Get medical help right away. Call your local emergency services (911 in the [place] Do not drive yourself to the hospital. Summary  A subdural hematoma is a collection of blood between the brain and its tough outer covering.  Treatment for this condition depends on the severity and the type of subdural hemorrhage that you have.  Symptoms can vary from mild  to severe and life-threatening.  Monitor your symptoms, and ask others around you to do the same. This information is not intended to replace advice given to you by your health care provider. Make sure you discuss any questions you have with your health care provider. Document Released: 08/10/2004 Document Revised: 08/28/2016 Document Reviewed: 08/28/2016 Elsevier Interactive Patient Education  [employer].

## 2017-07-09 NOTE — Care Management Obs Status (Signed)
[place] NOTIFICATION   Patient Details  Name: [name] MRN: [id number] Date of Birth: 1919/09/27   Medicare Observation Status Notification Given:  Yes    Erenest Rasher, RN 07/09/2017, 11:37 AM

## 2017-07-09 NOTE — Progress Notes (Signed)
[place] Pain Coach to be D/C'd to assisted living facility with [facility] per MD order.  Discussed with the patient and all questions fully answered.  VSS, Skin clean, dry and intact without evidence of skin break down, no evidence of skin tears noted. IV catheter discontinued intact. Site without signs and symptoms of complications. Dressing and pressure applied.  An After Visit Summary was printed and given to the patient. Patient/family received prescriptions.  D/c education completed with patient/family including follow up instructions, medication list, d/c activities limitations if indicated, with other d/c instructions as indicated by MD - patient able to verbalize understanding, all questions fully answered.   Patient instructed to return to ED, call 911, or call MD for any changes in condition.   Patient escorted via [place], and D/C home via private auto.  Morley Kos Price 07/09/2017 12:21 PM

## 2017-07-09 NOTE — Discharge Summary (Addendum)
Physician Discharge Summary  LEYLA SOLIZ EXH:[id number] DOB: 02-Jan-1919 DOA: 07/08/2017  PCP: Gaynelle Arabian, MD  Admit date: 07/08/2017 Discharge date: 07/09/2017  Admitted From: ALF Disposition: ALF  Recommendations for Outpatient Follow-up:  1. Follow up with PCP in 1 week 2. Please obtain BMP/CBC in one week 3. Readdress aspirin 4. Continue physical/occupational therapy 5. Please follow up on the following pending results: Urine culture   Discharge Condition: Stable CODE STATUS: DNR Diet recommendation: Heart healthy   Brief/Interim Summary:  Admission HPI written by Phillips Grout, MD   Chief Complaint:   ams , fall  HPI: [name] is a 83 y.o. female with medical history significant of HTN, frequent falls comes in with family with noted confusion and unsteady gait.  Pt fell at her assisted living Sunday (today Tuesday).  Today family went to check on her and noted she was not acting normal, was more confused than normal and more unsteady on her feet.  She normally walks with a walker.  They did not notice any facial abnormality or speech abnormality.  Her son is a Stage manager (at urgent care I think ) so arranged for a stat CT scan of her head which showed a small subdural hematoma.  She was then brought to the ED.  She vomited once in the car on the way to ED.  Family denies any fevers.  No urinary symptoms.  Her last uti was over 2 years ago and they report she gets really confused when she gets a uti.  Pt denies any issues, no pain.  No headache.  Pt referred for admission for monitoring for her subdural hematoma.   Hospital course:  Subdural hematoma Small. Secondary to fall. Neurosurgery consulted and recommended no repeat CT scan, in addition to starting Keppra for 1 week for seizure prophylaxis. Holding aspirin. Hemoglobin down slightly in tandem with all blood cell lines. Recommend repeat CBC as an outpatient. Mental status stable.  Frequent falls [facility]  use as an outpatient. Family and patient wanting to go back to ALF. Per family discussion, arrangements are being made to increase care at ALF.  Acute UTI Urinalysis suggests UTI. Asymptomatic but possibly contributed to fall. Started on Keflex. Continue for 7 days. Urine culture obtained and pending at discharge.  Acute metabolic encephalopathy Secondary to UTI. Resolved and at baseline.  Hypokalemia Given potassium supplementation. Outpatient follow-up.  Discharge Diagnoses:  Principal Problem:   Subdural hematoma ([place]) Active Problems:   Frequent falls   Acute lower UTI   Acute metabolic encephalopathy   Fall   Hypokalemia    Discharge Instructions  Discharge Instructions    Call MD for:  difficulty breathing, headache or visual disturbances    Complete by:  As directed    Call MD for:  extreme fatigue    Complete by:  As directed    Call MD for:  persistant dizziness or light-headedness    Complete by:  As directed    Diet - low sodium heart healthy    Complete by:  As directed    Increase activity slowly    Complete by:  As directed      Allergies as of 07/09/2017      Reactions   Dicloxacillin    UNKNOWN   Pork-derived Products    Pt does not eat pork   Septra [sulfamethoxazole-trimethoprim]    N/V      Medication List    STOP taking these medications   aspirin 81 MG tablet  TAKE these medications   acetaminophen 650 MG CR tablet Commonly known as:  TYLENOL Take 650 mg by mouth every 8 (eight) hours as needed for pain.   amLODipine 2.5 MG tablet Commonly known as:  NORVASC Take 2.5 mg by mouth daily.   CALTRATE 600+D 600-400 MG-UNIT tablet Generic drug:  Calcium Carbonate-Vitamin D Take 1 tablet by mouth daily.   cephALEXin 250 MG capsule Commonly known as:  KEFLEX Take 1 capsule (250 mg total) by mouth every 12 (twelve) hours.   levETIRAcetam 500 MG tablet Commonly known as:  KEPPRA Take 1 tablet (500 mg total) by mouth 2 (two) times  daily.   multivitamin-iron-minerals-folic acid chewable tablet Chew 1 tablet by mouth daily.      Follow-up Information    Gaynelle Arabian, MD. Schedule an appointment as soon as possible for a visit in 1 week(s).   Specialty:  Family Medicine Why:  Hospital follow-up Contact information: [address] [place] [address] [phone]          Allergies  Allergen Reactions  . Dicloxacillin     UNKNOWN  . Pork-Derived Products     Pt does not eat pork  . Septra [Sulfamethoxazole-Trimethoprim]     N/V    Consultations:  Neurosurgery   Procedures/Studies: Ct Head Wo Contrast  Addendum Date: 07/08/2017   ADDENDUM REPORT: 07/08/2017 19:21 ADDENDUM: Series 3, image 1 with subtle lucency at the junction of the basion and C1 ring. It is possible this is related to motion, degenerative changes and partial volume averaging however, cervical spine CT would be necessary to exclude fracture if of clinical concern. These results were called by telephone at the time of addendum on 07/08/2017 at 7:19 pm to Dr. Eulis Foster, who verbally acknowledged these results. Electronically Signed   By: Genia Del M.D.   On: 07/08/2017 19:21   Result Date: 07/08/2017 CLINICAL DATA:  83 year old hypertensive female post fall within the past few days. Unsteady and confused today. Initial encounter. EXAM: CT HEAD WITHOUT CONTRAST TECHNIQUE: Contiguous axial images were obtained from the base of the skull through the vertex without intravenous contrast. COMPARISON:  09/16/2010 FINDINGS: Brain: Broad-based right parafalcine subdural hematoma with maximal thickness frontal region of 5.9 mm. No significant mass effect upon adjacent brain parenchyma. Tiny left convexity extra-axial hyperdensity (series 601, image 27 and 602 image 35) possibly a small amount of subdural blood as versus tiny meningioma without mass effect. Prominent chronic microvascular changes without CT evidence of infarct. Global  atrophy without hydrocephalus. Vascular: Vascular calcifications Skull: No skull fracture Sinuses/Orbits: No acute orbital abnormality. Visualized paranasal sinuses are clear. Other: Mastoid air cells and middle ear cavities are clear. Right temporomandibular joint degenerative changes with minimal bony fragmentation without change. IMPRESSION: Broad-based right parafalcine subdural hematoma with maximal thickness frontal region of 5.9 mm. No significant mass effect upon adjacent brain parenchyma. Tiny left convexity extra-axial hyperdensity (series 601, image 27 and 602 image 35) possibly a small amount of subdural blood as versus tiny meningioma without mass effect. Prominent chronic microvascular changes without CT evidence of infarct. Global atrophy. These results were called by telephone at the time of interpretation on 07/08/2017 at 5:20 pm to Dr. Gaynelle Arabian , who verbally acknowledged these results. Electronically Signed: By: Genia Del M.D. On: 07/08/2017 18:08   Ct Cervical Spine Wo Contrast  Result Date: 07/08/2017 CLINICAL DATA:  History of fall with head bleed EXAM: CT CERVICAL SPINE WITHOUT CONTRAST TECHNIQUE: Multidetector CT imaging of the  cervical spine was performed without intravenous contrast. Multiplanar CT image reconstructions were also generated. COMPARISON:  Head CT 07/08/2017, CT cervical spine 11/15/2008 FINDINGS: Alignment: 5 mm anterolisthesis of C7 on T1 unchanged. Trace anterolisthesis of C3 on C4, also unchanged. Facet alignment is within normal limits. Skull base and vertebrae: Craniovertebral junction is intact. Focal defect within the anterior arch of C1 slightly to the right of midline not seen on comparison CT. Vertebral body heights appear normal. Soft tissues and spinal canal: No prevertebral fluid or swelling. No visible canal hematoma. Prominent pannus at the tip of the dens. Disc levels: Advanced degenerative changes from C4 through T1 with disc space narrowing,  endplate changes and osteophyte. Multilevel bilateral hypertrophic facet arthropathy with multiple level foraminal stenosis. Upper chest: Right apical scarring. Heterogeneous thyroid without dominant mass. Other: None IMPRESSION: 1. No change in alignment of the cervical spine with anterolisthesis of C3 on C4 and C7 on T1. 2. Focal defect in the anterior arch of C1, new since comparison CT from 2010, suspect that this represents progression of degenerative changes or inflammatory arthropathy at the C1-C2 articulation as opposed to a fracture. 3. Advanced degenerative changes. Electronically Signed   By: Donavan Foil M.D.   On: 07/08/2017 20:51     Subjective: No complaints today. Wants to leave.  Discharge Exam: Vitals:   07/09/17 0519 07/09/17 0940  BP: (!) 151/55 (!) 131/55  Pulse: 73 73  Resp: 20   Temp: 99.1 F (37.3 C)   SpO2: 100%    Vitals:   07/08/17 2030 07/08/17 2243 07/09/17 0519 07/09/17 0940  BP: 134/79 (!) 130/47 (!) 151/55 (!) 131/55  Pulse: 89 84 73 73  Resp: 15 16 20    Temp:  99.1 F (37.3 C) 99.1 F (37.3 C)   TempSrc:  Oral Oral   SpO2: 98% 94% 100%   Weight:  44.9 kg (99 lb)    Height:        General: Pt is alert, awake, not in acute distress Cardiovascular: RRR, S1/S2 +, no rubs, no gallops Respiratory: CTA bilaterally, no wheezing, no rhonchi Abdominal: Soft, NT, ND, bowel sounds + Extremities: no edema, no cyanosis Neuro: Alert, oriented. 4/5 strength   The results of significant diagnostics from this hospitalization (including imaging, microbiology, ancillary and laboratory) are listed below for reference.     Microbiology: Recent Results (from the past 240 hour(s))  MRSA PCR Screening     Status: None   Collection Time: 07/09/17 12:49 AM  Result Value Ref Range Status   MRSA by PCR NEGATIVE NEGATIVE Final    Comment:        The GeneXpert MRSA Assay (FDA approved for NASAL specimens only), is one component of a comprehensive MRSA  colonization surveillance program. It is not intended to diagnose MRSA infection nor to guide or monitor treatment for MRSA infections.      Labs: BNP (last 3 results) No results for input(s): BNP in the last 8760 hours. Basic Metabolic Panel:  Recent Labs Lab 07/08/17 1836 07/09/17 0649  NA 134* 138  K 3.2* 3.4*  CL 98* 104  CO2 25 26  GLUCOSE 120* 107*  BUN 10 10  CREATININE 0.81 0.83  CALCIUM 9.2 8.5*   Liver Function Tests: No results for input(s): AST, ALT, ALKPHOS, BILITOT, PROT, ALBUMIN in the last 168 hours. No results for input(s): LIPASE, AMYLASE in the last 168 hours. No results for input(s): AMMONIA in the last 168 hours. CBC:  Recent Labs Lab 07/08/17  1836 07/09/17 0649  WBC 11.3* 10.8*  NEUTROABS 8.1*  --   HGB 12.4 11.0*  HCT 37.3 34.1*  MCV 95.2 95.5  PLT 338 307   Cardiac Enzymes: No results for input(s): CKTOTAL, CKMB, CKMBINDEX, TROPONINI in the last 168 hours. BNP: Invalid input(s): POCBNP CBG: No results for input(s): GLUCAP in the last 168 hours. D-Dimer No results for input(s): DDIMER in the last 72 hours. Hgb A1c No results for input(s): HGBA1C in the last 72 hours. Lipid Profile No results for input(s): CHOL, HDL, LDLCALC, TRIG, CHOLHDL, LDLDIRECT in the last 72 hours. Thyroid function studies No results for input(s): TSH, T4TOTAL, T3FREE, THYROIDAB in the last 72 hours.  Invalid input(s): FREET3 Anemia work up No results for input(s): VITAMINB12, FOLATE, FERRITIN, TIBC, IRON, RETICCTPCT in the last 72 hours. Urinalysis    Component Value Date/Time   COLORURINE YELLOW 07/08/2017 1920   APPEARANCEUR CLOUDY (A) 07/08/2017 1920   LABSPEC 1.010 07/08/2017 1920   PHURINE 7.0 07/08/2017 1920   GLUCOSEU NEGATIVE 07/08/2017 1920   HGBUR SMALL (A) 07/08/2017 1920   BILIRUBINUR NEGATIVE 07/08/2017 1920   KETONESUR NEGATIVE 07/08/2017 1920   PROTEINUR NEGATIVE 07/08/2017 1920   UROBILINOGEN 1.0 09/16/2010 1226   NITRITE POSITIVE  (A) 07/08/2017 1920   LEUKOCYTESUR LARGE (A) 07/08/2017 1920   Sepsis Labs Invalid input(s): PROCALCITONIN,  WBC,  LACTICIDVEN Microbiology Recent Results (from the past 240 hour(s))  MRSA PCR Screening     Status: None   Collection Time: 07/09/17 12:49 AM  Result Value Ref Range Status   MRSA by PCR NEGATIVE NEGATIVE Final    Comment:        The GeneXpert MRSA Assay (FDA approved for NASAL specimens only), is one component of a comprehensive MRSA colonization surveillance program. It is not intended to diagnose MRSA infection nor to guide or monitor treatment for MRSA infections.      Time coordinating discharge: Over 30 minutes  SIGNED:   Cordelia Poche, MD [facility] 07/09/2017, 10:49 AM Pager [phone]  If 7PM-7AM, please contact night-coverage [email or web address] Password [email or web address]

## 2017-07-09 NOTE — Evaluation (Signed)
Physical Therapy Evaluation Patient Details Name: [name] MRN: [id number] DOB: [birth year] Today's Date: 07/09/2017   History of Present Illness  Pt is 83 y/o female admitted secondary to UTI and R parafalcine subdural hematoma. Had a fall sunday which resulted in small subdural hematoma. CT of the cervical spine showed degenerative changes at C1 and no change in cervical spine alignment of C3 on C4 and C7 on T1. PMH includes nerological gait disturbance, HTN, basal cell cancer, depression, osteoporosis, s/p lumbar fusion, and s/p hip fracture surgery.   Clinical Impression  Pt admitted secondary to problem above with deficits below. PTA, pt's daughter reports pt was independent with ambulation with RW, however, needed assist with ADL tasks. Lives at [facility], however, has assist when needed. Upon eval, pt with AMS (family reports improved since yesterday), decreased balance, decreased strength, and decreased safety awareness. REquired min A for mobility this session. Educated family about need for assist at d/c from staff and pt using [place] when staff unavailable. Pt's daughter reports they have set up increased assist from staff and therapies for pt's return to ILF. Recommending follow up therapies below to increase independence and safety with functional mobility. Will continue to follow acutely to maximize functional mobility independence and safety.     Follow Up Recommendations Home health PT;Other (comment) (HHOT (active with [facility]; return to [facility]) )    Equipment Recommendations       Recommendations for Other Services OT consult     Precautions / Restrictions Precautions Precautions: Fall Precaution Comments: Had a fall on sunday; Baseline neurological gait disorder per daughter  Restrictions Weight Bearing Restrictions: No      Mobility  Bed Mobility               General bed mobility comments: In chair upon entry   Transfers Overall transfer level:  Needs assistance Equipment used: Rolling walker (2 wheeled) Transfers: Sit to/from Stand Sit to Stand: Min assist         General transfer comment: Min A for lift assist. Verbal cues for safe hand placement and to back up to chair before sitting.   Ambulation/Gait Ambulation/Gait assistance: Min assist Ambulation Distance (Feet): 25 Feet Assistive device: Rolling walker (2 wheeled) Gait Pattern/deviations: Step-through pattern;Decreased stride length;Drifts right/left Gait velocity: Decreased Gait velocity interpretation: Below normal speed for age/gender General Gait Details: Slow, unsteady gait. Min A for steadying and RW management. Required verbal cues for proximity to device. Per family pt is close to her baseline because she has gait disturbances at baseline. Educated about using WC when no one is available to assist pt with mobility and importance of having staff with pt when she is up and walking. Pt family agreeable.   Stairs            Wheelchair Mobility    Modified Rankin (Stroke Patients Only)       Balance Overall balance assessment: Needs assistance Sitting-balance support: No upper extremity supported;Feet supported Sitting balance-Leahy Scale: Fair     Standing balance support: Bilateral upper extremity supported;During functional activity Standing balance-Leahy Scale: Poor Standing balance comment: Reliant on UE support for balance.                              Pertinent Vitals/Pain Pain Assessment: No/denies pain    Home Living Family/patient expects to be discharged to:: Private residence (Independent living ) Living Arrangements: Alone Available Help at Discharge: Personal  care attendant Type of Home: Independent living facility         Home Equipment: Gilford Rile - 2 wheels;Wheelchair - [employer] - 4 wheels Additional Comments: From [facility]. Per daughter pt will have checks every 2 hours at facility from  staff; will have assist with bathing and dressing and mobility. Reports pt's son is coming to check in on her as well. [facility] with [facility] as well, daughter reports those will be increasing upon return to facility.    Prior Function Level of Independence: Needs assistance   Gait / Transfers Assistance Needed: Used RW for ambulation; will have help with walking. Also has WC but does not like to use.   ADL's / Homemaking Assistance Needed: Assist with bathing/dressing         Hand Dominance        Extremity/Trunk Assessment   Upper Extremity Assessment Upper Extremity Assessment: Generalized weakness    Lower Extremity Assessment Lower Extremity Assessment: Generalized weakness    Cervical / Trunk Assessment Cervical / Trunk Assessment: Kyphotic  Communication   Communication: HOH  Cognition Arousal/Alertness: Awake/alert Behavior During Therapy: WFL for tasks assessed/performed Overall Cognitive Status: Impaired/Different from baseline Area of Impairment: Memory;Following commands;Problem solving;Safety/judgement;Awareness                     Memory: Decreased short-term memory;Decreased recall of precautions Following Commands: Follows one step commands with increased time Safety/Judgement: Decreased awareness of deficits;Decreased awareness of safety Awareness: Emergent Problem Solving: Slow processing;Decreased initiation;Difficulty sequencing;Requires tactile cues;Requires verbal cues General Comments: Pt family reports pt has improved since admission, however, is still altered.       General Comments General comments (skin integrity, edema, etc.): Pt family very eager to get pt back to facility. Educated about adding HHOT to therapies and pt family agreeable.     Exercises     Assessment/Plan    PT Assessment Patient needs continued PT services  PT Problem List Decreased strength;Decreased balance;Decreased mobility;Decreased  cognition;Decreased knowledge of use of DME;Decreased safety awareness;Decreased knowledge of precautions       PT Treatment Interventions DME instruction;Gait training;Functional mobility training;Therapeutic activities;Therapeutic exercise;Balance training;Neuromuscular re-education;Patient/family education;Cognitive remediation    PT Goals (Current goals can be found in the Care Plan section)  Acute Rehab PT Goals Patient Stated Goal: none stated; family wants to get pt back to her facility ASAP  PT Goal Formulation: With family Time For Goal Achievement: 07/16/17 Potential to Achieve Goals: Good    Frequency Min 3X/week   Barriers to discharge        Co-evaluation               AM-PAC PT "6 Clicks" Daily Activity  Outcome Measure Difficulty turning over in bed (including adjusting bedclothes, sheets and blankets)?: A Little Difficulty moving from lying on back to sitting on the side of the bed? : A Lot Difficulty sitting down on and standing up from a chair with arms (e.g., wheelchair, bedside commode, etc,.)?: Unable Help needed moving to and from a bed to chair (including a wheelchair)?: A Little Help needed walking in hospital room?: A Little Help needed climbing 3-5 steps with a railing? : A Lot 6 Click Score: 14    End of Session Equipment Utilized During Treatment: Gait belt Activity Tolerance: Patient tolerated treatment well Patient left: in chair;with call bell/phone within reach;with family/visitor present Nurse Communication: Mobility status;Other (comment) (Pt family requesting to d/c pt ASAP ) PT Visit Diagnosis: Unsteadiness  on feet (R26.81);History of falling (Z91.81);Other symptoms and signs involving the nervous system (R29.898);Difficulty in walking, not elsewhere classified (R26.2)    Time: [id number] PT Time Calculation (min) (ACUTE ONLY): 23 min   Charges:   PT Evaluation $PT Eval Low Complexity: 1 Low PT Treatments $Gait Training: 8-22  mins   PT G Codes:   PT G-Codes **NOT FOR INPATIENT CLASS** Functional Assessment Tool Used: AM-PAC 6 Clicks Basic Mobility;Clinical judgement Functional Limitation: Mobility: Walking and moving around Mobility: Walking and Moving Around Current Status (X4481): At least 40 percent but less than 60 percent impaired, limited or restricted Mobility: Walking and Moving Around Goal Status [phone]): At least 1 percent but less than 20 percent impaired, limited or restricted    Leighton Ruff, PT, DPT  Acute Rehabilitation Services  Pager: [place] 07/09/2017, 12:05 PM

## 2017-07-09 NOTE — Care Management Note (Addendum)
Case Management Note  Patient Details  Name: [name] MRN: [id number] Date of Birth: 11/09/1918  Subjective/Objective:     AMS, fall, UTI               Action/Plan: Discharge Planning: NCM spoke to pt's dtr, [name] and son-in-law at bedside. Pt lives at [facility]. She lives has personal care assistant that assist everyday with ADL's. She was getting [facility]PT with [facility] (in [place] with [facility]) PT/OT recommending pt continue with HH and also add HHOT. NCM notified attending to update dc summary for resumption of care of HHPT and add HHOT. Dtr states she will traveling out of the country for 10 days but her brother will be here to manage and assist with her care. Pt has RW. HH orders sent with dtr.  PCP Gaynelle Arabian MD  Expected Discharge Date:  07/09/17               Expected Discharge Plan:  [place]  In-House Referral:  NA  Discharge planning Services  CM Consult  Post Acute Care Choice:  Home Health, Resumption of Svcs/PTA Provider Choice offered to:  Adult Children  DME Arranged:  N/A DME Agency:  NA  HH Arranged:  PT, OT HH Agency:  Other - See comment  Status of Service:  Completed, signed off  If discussed at [place] of Stay Meetings, dates discussed:    Additional Comments:  Erenest Rasher, RN 07/09/2017, 11:32 AM

## 2017-07-10 DIAGNOSIS — M6281 Muscle weakness (generalized): Secondary | ICD-10-CM | POA: Diagnosis not present

## 2017-07-10 DIAGNOSIS — R278 Other lack of coordination: Secondary | ICD-10-CM | POA: Diagnosis not present

## 2017-07-10 DIAGNOSIS — R262 Difficulty in walking, not elsewhere classified: Secondary | ICD-10-CM | POA: Diagnosis not present

## 2017-07-10 DIAGNOSIS — R2689 Other abnormalities of gait and mobility: Secondary | ICD-10-CM | POA: Diagnosis not present

## 2017-07-10 DIAGNOSIS — N3946 Mixed incontinence: Secondary | ICD-10-CM | POA: Diagnosis not present

## 2017-07-11 DIAGNOSIS — R262 Difficulty in walking, not elsewhere classified: Secondary | ICD-10-CM | POA: Diagnosis not present

## 2017-07-11 DIAGNOSIS — M6281 Muscle weakness (generalized): Secondary | ICD-10-CM | POA: Diagnosis not present

## 2017-07-11 DIAGNOSIS — R278 Other lack of coordination: Secondary | ICD-10-CM | POA: Diagnosis not present

## 2017-07-11 DIAGNOSIS — R2689 Other abnormalities of gait and mobility: Secondary | ICD-10-CM | POA: Diagnosis not present

## 2017-07-11 DIAGNOSIS — N3946 Mixed incontinence: Secondary | ICD-10-CM | POA: Diagnosis not present

## 2017-07-11 LAB — URINE CULTURE

## 2017-07-14 DIAGNOSIS — M6281 Muscle weakness (generalized): Secondary | ICD-10-CM | POA: Diagnosis not present

## 2017-07-14 DIAGNOSIS — R262 Difficulty in walking, not elsewhere classified: Secondary | ICD-10-CM | POA: Diagnosis not present

## 2017-07-14 DIAGNOSIS — R278 Other lack of coordination: Secondary | ICD-10-CM | POA: Diagnosis not present

## 2017-07-14 DIAGNOSIS — N3946 Mixed incontinence: Secondary | ICD-10-CM | POA: Diagnosis not present

## 2017-07-14 DIAGNOSIS — R2689 Other abnormalities of gait and mobility: Secondary | ICD-10-CM | POA: Diagnosis not present

## 2017-07-15 DIAGNOSIS — N3946 Mixed incontinence: Secondary | ICD-10-CM | POA: Diagnosis not present

## 2017-07-15 DIAGNOSIS — R278 Other lack of coordination: Secondary | ICD-10-CM | POA: Diagnosis not present

## 2017-07-15 DIAGNOSIS — R262 Difficulty in walking, not elsewhere classified: Secondary | ICD-10-CM | POA: Diagnosis not present

## 2017-07-15 DIAGNOSIS — M6281 Muscle weakness (generalized): Secondary | ICD-10-CM | POA: Diagnosis not present

## 2017-07-15 DIAGNOSIS — R2689 Other abnormalities of gait and mobility: Secondary | ICD-10-CM | POA: Diagnosis not present

## 2017-07-16 DIAGNOSIS — N3946 Mixed incontinence: Secondary | ICD-10-CM | POA: Diagnosis not present

## 2017-07-16 DIAGNOSIS — R2689 Other abnormalities of gait and mobility: Secondary | ICD-10-CM | POA: Diagnosis not present

## 2017-07-16 DIAGNOSIS — R262 Difficulty in walking, not elsewhere classified: Secondary | ICD-10-CM | POA: Diagnosis not present

## 2017-07-16 DIAGNOSIS — M6281 Muscle weakness (generalized): Secondary | ICD-10-CM | POA: Diagnosis not present

## 2017-07-16 DIAGNOSIS — R278 Other lack of coordination: Secondary | ICD-10-CM | POA: Diagnosis not present

## 2017-07-17 DIAGNOSIS — R262 Difficulty in walking, not elsewhere classified: Secondary | ICD-10-CM | POA: Diagnosis not present

## 2017-07-17 DIAGNOSIS — N3946 Mixed incontinence: Secondary | ICD-10-CM | POA: Diagnosis not present

## 2017-07-17 DIAGNOSIS — M6281 Muscle weakness (generalized): Secondary | ICD-10-CM | POA: Diagnosis not present

## 2017-07-17 DIAGNOSIS — R278 Other lack of coordination: Secondary | ICD-10-CM | POA: Diagnosis not present

## 2017-07-17 DIAGNOSIS — R2689 Other abnormalities of gait and mobility: Secondary | ICD-10-CM | POA: Diagnosis not present

## 2017-07-18 DIAGNOSIS — N3946 Mixed incontinence: Secondary | ICD-10-CM | POA: Diagnosis not present

## 2017-07-18 DIAGNOSIS — M6281 Muscle weakness (generalized): Secondary | ICD-10-CM | POA: Diagnosis not present

## 2017-07-18 DIAGNOSIS — R262 Difficulty in walking, not elsewhere classified: Secondary | ICD-10-CM | POA: Diagnosis not present

## 2017-07-18 DIAGNOSIS — R2689 Other abnormalities of gait and mobility: Secondary | ICD-10-CM | POA: Diagnosis not present

## 2017-07-18 DIAGNOSIS — R278 Other lack of coordination: Secondary | ICD-10-CM | POA: Diagnosis not present

## 2017-07-21 DIAGNOSIS — E876 Hypokalemia: Secondary | ICD-10-CM | POA: Diagnosis not present

## 2017-07-21 DIAGNOSIS — R262 Difficulty in walking, not elsewhere classified: Secondary | ICD-10-CM | POA: Diagnosis not present

## 2017-07-21 DIAGNOSIS — M6281 Muscle weakness (generalized): Secondary | ICD-10-CM | POA: Diagnosis not present

## 2017-07-21 DIAGNOSIS — R2689 Other abnormalities of gait and mobility: Secondary | ICD-10-CM | POA: Diagnosis not present

## 2017-07-21 DIAGNOSIS — R410 Disorientation, unspecified: Secondary | ICD-10-CM | POA: Diagnosis not present

## 2017-07-21 DIAGNOSIS — I6200 Nontraumatic subdural hemorrhage, unspecified: Secondary | ICD-10-CM | POA: Diagnosis not present

## 2017-07-21 DIAGNOSIS — D649 Anemia, unspecified: Secondary | ICD-10-CM | POA: Diagnosis not present

## 2017-07-21 DIAGNOSIS — N3946 Mixed incontinence: Secondary | ICD-10-CM | POA: Diagnosis not present

## 2017-07-21 DIAGNOSIS — N390 Urinary tract infection, site not specified: Secondary | ICD-10-CM | POA: Diagnosis not present

## 2017-07-21 DIAGNOSIS — R278 Other lack of coordination: Secondary | ICD-10-CM | POA: Diagnosis not present

## 2017-07-22 DIAGNOSIS — R2689 Other abnormalities of gait and mobility: Secondary | ICD-10-CM | POA: Diagnosis not present

## 2017-07-22 DIAGNOSIS — R262 Difficulty in walking, not elsewhere classified: Secondary | ICD-10-CM | POA: Diagnosis not present

## 2017-07-22 DIAGNOSIS — N3946 Mixed incontinence: Secondary | ICD-10-CM | POA: Diagnosis not present

## 2017-07-22 DIAGNOSIS — R278 Other lack of coordination: Secondary | ICD-10-CM | POA: Diagnosis not present

## 2017-07-22 DIAGNOSIS — M6281 Muscle weakness (generalized): Secondary | ICD-10-CM | POA: Diagnosis not present

## 2017-07-23 DIAGNOSIS — R278 Other lack of coordination: Secondary | ICD-10-CM | POA: Diagnosis not present

## 2017-07-23 DIAGNOSIS — M6281 Muscle weakness (generalized): Secondary | ICD-10-CM | POA: Diagnosis not present

## 2017-07-23 DIAGNOSIS — R262 Difficulty in walking, not elsewhere classified: Secondary | ICD-10-CM | POA: Diagnosis not present

## 2017-07-23 DIAGNOSIS — R2689 Other abnormalities of gait and mobility: Secondary | ICD-10-CM | POA: Diagnosis not present

## 2017-07-23 DIAGNOSIS — N3946 Mixed incontinence: Secondary | ICD-10-CM | POA: Diagnosis not present

## 2017-07-24 DIAGNOSIS — M6281 Muscle weakness (generalized): Secondary | ICD-10-CM | POA: Diagnosis not present

## 2017-07-24 DIAGNOSIS — N3946 Mixed incontinence: Secondary | ICD-10-CM | POA: Diagnosis not present

## 2017-07-24 DIAGNOSIS — R262 Difficulty in walking, not elsewhere classified: Secondary | ICD-10-CM | POA: Diagnosis not present

## 2017-07-24 DIAGNOSIS — R2689 Other abnormalities of gait and mobility: Secondary | ICD-10-CM | POA: Diagnosis not present

## 2017-07-24 DIAGNOSIS — R278 Other lack of coordination: Secondary | ICD-10-CM | POA: Diagnosis not present

## 2017-07-25 DIAGNOSIS — R278 Other lack of coordination: Secondary | ICD-10-CM | POA: Diagnosis not present

## 2017-07-25 DIAGNOSIS — R262 Difficulty in walking, not elsewhere classified: Secondary | ICD-10-CM | POA: Diagnosis not present

## 2017-07-25 DIAGNOSIS — M6281 Muscle weakness (generalized): Secondary | ICD-10-CM | POA: Diagnosis not present

## 2017-07-25 DIAGNOSIS — N3946 Mixed incontinence: Secondary | ICD-10-CM | POA: Diagnosis not present

## 2017-07-25 DIAGNOSIS — R2689 Other abnormalities of gait and mobility: Secondary | ICD-10-CM | POA: Diagnosis not present

## 2017-07-28 DIAGNOSIS — R262 Difficulty in walking, not elsewhere classified: Secondary | ICD-10-CM | POA: Diagnosis not present

## 2017-07-28 DIAGNOSIS — N3946 Mixed incontinence: Secondary | ICD-10-CM | POA: Diagnosis not present

## 2017-07-28 DIAGNOSIS — M6281 Muscle weakness (generalized): Secondary | ICD-10-CM | POA: Diagnosis not present

## 2017-07-28 DIAGNOSIS — R278 Other lack of coordination: Secondary | ICD-10-CM | POA: Diagnosis not present

## 2017-07-28 DIAGNOSIS — R2689 Other abnormalities of gait and mobility: Secondary | ICD-10-CM | POA: Diagnosis not present

## 2017-07-30 DIAGNOSIS — R2689 Other abnormalities of gait and mobility: Secondary | ICD-10-CM | POA: Diagnosis not present

## 2017-07-30 DIAGNOSIS — M6281 Muscle weakness (generalized): Secondary | ICD-10-CM | POA: Diagnosis not present

## 2017-07-30 DIAGNOSIS — N3946 Mixed incontinence: Secondary | ICD-10-CM | POA: Diagnosis not present

## 2017-07-30 DIAGNOSIS — R278 Other lack of coordination: Secondary | ICD-10-CM | POA: Diagnosis not present

## 2017-07-30 DIAGNOSIS — R262 Difficulty in walking, not elsewhere classified: Secondary | ICD-10-CM | POA: Diagnosis not present

## 2017-07-31 DIAGNOSIS — N3946 Mixed incontinence: Secondary | ICD-10-CM | POA: Diagnosis not present

## 2017-07-31 DIAGNOSIS — R278 Other lack of coordination: Secondary | ICD-10-CM | POA: Diagnosis not present

## 2017-07-31 DIAGNOSIS — M6281 Muscle weakness (generalized): Secondary | ICD-10-CM | POA: Diagnosis not present

## 2017-07-31 DIAGNOSIS — R2689 Other abnormalities of gait and mobility: Secondary | ICD-10-CM | POA: Diagnosis not present

## 2017-07-31 DIAGNOSIS — R262 Difficulty in walking, not elsewhere classified: Secondary | ICD-10-CM | POA: Diagnosis not present

## 2017-08-01 DIAGNOSIS — R262 Difficulty in walking, not elsewhere classified: Secondary | ICD-10-CM | POA: Diagnosis not present

## 2017-08-01 DIAGNOSIS — N3946 Mixed incontinence: Secondary | ICD-10-CM | POA: Diagnosis not present

## 2017-08-01 DIAGNOSIS — R278 Other lack of coordination: Secondary | ICD-10-CM | POA: Diagnosis not present

## 2017-08-01 DIAGNOSIS — M6281 Muscle weakness (generalized): Secondary | ICD-10-CM | POA: Diagnosis not present

## 2017-08-01 DIAGNOSIS — R2689 Other abnormalities of gait and mobility: Secondary | ICD-10-CM | POA: Diagnosis not present

## 2017-08-04 DIAGNOSIS — M6281 Muscle weakness (generalized): Secondary | ICD-10-CM | POA: Diagnosis not present

## 2017-08-04 DIAGNOSIS — N3946 Mixed incontinence: Secondary | ICD-10-CM | POA: Diagnosis not present

## 2017-08-04 DIAGNOSIS — R278 Other lack of coordination: Secondary | ICD-10-CM | POA: Diagnosis not present

## 2017-08-04 DIAGNOSIS — R262 Difficulty in walking, not elsewhere classified: Secondary | ICD-10-CM | POA: Diagnosis not present

## 2017-08-04 DIAGNOSIS — R2689 Other abnormalities of gait and mobility: Secondary | ICD-10-CM | POA: Diagnosis not present

## 2017-08-06 DIAGNOSIS — R278 Other lack of coordination: Secondary | ICD-10-CM | POA: Diagnosis not present

## 2017-08-06 DIAGNOSIS — N3946 Mixed incontinence: Secondary | ICD-10-CM | POA: Diagnosis not present

## 2017-08-06 DIAGNOSIS — R262 Difficulty in walking, not elsewhere classified: Secondary | ICD-10-CM | POA: Diagnosis not present

## 2017-08-06 DIAGNOSIS — R2689 Other abnormalities of gait and mobility: Secondary | ICD-10-CM | POA: Diagnosis not present

## 2017-08-06 DIAGNOSIS — M6281 Muscle weakness (generalized): Secondary | ICD-10-CM | POA: Diagnosis not present

## 2017-08-07 DIAGNOSIS — N390 Urinary tract infection, site not specified: Secondary | ICD-10-CM | POA: Diagnosis not present

## 2017-08-07 DIAGNOSIS — R2689 Other abnormalities of gait and mobility: Secondary | ICD-10-CM | POA: Diagnosis not present

## 2017-08-07 DIAGNOSIS — N3946 Mixed incontinence: Secondary | ICD-10-CM | POA: Diagnosis not present

## 2017-08-07 DIAGNOSIS — R262 Difficulty in walking, not elsewhere classified: Secondary | ICD-10-CM | POA: Diagnosis not present

## 2017-08-07 DIAGNOSIS — R278 Other lack of coordination: Secondary | ICD-10-CM | POA: Diagnosis not present

## 2017-08-07 DIAGNOSIS — M6281 Muscle weakness (generalized): Secondary | ICD-10-CM | POA: Diagnosis not present

## 2017-08-11 DIAGNOSIS — N3946 Mixed incontinence: Secondary | ICD-10-CM | POA: Diagnosis not present

## 2017-08-11 DIAGNOSIS — R262 Difficulty in walking, not elsewhere classified: Secondary | ICD-10-CM | POA: Diagnosis not present

## 2017-08-11 DIAGNOSIS — R278 Other lack of coordination: Secondary | ICD-10-CM | POA: Diagnosis not present

## 2017-08-11 DIAGNOSIS — R2689 Other abnormalities of gait and mobility: Secondary | ICD-10-CM | POA: Diagnosis not present

## 2017-08-11 DIAGNOSIS — M6281 Muscle weakness (generalized): Secondary | ICD-10-CM | POA: Diagnosis not present

## 2017-08-13 DIAGNOSIS — N3946 Mixed incontinence: Secondary | ICD-10-CM | POA: Diagnosis not present

## 2017-08-13 DIAGNOSIS — R262 Difficulty in walking, not elsewhere classified: Secondary | ICD-10-CM | POA: Diagnosis not present

## 2017-08-13 DIAGNOSIS — R278 Other lack of coordination: Secondary | ICD-10-CM | POA: Diagnosis not present

## 2017-08-13 DIAGNOSIS — R2689 Other abnormalities of gait and mobility: Secondary | ICD-10-CM | POA: Diagnosis not present

## 2017-08-13 DIAGNOSIS — M6281 Muscle weakness (generalized): Secondary | ICD-10-CM | POA: Diagnosis not present

## 2017-08-14 DIAGNOSIS — R262 Difficulty in walking, not elsewhere classified: Secondary | ICD-10-CM | POA: Diagnosis not present

## 2017-08-14 DIAGNOSIS — M6281 Muscle weakness (generalized): Secondary | ICD-10-CM | POA: Diagnosis not present

## 2017-08-14 DIAGNOSIS — R2689 Other abnormalities of gait and mobility: Secondary | ICD-10-CM | POA: Diagnosis not present

## 2017-08-14 DIAGNOSIS — N3946 Mixed incontinence: Secondary | ICD-10-CM | POA: Diagnosis not present

## 2017-08-14 DIAGNOSIS — R278 Other lack of coordination: Secondary | ICD-10-CM | POA: Diagnosis not present

## 2017-08-18 DIAGNOSIS — R278 Other lack of coordination: Secondary | ICD-10-CM | POA: Diagnosis not present

## 2017-08-18 DIAGNOSIS — R2689 Other abnormalities of gait and mobility: Secondary | ICD-10-CM | POA: Diagnosis not present

## 2017-08-18 DIAGNOSIS — R262 Difficulty in walking, not elsewhere classified: Secondary | ICD-10-CM | POA: Diagnosis not present

## 2017-08-18 DIAGNOSIS — M6281 Muscle weakness (generalized): Secondary | ICD-10-CM | POA: Diagnosis not present

## 2017-08-18 DIAGNOSIS — N3946 Mixed incontinence: Secondary | ICD-10-CM | POA: Diagnosis not present

## 2017-08-20 DIAGNOSIS — R2689 Other abnormalities of gait and mobility: Secondary | ICD-10-CM | POA: Diagnosis not present

## 2017-08-20 DIAGNOSIS — R262 Difficulty in walking, not elsewhere classified: Secondary | ICD-10-CM | POA: Diagnosis not present

## 2017-08-20 DIAGNOSIS — N3946 Mixed incontinence: Secondary | ICD-10-CM | POA: Diagnosis not present

## 2017-08-20 DIAGNOSIS — M6281 Muscle weakness (generalized): Secondary | ICD-10-CM | POA: Diagnosis not present

## 2017-08-20 DIAGNOSIS — R278 Other lack of coordination: Secondary | ICD-10-CM | POA: Diagnosis not present

## 2017-08-25 DIAGNOSIS — R262 Difficulty in walking, not elsewhere classified: Secondary | ICD-10-CM | POA: Diagnosis not present

## 2017-08-25 DIAGNOSIS — N3946 Mixed incontinence: Secondary | ICD-10-CM | POA: Diagnosis not present

## 2017-08-25 DIAGNOSIS — R278 Other lack of coordination: Secondary | ICD-10-CM | POA: Diagnosis not present

## 2017-08-25 DIAGNOSIS — R2689 Other abnormalities of gait and mobility: Secondary | ICD-10-CM | POA: Diagnosis not present

## 2017-08-25 DIAGNOSIS — M6281 Muscle weakness (generalized): Secondary | ICD-10-CM | POA: Diagnosis not present

## 2017-08-27 DIAGNOSIS — R262 Difficulty in walking, not elsewhere classified: Secondary | ICD-10-CM | POA: Diagnosis not present

## 2017-08-27 DIAGNOSIS — R2689 Other abnormalities of gait and mobility: Secondary | ICD-10-CM | POA: Diagnosis not present

## 2017-08-27 DIAGNOSIS — R278 Other lack of coordination: Secondary | ICD-10-CM | POA: Diagnosis not present

## 2017-08-27 DIAGNOSIS — M6281 Muscle weakness (generalized): Secondary | ICD-10-CM | POA: Diagnosis not present

## 2017-08-27 DIAGNOSIS — N3946 Mixed incontinence: Secondary | ICD-10-CM | POA: Diagnosis not present

## 2017-09-01 DIAGNOSIS — R262 Difficulty in walking, not elsewhere classified: Secondary | ICD-10-CM | POA: Diagnosis not present

## 2017-09-01 DIAGNOSIS — M6281 Muscle weakness (generalized): Secondary | ICD-10-CM | POA: Diagnosis not present

## 2017-09-01 DIAGNOSIS — N3946 Mixed incontinence: Secondary | ICD-10-CM | POA: Diagnosis not present

## 2017-09-01 DIAGNOSIS — R278 Other lack of coordination: Secondary | ICD-10-CM | POA: Diagnosis not present

## 2017-09-01 DIAGNOSIS — R2689 Other abnormalities of gait and mobility: Secondary | ICD-10-CM | POA: Diagnosis not present

## 2017-09-03 DIAGNOSIS — R2689 Other abnormalities of gait and mobility: Secondary | ICD-10-CM | POA: Diagnosis not present

## 2017-09-03 DIAGNOSIS — R278 Other lack of coordination: Secondary | ICD-10-CM | POA: Diagnosis not present

## 2017-09-03 DIAGNOSIS — R262 Difficulty in walking, not elsewhere classified: Secondary | ICD-10-CM | POA: Diagnosis not present

## 2017-09-03 DIAGNOSIS — N3946 Mixed incontinence: Secondary | ICD-10-CM | POA: Diagnosis not present

## 2017-09-03 DIAGNOSIS — M6281 Muscle weakness (generalized): Secondary | ICD-10-CM | POA: Diagnosis not present

## 2017-09-04 DIAGNOSIS — R262 Difficulty in walking, not elsewhere classified: Secondary | ICD-10-CM | POA: Diagnosis not present

## 2017-09-04 DIAGNOSIS — M6281 Muscle weakness (generalized): Secondary | ICD-10-CM | POA: Diagnosis not present

## 2017-09-04 DIAGNOSIS — N3946 Mixed incontinence: Secondary | ICD-10-CM | POA: Diagnosis not present

## 2017-09-04 DIAGNOSIS — R2689 Other abnormalities of gait and mobility: Secondary | ICD-10-CM | POA: Diagnosis not present

## 2017-09-04 DIAGNOSIS — R278 Other lack of coordination: Secondary | ICD-10-CM | POA: Diagnosis not present

## 2017-09-08 DIAGNOSIS — M6281 Muscle weakness (generalized): Secondary | ICD-10-CM | POA: Diagnosis not present

## 2017-09-08 DIAGNOSIS — R262 Difficulty in walking, not elsewhere classified: Secondary | ICD-10-CM | POA: Diagnosis not present

## 2017-09-08 DIAGNOSIS — R2689 Other abnormalities of gait and mobility: Secondary | ICD-10-CM | POA: Diagnosis not present

## 2017-09-08 DIAGNOSIS — R278 Other lack of coordination: Secondary | ICD-10-CM | POA: Diagnosis not present

## 2017-09-10 DIAGNOSIS — R262 Difficulty in walking, not elsewhere classified: Secondary | ICD-10-CM | POA: Diagnosis not present

## 2017-09-10 DIAGNOSIS — R278 Other lack of coordination: Secondary | ICD-10-CM | POA: Diagnosis not present

## 2017-09-10 DIAGNOSIS — M6281 Muscle weakness (generalized): Secondary | ICD-10-CM | POA: Diagnosis not present

## 2017-09-10 DIAGNOSIS — R2689 Other abnormalities of gait and mobility: Secondary | ICD-10-CM | POA: Diagnosis not present

## 2017-11-18 DIAGNOSIS — L821 Other seborrheic keratosis: Secondary | ICD-10-CM | POA: Diagnosis not present

## 2017-11-18 DIAGNOSIS — Z85828 Personal history of other malignant neoplasm of skin: Secondary | ICD-10-CM | POA: Diagnosis not present

## 2017-11-18 DIAGNOSIS — L570 Actinic keratosis: Secondary | ICD-10-CM | POA: Diagnosis not present

## 2017-11-18 DIAGNOSIS — D485 Neoplasm of uncertain behavior of skin: Secondary | ICD-10-CM | POA: Diagnosis not present

## 2017-11-18 DIAGNOSIS — C44519 Basal cell carcinoma of skin of other part of trunk: Secondary | ICD-10-CM | POA: Diagnosis not present

## 2017-12-25 DIAGNOSIS — H7201 Central perforation of tympanic membrane, right ear: Secondary | ICD-10-CM | POA: Diagnosis not present

## 2017-12-25 DIAGNOSIS — H903 Sensorineural hearing loss, bilateral: Secondary | ICD-10-CM | POA: Diagnosis not present

## 2017-12-25 DIAGNOSIS — H6042 Cholesteatoma of left external ear: Secondary | ICD-10-CM | POA: Diagnosis not present

## 2017-12-25 DIAGNOSIS — H6123 Impacted cerumen, bilateral: Secondary | ICD-10-CM | POA: Diagnosis not present

## 2017-12-25 DIAGNOSIS — H6983 Other specified disorders of Eustachian tube, bilateral: Secondary | ICD-10-CM | POA: Diagnosis not present

## 2017-12-31 DIAGNOSIS — C44519 Basal cell carcinoma of skin of other part of trunk: Secondary | ICD-10-CM | POA: Diagnosis not present

## 2018-03-20 DIAGNOSIS — N390 Urinary tract infection, site not specified: Secondary | ICD-10-CM | POA: Diagnosis not present

## 2018-03-20 DIAGNOSIS — S81801A Unspecified open wound, right lower leg, initial encounter: Secondary | ICD-10-CM | POA: Diagnosis not present

## 2018-03-20 DIAGNOSIS — W19XXXA Unspecified fall, initial encounter: Secondary | ICD-10-CM | POA: Diagnosis not present

## 2018-03-20 DIAGNOSIS — R8290 Unspecified abnormal findings in urine: Secondary | ICD-10-CM | POA: Diagnosis not present

## 2018-03-26 DIAGNOSIS — S81801A Unspecified open wound, right lower leg, initial encounter: Secondary | ICD-10-CM | POA: Diagnosis not present

## 2018-03-30 DIAGNOSIS — R278 Other lack of coordination: Secondary | ICD-10-CM | POA: Diagnosis not present

## 2018-03-30 DIAGNOSIS — R262 Difficulty in walking, not elsewhere classified: Secondary | ICD-10-CM | POA: Diagnosis not present

## 2018-03-30 DIAGNOSIS — R2689 Other abnormalities of gait and mobility: Secondary | ICD-10-CM | POA: Diagnosis not present

## 2018-03-30 DIAGNOSIS — M6281 Muscle weakness (generalized): Secondary | ICD-10-CM | POA: Diagnosis not present

## 2018-04-01 DIAGNOSIS — R278 Other lack of coordination: Secondary | ICD-10-CM | POA: Diagnosis not present

## 2018-04-01 DIAGNOSIS — R262 Difficulty in walking, not elsewhere classified: Secondary | ICD-10-CM | POA: Diagnosis not present

## 2018-04-01 DIAGNOSIS — R2689 Other abnormalities of gait and mobility: Secondary | ICD-10-CM | POA: Diagnosis not present

## 2018-04-01 DIAGNOSIS — M6281 Muscle weakness (generalized): Secondary | ICD-10-CM | POA: Diagnosis not present

## 2018-04-03 DIAGNOSIS — M6281 Muscle weakness (generalized): Secondary | ICD-10-CM | POA: Diagnosis not present

## 2018-04-03 DIAGNOSIS — R2689 Other abnormalities of gait and mobility: Secondary | ICD-10-CM | POA: Diagnosis not present

## 2018-04-03 DIAGNOSIS — R278 Other lack of coordination: Secondary | ICD-10-CM | POA: Diagnosis not present

## 2018-04-03 DIAGNOSIS — R262 Difficulty in walking, not elsewhere classified: Secondary | ICD-10-CM | POA: Diagnosis not present

## 2018-04-07 DIAGNOSIS — M6281 Muscle weakness (generalized): Secondary | ICD-10-CM | POA: Diagnosis not present

## 2018-04-07 DIAGNOSIS — R278 Other lack of coordination: Secondary | ICD-10-CM | POA: Diagnosis not present

## 2018-04-07 DIAGNOSIS — R262 Difficulty in walking, not elsewhere classified: Secondary | ICD-10-CM | POA: Diagnosis not present

## 2018-04-07 DIAGNOSIS — R2689 Other abnormalities of gait and mobility: Secondary | ICD-10-CM | POA: Diagnosis not present

## 2018-04-09 DIAGNOSIS — R278 Other lack of coordination: Secondary | ICD-10-CM | POA: Diagnosis not present

## 2018-04-09 DIAGNOSIS — R2689 Other abnormalities of gait and mobility: Secondary | ICD-10-CM | POA: Diagnosis not present

## 2018-04-09 DIAGNOSIS — M6281 Muscle weakness (generalized): Secondary | ICD-10-CM | POA: Diagnosis not present

## 2018-04-09 DIAGNOSIS — R262 Difficulty in walking, not elsewhere classified: Secondary | ICD-10-CM | POA: Diagnosis not present

## 2018-04-10 DIAGNOSIS — R2689 Other abnormalities of gait and mobility: Secondary | ICD-10-CM | POA: Diagnosis not present

## 2018-04-10 DIAGNOSIS — R262 Difficulty in walking, not elsewhere classified: Secondary | ICD-10-CM | POA: Diagnosis not present

## 2018-04-10 DIAGNOSIS — R278 Other lack of coordination: Secondary | ICD-10-CM | POA: Diagnosis not present

## 2018-04-10 DIAGNOSIS — M6281 Muscle weakness (generalized): Secondary | ICD-10-CM | POA: Diagnosis not present

## 2018-04-14 DIAGNOSIS — R2689 Other abnormalities of gait and mobility: Secondary | ICD-10-CM | POA: Diagnosis not present

## 2018-04-14 DIAGNOSIS — R262 Difficulty in walking, not elsewhere classified: Secondary | ICD-10-CM | POA: Diagnosis not present

## 2018-04-14 DIAGNOSIS — M6281 Muscle weakness (generalized): Secondary | ICD-10-CM | POA: Diagnosis not present

## 2018-04-14 DIAGNOSIS — R278 Other lack of coordination: Secondary | ICD-10-CM | POA: Diagnosis not present

## 2018-04-15 DIAGNOSIS — R262 Difficulty in walking, not elsewhere classified: Secondary | ICD-10-CM | POA: Diagnosis not present

## 2018-04-15 DIAGNOSIS — R278 Other lack of coordination: Secondary | ICD-10-CM | POA: Diagnosis not present

## 2018-04-15 DIAGNOSIS — M6281 Muscle weakness (generalized): Secondary | ICD-10-CM | POA: Diagnosis not present

## 2018-04-15 DIAGNOSIS — R2689 Other abnormalities of gait and mobility: Secondary | ICD-10-CM | POA: Diagnosis not present

## 2018-04-17 DIAGNOSIS — R2689 Other abnormalities of gait and mobility: Secondary | ICD-10-CM | POA: Diagnosis not present

## 2018-04-17 DIAGNOSIS — M6281 Muscle weakness (generalized): Secondary | ICD-10-CM | POA: Diagnosis not present

## 2018-04-17 DIAGNOSIS — R262 Difficulty in walking, not elsewhere classified: Secondary | ICD-10-CM | POA: Diagnosis not present

## 2018-04-17 DIAGNOSIS — R278 Other lack of coordination: Secondary | ICD-10-CM | POA: Diagnosis not present

## 2018-04-20 DIAGNOSIS — R262 Difficulty in walking, not elsewhere classified: Secondary | ICD-10-CM | POA: Diagnosis not present

## 2018-04-20 DIAGNOSIS — R278 Other lack of coordination: Secondary | ICD-10-CM | POA: Diagnosis not present

## 2018-04-20 DIAGNOSIS — M6281 Muscle weakness (generalized): Secondary | ICD-10-CM | POA: Diagnosis not present

## 2018-04-20 DIAGNOSIS — R2689 Other abnormalities of gait and mobility: Secondary | ICD-10-CM | POA: Diagnosis not present

## 2018-04-21 DIAGNOSIS — R278 Other lack of coordination: Secondary | ICD-10-CM | POA: Diagnosis not present

## 2018-04-21 DIAGNOSIS — M6281 Muscle weakness (generalized): Secondary | ICD-10-CM | POA: Diagnosis not present

## 2018-04-21 DIAGNOSIS — R262 Difficulty in walking, not elsewhere classified: Secondary | ICD-10-CM | POA: Diagnosis not present

## 2018-04-21 DIAGNOSIS — R2689 Other abnormalities of gait and mobility: Secondary | ICD-10-CM | POA: Diagnosis not present

## 2018-04-24 DIAGNOSIS — R262 Difficulty in walking, not elsewhere classified: Secondary | ICD-10-CM | POA: Diagnosis not present

## 2018-04-24 DIAGNOSIS — R278 Other lack of coordination: Secondary | ICD-10-CM | POA: Diagnosis not present

## 2018-04-24 DIAGNOSIS — R2689 Other abnormalities of gait and mobility: Secondary | ICD-10-CM | POA: Diagnosis not present

## 2018-04-24 DIAGNOSIS — M6281 Muscle weakness (generalized): Secondary | ICD-10-CM | POA: Diagnosis not present

## 2018-04-28 DIAGNOSIS — M6281 Muscle weakness (generalized): Secondary | ICD-10-CM | POA: Diagnosis not present

## 2018-04-28 DIAGNOSIS — R278 Other lack of coordination: Secondary | ICD-10-CM | POA: Diagnosis not present

## 2018-04-28 DIAGNOSIS — R2689 Other abnormalities of gait and mobility: Secondary | ICD-10-CM | POA: Diagnosis not present

## 2018-04-28 DIAGNOSIS — R262 Difficulty in walking, not elsewhere classified: Secondary | ICD-10-CM | POA: Diagnosis not present

## 2018-04-29 DIAGNOSIS — R2689 Other abnormalities of gait and mobility: Secondary | ICD-10-CM | POA: Diagnosis not present

## 2018-04-29 DIAGNOSIS — M6281 Muscle weakness (generalized): Secondary | ICD-10-CM | POA: Diagnosis not present

## 2018-04-29 DIAGNOSIS — R262 Difficulty in walking, not elsewhere classified: Secondary | ICD-10-CM | POA: Diagnosis not present

## 2018-04-29 DIAGNOSIS — R278 Other lack of coordination: Secondary | ICD-10-CM | POA: Diagnosis not present

## 2018-05-01 DIAGNOSIS — R278 Other lack of coordination: Secondary | ICD-10-CM | POA: Diagnosis not present

## 2018-05-01 DIAGNOSIS — M6281 Muscle weakness (generalized): Secondary | ICD-10-CM | POA: Diagnosis not present

## 2018-05-01 DIAGNOSIS — R262 Difficulty in walking, not elsewhere classified: Secondary | ICD-10-CM | POA: Diagnosis not present

## 2018-05-01 DIAGNOSIS — R2689 Other abnormalities of gait and mobility: Secondary | ICD-10-CM | POA: Diagnosis not present

## 2018-05-05 DIAGNOSIS — M6281 Muscle weakness (generalized): Secondary | ICD-10-CM | POA: Diagnosis not present

## 2018-05-05 DIAGNOSIS — R262 Difficulty in walking, not elsewhere classified: Secondary | ICD-10-CM | POA: Diagnosis not present

## 2018-05-05 DIAGNOSIS — R2689 Other abnormalities of gait and mobility: Secondary | ICD-10-CM | POA: Diagnosis not present

## 2018-05-05 DIAGNOSIS — R278 Other lack of coordination: Secondary | ICD-10-CM | POA: Diagnosis not present

## 2018-05-08 DIAGNOSIS — M6281 Muscle weakness (generalized): Secondary | ICD-10-CM | POA: Diagnosis not present

## 2018-05-08 DIAGNOSIS — R262 Difficulty in walking, not elsewhere classified: Secondary | ICD-10-CM | POA: Diagnosis not present

## 2018-05-08 DIAGNOSIS — R278 Other lack of coordination: Secondary | ICD-10-CM | POA: Diagnosis not present

## 2018-05-08 DIAGNOSIS — R2689 Other abnormalities of gait and mobility: Secondary | ICD-10-CM | POA: Diagnosis not present

## 2018-05-12 DIAGNOSIS — R2689 Other abnormalities of gait and mobility: Secondary | ICD-10-CM | POA: Diagnosis not present

## 2018-05-12 DIAGNOSIS — R278 Other lack of coordination: Secondary | ICD-10-CM | POA: Diagnosis not present

## 2018-05-12 DIAGNOSIS — R262 Difficulty in walking, not elsewhere classified: Secondary | ICD-10-CM | POA: Diagnosis not present

## 2018-05-12 DIAGNOSIS — M6281 Muscle weakness (generalized): Secondary | ICD-10-CM | POA: Diagnosis not present

## 2018-05-14 DIAGNOSIS — R278 Other lack of coordination: Secondary | ICD-10-CM | POA: Diagnosis not present

## 2018-05-14 DIAGNOSIS — R262 Difficulty in walking, not elsewhere classified: Secondary | ICD-10-CM | POA: Diagnosis not present

## 2018-05-14 DIAGNOSIS — M6281 Muscle weakness (generalized): Secondary | ICD-10-CM | POA: Diagnosis not present

## 2018-05-14 DIAGNOSIS — R2689 Other abnormalities of gait and mobility: Secondary | ICD-10-CM | POA: Diagnosis not present

## 2018-05-18 DIAGNOSIS — R350 Frequency of micturition: Secondary | ICD-10-CM | POA: Diagnosis not present

## 2018-05-20 DIAGNOSIS — R2689 Other abnormalities of gait and mobility: Secondary | ICD-10-CM | POA: Diagnosis not present

## 2018-05-20 DIAGNOSIS — M6281 Muscle weakness (generalized): Secondary | ICD-10-CM | POA: Diagnosis not present

## 2018-05-20 DIAGNOSIS — R278 Other lack of coordination: Secondary | ICD-10-CM | POA: Diagnosis not present

## 2018-05-20 DIAGNOSIS — R262 Difficulty in walking, not elsewhere classified: Secondary | ICD-10-CM | POA: Diagnosis not present

## 2018-05-21 DIAGNOSIS — Z961 Presence of intraocular lens: Secondary | ICD-10-CM | POA: Diagnosis not present

## 2018-05-21 DIAGNOSIS — H26493 Other secondary cataract, bilateral: Secondary | ICD-10-CM | POA: Diagnosis not present

## 2018-06-29 DIAGNOSIS — M1990 Unspecified osteoarthritis, unspecified site: Secondary | ICD-10-CM | POA: Diagnosis not present

## 2018-06-29 DIAGNOSIS — I10 Essential (primary) hypertension: Secondary | ICD-10-CM | POA: Diagnosis not present

## 2018-06-29 DIAGNOSIS — R54 Age-related physical debility: Secondary | ICD-10-CM | POA: Diagnosis not present

## 2018-09-08 DIAGNOSIS — H6983 Other specified disorders of Eustachian tube, bilateral: Secondary | ICD-10-CM | POA: Diagnosis not present

## 2018-09-08 DIAGNOSIS — H7203 Central perforation of tympanic membrane, bilateral: Secondary | ICD-10-CM | POA: Diagnosis not present

## 2018-09-08 DIAGNOSIS — H903 Sensorineural hearing loss, bilateral: Secondary | ICD-10-CM | POA: Diagnosis not present

## 2018-09-08 DIAGNOSIS — H6123 Impacted cerumen, bilateral: Secondary | ICD-10-CM | POA: Diagnosis not present

## 2018-11-24 DIAGNOSIS — D485 Neoplasm of uncertain behavior of skin: Secondary | ICD-10-CM | POA: Diagnosis not present

## 2018-11-24 DIAGNOSIS — Z23 Encounter for immunization: Secondary | ICD-10-CM | POA: Diagnosis not present

## 2018-11-24 DIAGNOSIS — L570 Actinic keratosis: Secondary | ICD-10-CM | POA: Diagnosis not present

## 2018-11-24 DIAGNOSIS — L821 Other seborrheic keratosis: Secondary | ICD-10-CM | POA: Diagnosis not present

## 2018-11-24 DIAGNOSIS — C4441 Basal cell carcinoma of skin of scalp and neck: Secondary | ICD-10-CM | POA: Diagnosis not present

## 2018-12-14 DIAGNOSIS — H903 Sensorineural hearing loss, bilateral: Secondary | ICD-10-CM | POA: Diagnosis not present

## 2018-12-14 DIAGNOSIS — H6983 Other specified disorders of Eustachian tube, bilateral: Secondary | ICD-10-CM | POA: Diagnosis not present

## 2018-12-14 DIAGNOSIS — H6123 Impacted cerumen, bilateral: Secondary | ICD-10-CM | POA: Diagnosis not present

## 2018-12-14 DIAGNOSIS — H7203 Central perforation of tympanic membrane, bilateral: Secondary | ICD-10-CM | POA: Diagnosis not present

## 2019-01-02 ENCOUNTER — Observation Stay (HOSPITAL_COMMUNITY)
Admission: EM | Admit: 2019-01-02 | Discharge: 2019-01-04 | Disposition: A | Discharge status: Skilled Nursing Facility | Payer: Medicare Other | Attending: Internal Medicine | Admitting: Internal Medicine

## 2019-01-02 ENCOUNTER — Other Ambulatory Visit: Payer: Self-pay

## 2019-01-02 ENCOUNTER — Emergency Department (HOSPITAL_COMMUNITY): Payer: Medicare Other

## 2019-01-02 ENCOUNTER — Observation Stay (HOSPITAL_COMMUNITY): Payer: Medicare Other

## 2019-01-02 DIAGNOSIS — W19XXXA Unspecified fall, initial encounter: Secondary | ICD-10-CM | POA: Insufficient documentation

## 2019-01-02 DIAGNOSIS — S32502A Unspecified fracture of left pubis, initial encounter for closed fracture: Principal | ICD-10-CM | POA: Insufficient documentation

## 2019-01-02 DIAGNOSIS — D72829 Elevated white blood cell count, unspecified: Secondary | ICD-10-CM

## 2019-01-02 DIAGNOSIS — S329XXA Fracture of unspecified parts of lumbosacral spine and pelvis, initial encounter for closed fracture: Secondary | ICD-10-CM

## 2019-01-02 DIAGNOSIS — R269 Unspecified abnormalities of gait and mobility: Secondary | ICD-10-CM | POA: Diagnosis not present

## 2019-01-02 DIAGNOSIS — Z880 Allergy status to penicillin: Secondary | ICD-10-CM | POA: Insufficient documentation

## 2019-01-02 DIAGNOSIS — R296 Repeated falls: Secondary | ICD-10-CM | POA: Diagnosis not present

## 2019-01-02 DIAGNOSIS — I10 Essential (primary) hypertension: Secondary | ICD-10-CM | POA: Diagnosis not present

## 2019-01-02 DIAGNOSIS — R0902 Hypoxemia: Secondary | ICD-10-CM

## 2019-01-02 DIAGNOSIS — D649 Anemia, unspecified: Secondary | ICD-10-CM | POA: Diagnosis not present

## 2019-01-02 DIAGNOSIS — F329 Major depressive disorder, single episode, unspecified: Secondary | ICD-10-CM | POA: Diagnosis not present

## 2019-01-02 DIAGNOSIS — Z79899 Other long term (current) drug therapy: Secondary | ICD-10-CM | POA: Diagnosis not present

## 2019-01-02 DIAGNOSIS — R2681 Unsteadiness on feet: Secondary | ICD-10-CM | POA: Diagnosis not present

## 2019-01-02 DIAGNOSIS — M25559 Pain in unspecified hip: Secondary | ICD-10-CM | POA: Diagnosis present

## 2019-01-02 DIAGNOSIS — R52 Pain, unspecified: Secondary | ICD-10-CM | POA: Diagnosis not present

## 2019-01-02 DIAGNOSIS — Z882 Allergy status to sulfonamides status: Secondary | ICD-10-CM | POA: Diagnosis not present

## 2019-01-02 DIAGNOSIS — Z9181 History of falling: Secondary | ICD-10-CM | POA: Insufficient documentation

## 2019-01-02 DIAGNOSIS — Z881 Allergy status to other antibiotic agents status: Secondary | ICD-10-CM | POA: Insufficient documentation

## 2019-01-02 DIAGNOSIS — M25552 Pain in left hip: Secondary | ICD-10-CM | POA: Diagnosis not present

## 2019-01-02 DIAGNOSIS — S32592A Other specified fracture of left pubis, initial encounter for closed fracture: Secondary | ICD-10-CM | POA: Diagnosis not present

## 2019-01-02 DIAGNOSIS — N390 Urinary tract infection, site not specified: Secondary | ICD-10-CM | POA: Diagnosis not present

## 2019-01-02 DIAGNOSIS — R918 Other nonspecific abnormal finding of lung field: Secondary | ICD-10-CM | POA: Diagnosis not present

## 2019-01-02 DIAGNOSIS — Z96641 Presence of right artificial hip joint: Secondary | ICD-10-CM | POA: Insufficient documentation

## 2019-01-02 DIAGNOSIS — M810 Age-related osteoporosis without current pathological fracture: Secondary | ICD-10-CM | POA: Insufficient documentation

## 2019-01-02 DIAGNOSIS — Z96643 Presence of artificial hip joint, bilateral: Secondary | ICD-10-CM | POA: Diagnosis not present

## 2019-01-02 DIAGNOSIS — S79912A Unspecified injury of left hip, initial encounter: Secondary | ICD-10-CM | POA: Diagnosis not present

## 2019-01-02 DIAGNOSIS — S32599A Other specified fracture of unspecified pubis, initial encounter for closed fracture: Secondary | ICD-10-CM | POA: Diagnosis present

## 2019-01-02 HISTORY — DX: Fracture of unspecified parts of lumbosacral spine and pelvis, initial encounter for closed fracture: S32.9XXA

## 2019-01-02 LAB — COMPREHENSIVE METABOLIC PANEL
ALK PHOS: 62 U/L (ref 38–126)
ALT: 17 U/L (ref 0–44)
AST: 22 U/L (ref 15–41)
Albumin: 3.6 g/dL (ref 3.5–5.0)
Anion gap: 7 (ref 5–15)
BUN: 19 mg/dL (ref 8–23)
CALCIUM: 9.3 mg/dL (ref 8.9–10.3)
CHLORIDE: 107 mmol/L (ref 98–111)
CO2: 23 mmol/L (ref 22–32)
CREATININE: 0.95 mg/dL (ref 0.44–1.00)
GFR calc Af Amer: 58 mL/min — ABNORMAL LOW (ref >60)
GFR calc non Af Amer: 50 mL/min — ABNORMAL LOW (ref >60)
Glucose, Bld: 116 mg/dL — ABNORMAL HIGH (ref 70–99)
Potassium: 3.8 mmol/L (ref 3.5–5.1)
Sodium: 137 mmol/L (ref 135–145)
Total Bilirubin: 0.7 mg/dL (ref 0.3–1.2)
Total Protein: 6.8 g/dL (ref 6.5–8.1)

## 2019-01-02 LAB — URINALYSIS, ROUTINE W REFLEX MICROSCOPIC
BILIRUBIN URINE: NEGATIVE
Bacteria, UA: NONE SEEN
Glucose, UA: NEGATIVE mg/dL
HGB URINE DIPSTICK: NEGATIVE
KETONES UR: NEGATIVE mg/dL
NITRITE: NEGATIVE
PH: 8 (ref 5.0–8.0)
PROTEIN: NEGATIVE mg/dL
Specific Gravity, Urine: 1.008 (ref 1.005–1.030)
WBC, UA: >50 WBC/hpf — ABNORMAL HIGH (ref 0–5)

## 2019-01-02 LAB — CBC WITH DIFFERENTIAL/PLATELET
ABS IMMATURE GRANULOCYTES: 0.13 10*3/uL — AB (ref 0.00–0.07)
BASOS PCT: 0 %
Basophils Absolute: 0 10*3/uL (ref 0.0–0.1)
EOS PCT: 0 %
Eosinophils Absolute: 0 10*3/uL (ref 0.0–0.5)
HCT: 41.5 % (ref 36.0–46.0)
HEMOGLOBIN: 12.7 g/dL (ref 12.0–15.0)
IMMATURE GRANULOCYTES: 1 %
LYMPHS ABS: 1.8 10*3/uL (ref 0.7–4.0)
Lymphocytes Relative: 10 %
MCH: 30.2 pg (ref 26.0–34.0)
MCHC: 30.6 g/dL (ref 30.0–36.0)
MCV: 98.8 fL (ref 80.0–100.0)
MONO ABS: 0.8 10*3/uL (ref 0.1–1.0)
Monocytes Relative: 5 %
Neutro Abs: 14.6 10*3/uL — ABNORMAL HIGH (ref 1.7–7.7)
Neutrophils Relative %: 84 %
PLATELETS: 305 10*3/uL (ref 150–400)
RBC: 4.2 MIL/uL (ref 3.87–5.11)
RDW: 13.2 % (ref 11.5–15.5)
WBC: 17.4 10*3/uL — ABNORMAL HIGH (ref 4.0–10.5)
nRBC: 0 % (ref 0.0–0.2)

## 2019-01-02 LAB — ALBUMIN: ALBUMIN: 3.6 g/dL (ref 3.5–5.0)

## 2019-01-02 MED ORDER — METHOCARBAMOL 1000 MG/10ML IJ SOLN
500.0000 mg | Freq: Four times a day (QID) | INTRAVENOUS | Status: DC | PRN
  Filled 2019-01-02: qty 5

## 2019-01-02 MED ORDER — METHOCARBAMOL 500 MG PO TABS: 500.0000 mg | ORAL_TABLET | Freq: Four times a day (QID) | ORAL | Status: DC | PRN

## 2019-01-02 MED ORDER — SENNA 8.6 MG PO TABS
1.0000 | ORAL_TABLET | Freq: Two times a day (BID) | ORAL | Status: DC
  Administered 2019-01-03 – 2019-01-04 (×3): 8.6 mg via ORAL
  Filled 2019-01-02 (×3): qty 1

## 2019-01-02 MED ORDER — AMLODIPINE BESYLATE 2.5 MG PO TABS
2.5000 mg | ORAL_TABLET | Freq: Every day | ORAL | Status: DC
  Administered 2019-01-03 – 2019-01-04 (×2): 2.5 mg via ORAL
  Filled 2019-01-02 (×2): qty 1

## 2019-01-02 MED ORDER — HYDROCODONE-ACETAMINOPHEN 5-325 MG PO TABS: 1.0000 | ORAL_TABLET | Freq: Four times a day (QID) | ORAL | Status: DC | PRN

## 2019-01-02 NOTE — ED Notes (Addendum)
Pt refusing saline lock IV line. States she can tolerate PO meds and fluids. Does not want IV. Dr. Toy Baker made aware.

## 2019-01-02 NOTE — H&P (Addendum)
[name] PJK:[id number] DOB: 15-May-1919 DOA: 01/02/2019     PCP: Gaynelle Arabian, MD   Outpatient Specialists:     Orthopedics Dr. Garnette Czech Patient arrived to ER on 01/02/19 at 1823  Patient coming from:   From facility [facility] living  Chief Complaint:  Chief Complaint  Patient presents with  . Fall    HPI: [name] is a 85 y.o. female with medical history significant of  HTN, frequent falls, frequent UTI's,  small subdural hematoma in 2018, bilateral hip replacemetns    Presented with patient had a mechanical fall today and since then have had trouble ambulating. She walks regularly with a walker.  Family thinks she could have fallen twice.  She is has history of frequent falls especially when she has a UTI she denies currently dysuria no fevers or chills no cough no shortness of breath no travel history.  She has severe pain in her pelvis with ambulation at her baseline she walks with a walker but has not been able to ambulate since the fall Was no associated chest pain abdominal pain no headaches no shortness of breath. Patient is a family friend of Dr. Donne Hazel who felt that she would benefit from further evaluation patient was brought in to [facility] emergency department.  Regarding pertinent Chronic problems:  Hx Of hypertension for which she takes amlodipine  While in ER: Plain film showing evidence of pelvic fracture superior ramus ER provider spoke with Dr. Garnette Czech with orthopedics Commence admission for PT OT evaluation and pain management She did noticed to have some leukocytosis on arrival which thought to be secondary to stress demargination. The following Work up has been ordered so far:  Orders Placed This Encounter  Procedures  . Urine culture  . DG Hip Unilat W or Wo Pelvis 2-3 Views Left  . Urinalysis, Routine w reflex microscopic  . CBC with Differential  . Comprehensive metabolic panel  . Consult to hospitalist      Following Medications were ordered in ER: Medications - No data to display      Consult Orders  (From admission, onward)         Start     Ordered   01/02/19 2018  Consult to hospitalist  Once    Provider:  (Not yet assigned)  Question Answer Comment  Place call to: [facility]   Reason for Consult Admit      01/02/19 2017          ER Provider Called: Orthopedics   Dr Garnette Czech They Recommend admit to medicine for pain management and OT PT evaluation Nonoperative management   Significant initial  Findings: Abnormal Labs Reviewed  CBC WITH DIFFERENTIAL/PLATELET - Abnormal; Notable for the following components:      Result Value   WBC 17.4 (*)    Neutro Abs 14.6 (*)    Abs Immature Granulocytes 0.13 (*)    All other components within normal limits  COMPREHENSIVE METABOLIC PANEL - Abnormal; Notable for the following components:   Glucose, Bld 116 (*)    GFR calc non Af Amer 50 (*)    GFR calc Af Amer 58 (*)    All other components within normal limits   Otherwise labs showing:    Recent Labs  Lab 01/02/19 1934  NA 137  K 3.8  CO2 23  GLUCOSE 116*  BUN 19  CREATININE 0.95  CALCIUM 9.3    Cr   stable,    Lab  Results  Component Value Date   CREATININE 0.95 01/02/2019   CREATININE 0.83 07/09/2017   CREATININE 0.81 07/08/2017    Recent Labs  Lab 01/02/19 1934  AST 22  ALT 17  ALKPHOS 62  BILITOT 0.7  PROT 6.8  ALBUMIN 3.6       WBC       Component Value Date/Time   WBC 17.4 (H) 01/02/2019 1934      HG/HCT  stable,      Component Value Date/Time   HGB 12.7 01/02/2019 1934   HCT 41.5 01/02/2019 1934       UA  ordered   CT HEAD * NON acute  CXR - bibasilar atelectasis   Pelvis film - left superior pubic ramus irregularity   ECG: not obtained     ED Triage Vitals  Enc Vitals Group     BP 01/02/19 1838 (!) 185/75     Pulse Rate 01/02/19 1838 78     Resp 01/02/19 1838 16     Temp 01/02/19 1838 98 F (36.7 C)      Temp Source 01/02/19 1838 Oral     SpO2 01/02/19 1838 94 %     Weight 01/02/19 1835 98 lb 15.8 oz (44.9 kg)     Height 01/02/19 1835 5' (1.524 m)     Head Circumference --      Peak Flow --      Pain Score 01/02/19 1835 0     Pain Loc --      Pain Edu? --      Excl. in [place]? --   TMAX(24)@       Latest  Blood pressure (!) 157/69, pulse 81, temperature 98 F (36.7 C), temperature source Oral, resp. rate 16, height 5' (1.524 m), weight 44.9 kg, SpO2 (!) 89 %.    Hospitalist was called for admission for Left Pubic ramus fracture   Review of Systems:    Pertinent positives include: fall  Constitutional:  No weight loss, night sweats, Fevers, chills, fatigue, weight loss  HEENT:  No headaches, Difficulty swallowing,Tooth/dental problems,Sore throat,  No sneezing, itching, ear ache, nasal congestion, post nasal drip,  Cardio-vascular:  No chest pain, Orthopnea, PND, anasarca, dizziness, palpitations.no Bilateral lower extremity swelling  GI:  No heartburn, indigestion, abdominal pain, nausea, vomiting, diarrhea, change in bowel habits, loss of appetite, melena, blood in stool, hematemesis Resp:  no shortness of breath at rest. No dyspnea on exertion, No excess mucus, no productive cough, No non-productive cough, No coughing up of blood.No change in color of mucus.No wheezing. Skin:  no rash or lesions. No jaundice GU:  no dysuria, change in color of urine, no urgency or frequency. No straining to urinate.  No flank pain.  Musculoskeletal:  No joint pain or no joint swelling. No decreased range of motion. No back pain.  Psych:  No change in mood or affect. No depression or anxiety. No memory loss.  Neuro: no localizing neurological complaints, no tingling, no weakness, no double vision, no gait abnormality, no slurred speech, no confusion  All systems reviewed and apart from [place] all are negative  Past Medical History:   Past Medical History:  Diagnosis Date  . Cancer of the  skin, basal cell 2008   FACE  . Degenerative joint disease (DJD) of hip   . Depression   . Gait disorder   . Hypertension   . Hypokalemia   . Iron deficiency anemia   . Osteoporosis  Past Surgical History:  Procedure Laterality Date  . CATARACT EXTRACTION, BILATERAL    . HIP FRACTURE SURGERY  09/2004   DR. WHITFIELD  . NERVE ENTRAPMENT     LEFT THUMB  . SPINAL FUSION  1996   L3-4 [facility]  . WRIST FRACTURE SURGERY     DR. Daylene Katayama    Social History:  Ambulatory   walker  For the past 15 year due to neurological disorder     reports that she has never smoked. She has never used smokeless tobacco. She reports that she does not drink alcohol or use drugs.     Family History:   Family History  Problem Relation Age of Onset  . Heart failure Mother   . Hypertension Father   . Diabetes Sister     Allergies: Allergies  Allergen Reactions  . Dicloxacillin     UNKNOWN  . Pork-Derived Products     Pt does not eat pork  . Septra [Sulfamethoxazole-Trimethoprim]     N/V     Prior to Admission medications   Medication Sig Start Date End Date Taking? Authorizing Provider  acetaminophen (TYLENOL) 650 MG CR tablet Take 650 mg by mouth every 8 (eight) hours as needed for pain.    [provider]  amLODipine (NORVASC) 2.5 MG tablet Take 2.5 mg by mouth daily.    [provider]  Calcium Carbonate-Vitamin D (CALTRATE 600+D) 600-400 MG-UNIT per tablet Take 1 tablet by mouth daily.    [provider]  levETIRAcetam (KEPPRA) 500 MG tablet Take 1 tablet (500 mg total) by mouth 2 (two) times daily. 07/09/17 07/16/17  Mariel Aloe, MD  multivitamin-iron-minerals-folic acid (CENTRUM) chewable tablet Chew 1 tablet by mouth daily.    [provider]   Physical Exam: Blood pressure (!) 157/69, pulse 81, temperature 98 F (36.7 C), temperature source Oral, resp. rate 16, height 5' (1.524 m), weight 44.9 kg, SpO2 (!) 89 %. Up to 97%  with good pleth.  1. General:  in No Acute distress  Chronically ill-appearing 2. Psychological: Alert and Oriented 3. Head/ENT:   Dry Mucous Membranes                          Head Non traumatic, neck supple                           Poor Dentition 4. SKIN: decreased Skin turgor,  Skin clean Dry and intact no rash 5. Heart: Regular rate and rhythm no Murmur, no Rub or gallop 6. Lungs:  Clear to auscultation bilaterally, no wheezes or crackles   7. Abdomen: Soft, non-tender, Non distended  bowel sounds present 8. Lower extremities: no clubbing, cyanosis, no edema 9. Neurologically Grossly intact, moving all 4 extremities equally  10. MSK: Normal range of motion   All other LABS:     Recent Labs  Lab 01/02/19 1934  WBC 17.4*  NEUTROABS 14.6*  HGB 12.7  HCT 41.5  MCV 98.8  PLT 305     Recent Labs  Lab 01/02/19 1934  NA 137  K 3.8  CL 107  CO2 23  GLUCOSE 116*  BUN 19  CREATININE 0.95  CALCIUM 9.3     Recent Labs  Lab 01/02/19 1934  AST 22  ALT 17  ALKPHOS 62  BILITOT 0.7  PROT 6.8  ALBUMIN 3.6       Cultures:    Component  Value Date/Time   SDES URINE, CATHETERIZED 07/08/2017 1920   SPECREQUEST NONE 07/08/2017 1920   CULT >=100,000 COLONIES/mL ESCHERICHIA COLI (A) 07/08/2017 1920   REPTSTATUS 07/11/2017 FINAL 07/08/2017 1920     Radiological Exams on Admission: Dg Hip Unilat W Or Wo Pelvis 2-3 Views Left  Result Date: 01/02/2019 CLINICAL DATA:  85 year old with osteoporosis. Fall with left pelvic and left hip pain. EXAM: DG HIP (WITH OR WITHOUT PELVIS) 2-3V LEFT COMPARISON:  Pelvic radiograph 02/01/2014 FINDINGS: Slight cortical irregularity of the left superior pubic ramus, not definitively seen on prior exam. This area is not as well assessed on the frogleg lateral view. Lateral plate and trans trochanteric screw fixation of the left proximal femur. Right hip arthroplasty. Pubic symphysis and sacroiliac joints are congruent. Degenerative change of the  sacroiliac joints. Diffuse bony under mineralization. There are vascular calcifications. IMPRESSION: 1. Slight cortical irregularity of the left superior pubic ramus, suspicious for nondisplaced fracture. CT could be considered for further evaluation, however orthopedic hardware may cause significant streak artifact and limit assessment. 2. Intact right hip arthroplasty were visualized. Intact left hip ORIF hardware. Electronically Signed   By: Keith Rake M.D.   On: 01/02/2019 19:37    Chart has been reviewed    Assessment/Plan  85 y.o. female with medical history significant of  HTN, frequent falls, frequent UTI's,  small subdural hematoma in 2018, bilateral hip replacemetns Admitted for  Pubic ramus fracture   Present on Admission:  . Fall recurrent falls will have PT OT evaluate prior to discharge . Pubic ramus fracture (HCC) patient care was discussed with family who would like for her to be assessed by PT OT prior to discharge to an appropriate level of care facility as at her baseline she is ambulating with a walker and currently unable to ambulate.  Patient will likely need placement   - I was notified at a later time that patient refused IV or any IV medications she is comfortable at rest, will treat pain  with PO meds  . Hip pain -pain management PTOT  evaluation . Essential hypertension - stable continue Norvasc . Leukocytosis -in the setting of a fall and fracture likely stress demargination Low O@ reading seems to be due to poor reading CXR showing atelectasis in the room O2 sat up to 97% on RA with good wave form, no hx of cough or shortness of breath  Other plan as per orders.  DVT prophylaxis:  SCD for today  Code Status:    DNR/DNI as per patient    I had personally discussed CODE STATUS with  Family     Family Communication:   Family not  at  Bedside  plan of care was discussed with   Daughter, on the phone Disposition Plan:     likely will need placement for  rehabilitation                                             Would benefit from PT/OT eval prior to DC  Ordered                   Swallow eval - SLP ordered                   Social Work  consulted  Consults called: Orthopedics aware   Admission status:  ED Disposition    ED Disposition Condition [place]: [place] [[id number]]  Level of Care: Med-Surg [16]  I expect the patient will be discharged within 24 hours: No (not a candidate for 5C-Observation unit)  Diagnosis: Pubic ramus fracture ([place]) [559741]  Admitting Physician: Toy Baker [[id number]]  Attending Physician: Toy Baker [[id number]]  PT Class (Do Not Modify): Observation [104]  PT Acc Code (Do Not Modify): Observation [10022]       Obs    Level of care       medical floor    Precautions:  NONE     Annaliese Saez 01/02/2019, 9:54 PM    [facility]     after 2 AM please page floor coverage PA If 7AM-7PM, please contact the day team taking care of the patient using [email or web address]

## 2019-01-02 NOTE — ED Provider Notes (Signed)
[facility] EMERGENCY DEPARTMENT Provider Note   CSN: [id number] Arrival date & time: 01/02/19  1823    History   Chief Complaint Chief Complaint  Patient presents with  . Fall    HPI [name] is a 85 y.o. female.     The history is provided by the patient and medical records. No language interpreter was used.  Fall  This is a new problem. The current episode started 1 to 2 hours ago. The problem occurs constantly. The problem has not changed since onset.Pertinent negatives include no chest pain, no abdominal pain, no headaches and no shortness of breath. The symptoms are aggravated by standing and walking. The symptoms are relieved by lying down and rest. She has tried nothing for the symptoms. The treatment provided no relief.    Past Medical History:  Diagnosis Date  . Cancer of the skin, basal cell 2008   FACE  . Degenerative joint disease (DJD) of hip   . Depression   . Gait disorder   . Hypertension   . Hypokalemia   . Iron deficiency anemia   . Osteoporosis     Patient Active Problem List   Diagnosis Date Noted  . Hypokalemia 07/09/2017  . Subdural hematoma ([place]) 07/08/2017  . Frequent falls 07/08/2017  . Acute lower UTI 07/08/2017  . Acute metabolic encephalopathy 60/07/9322  . Fall     Past Surgical History:  Procedure Laterality Date  . CATARACT EXTRACTION, BILATERAL    . HIP FRACTURE SURGERY  09/2004   DR. WHITFIELD  . NERVE ENTRAPMENT     LEFT THUMB  . SPINAL FUSION  1996   L3-4 [facility]  . WRIST FRACTURE SURGERY     DR. Daylene Katayama     OB History   No obstetric history on file.      Home Medications    Prior to Admission medications   Medication Sig Start Date End Date Taking? Authorizing Provider  acetaminophen (TYLENOL) 650 MG CR tablet Take 650 mg by mouth every 8 (eight) hours as needed for pain.    [provider]  amLODipine (NORVASC) 2.5 MG tablet Take 2.5 mg by mouth daily.    [provider]  Calcium Carbonate-Vitamin D (CALTRATE 600+D) 600-400 MG-UNIT per tablet Take 1 tablet by mouth daily.    [provider]  levETIRAcetam (KEPPRA) 500 MG tablet Take 1 tablet (500 mg total) by mouth 2 (two) times daily. 07/09/17 07/16/17  Mariel Aloe, MD  multivitamin-iron-minerals-folic acid (CENTRUM) chewable tablet Chew 1 tablet by mouth daily.    [provider]    Family History Family History  Problem Relation Age of Onset  . Heart failure Mother   . Hypertension Father   . Diabetes Sister     Social History Social History   Tobacco Use  . Smoking status: Never Smoker  . Smokeless tobacco: Never Used  Substance Use Topics  . Alcohol use: No  . Drug use: No     Allergies   Dicloxacillin; Pork-derived products; and Septra [sulfamethoxazole-trimethoprim]   Review of Systems Review of Systems  Constitutional: Negative for chills, diaphoresis, fatigue and fever.  HENT: Negative for congestion and rhinorrhea.   Respiratory: Negative for cough, shortness of breath and wheezing.   Cardiovascular: Negative for chest pain.  Gastrointestinal: Negative for abdominal pain, constipation, diarrhea, nausea and vomiting.  Genitourinary: Positive for difficulty urinating. Negative for dysuria, flank pain, frequency and hematuria.  Musculoskeletal: Negative for back pain, neck  pain and neck stiffness.  Skin: Negative for rash and wound.  Neurological: Negative for dizziness, weakness, light-headedness and headaches.  Psychiatric/Behavioral: Negative for agitation.  All other systems reviewed and are negative.    Physical Exam Updated Vital Signs BP (!) 185/75   Pulse 78   Temp 98 F (36.7 C) (Oral)   Resp 16   Ht 5' (1.524 m)   Wt 44.9 kg   SpO2 94%   BMI 19.33 kg/m   Physical Exam Vitals signs and nursing note reviewed.  Constitutional:      General: She is not in acute distress.    Appearance: She is well-developed. She is not  ill-appearing, toxic-appearing or diaphoretic.  HENT:     Head: Normocephalic and atraumatic.     Nose: No congestion or rhinorrhea.     Mouth/Throat:     Pharynx: No oropharyngeal exudate.  Eyes:     Conjunctiva/sclera: Conjunctivae normal.     Pupils: Pupils are equal, round, and reactive to light.  Neck:     Musculoskeletal: Neck supple.  Cardiovascular:     Rate and Rhythm: Normal rate and regular rhythm.     Heart sounds: No murmur.  Pulmonary:     Effort: Pulmonary effort is normal. No respiratory distress.     Breath sounds: Normal breath sounds.  Abdominal:     Palpations: Abdomen is soft.     Tenderness: There is no abdominal tenderness.  Musculoskeletal:        General: Tenderness present.     Left hip: She exhibits tenderness. She exhibits normal range of motion and no laceration.     Right lower leg: No edema.     Left lower leg: No edema.       Legs:     Comments: No leg length difference or shortening.  Patient has pain with left hip manipulation and pain when palpating the left pelvis.  No skin changes seen.  Normal sensation and pulse distally.  No other abdominal tenderness.  Skin:    General: Skin is warm and dry.     Capillary Refill: Capillary refill takes less than 2 seconds.     Findings: No erythema.  Neurological:     General: No focal deficit present.     Mental Status: She is alert and oriented to person, place, and time.     Sensory: No sensory deficit.     Motor: No weakness.  Psychiatric:        Mood and Affect: Mood normal.      ED Treatments / Results  Labs (all labs ordered are listed, but only abnormal results are displayed) Labs Reviewed  URINALYSIS, ROUTINE W REFLEX MICROSCOPIC - Abnormal; Notable for the following components:      Result Value   Leukocytes,Ua LARGE (*)    WBC, UA >50 (*)    All other components within normal limits  CBC WITH DIFFERENTIAL/PLATELET - Abnormal; Notable for the following components:   WBC 17.4 (*)     Neutro Abs 14.6 (*)    Abs Immature Granulocytes 0.13 (*)    All other components within normal limits  COMPREHENSIVE METABOLIC PANEL - Abnormal; Notable for the following components:   Glucose, Bld 116 (*)    GFR calc non Af Amer 50 (*)    GFR calc Af Amer 58 (*)    All other components within normal limits  URINE CULTURE    EKG None  Radiology Dg Chest Port 1 View  Result Date:  01/02/2019 CLINICAL DATA:  Hypoxia. EXAM: PORTABLE CHEST 1 VIEW COMPARISON:  Chest radiograph and CT 09/16/2010 FINDINGS: Patient is rotated. Low lung volumes. Vague bibasilar opacities favoring atelectasis. Biapical pleuroparenchymal scarring as before. Heart is normal in size for technique. Atherosclerosis of the thoracic aorta. No pulmonary edema, confluent airspace disease, pleural effusion or pneumothorax. Remote bilateral rib fractures. IMPRESSION: 1. Low lung volumes with bibasilar opacities favoring atelectasis. 2.  Aortic Atherosclerosis (ICD10-I70.0). Electronically Signed   By: Keith Rake M.D.   On: 01/02/2019 21:37   Dg Hip Unilat W Or Wo Pelvis 2-3 Views Left  Result Date: 01/02/2019 CLINICAL DATA:  85 year old with osteoporosis. Fall with left pelvic and left hip pain. EXAM: DG HIP (WITH OR WITHOUT PELVIS) 2-3V LEFT COMPARISON:  Pelvic radiograph 02/01/2014 FINDINGS: Slight cortical irregularity of the left superior pubic ramus, not definitively seen on prior exam. This area is not as well assessed on the frogleg lateral view. Lateral plate and trans trochanteric screw fixation of the left proximal femur. Right hip arthroplasty. Pubic symphysis and sacroiliac joints are congruent. Degenerative change of the sacroiliac joints. Diffuse bony under mineralization. There are vascular calcifications. IMPRESSION: 1. Slight cortical irregularity of the left superior pubic ramus, suspicious for nondisplaced fracture. CT could be considered for further evaluation, however orthopedic hardware may cause  significant streak artifact and limit assessment. 2. Intact right hip arthroplasty were visualized. Intact left hip ORIF hardware. Electronically Signed   By: Keith Rake M.D.   On: 01/02/2019 19:37    Procedures Procedures (including critical care time)  Medications Ordered in ED Medications - No data to display   Initial Impression / Assessment and Plan / ED Course  I have reviewed the triage vital signs and the nursing notes.  Pertinent labs & imaging results that were available during my care of the patient were reviewed by me and considered in my medical decision making (see chart for details).        JAMERIA BRADWAY is a pleasant 85 y.o. female with a past medical history significant for hypertension, iron deficiency anemia, and osteoporosis who presents with fall and left pelvis/hip pain.  Patient brought in by EMS after a fall today.  Patient reports he fell outside on uneven ground.  Was mechanical fall and she had no preceding symptoms.  Family says the last time she fell she had a urinary tract infection and requested evaluation for this.  Patient reports pain in her left pelvis and her left hip and is having pain with standing or trying to walk.  She denies any headache, neck pain, neck stiffness, chest pain, abdominal pain, or back pain.  She reports the pain is severe when she tries to stand but is pain-free at rest.  On exam, patient normal sensation and strength in her legs.  Symmetric radial pulses and symmetric DP pulses.  Patient had pain with left hip manipulation however I did not see a leg length difference.  Some tenderness in the left groin/pelvis area.  Pelvis did not feel unstable.  No laceration seen.  Abdomen otherwise nontender, flanks nontender, back nontender.  Lungs clear, patient has no murmur.  Patient does report that she thinks she is having some pain with urination.  Patient will have laboratory testing and urinalysis to look for UTI and will have  x-ray of the left hip and pelvis to look for fracture dislocation.  If work-up is reassuring, anticipate assessment of gait and discharge however fracture is seen, anticipate admission.  8:17  PM X-ray shows concern for pelvic fracture in the superior ramus.  Spoke with Dr. Durward Fortes with orthopedics who is the patient's orthopedist (cell [phone])  He recommends admission to medicine for PT/OT as she is unable to stand without severe pain and she lives in an independent living facility.  As she is unable to stand, she is not safe to be discharged at this time.  Hospitalist will be called for PT/OT and further pain management.  Also note, patient has not yet urinated and family was concerned that patient may have a UTI as this is been related with her falls in the past.  Patient has had a leukocytosis on arrival.  Orthopedics felt that there is likely no injury to the urethra based on the location of the fracture.  Orthopedics did not feel that CT would be significant beneficial given her large amount of hardware in both hips.  Hospitalist will be called for admission for 85 year old with a pelvic fracture who cannot walk.  8:54 PM Patient was able to urinate and urinalysis will be sent.  No concern for retention at this time.  Kidney function normal.  Electrolytes reassuring.  Patient will be admitted for further management.  Hospitalist team will follow up on urinalysis to look for infection given her proclivity for falls in the setting of UTI.   9:50 PM Urinalysis shows leukocytes but no bacteria.  Culture was sent.  Hospitalist will admit.   Final Clinical Impressions(s) / ED Diagnoses   Final diagnoses:  Fall, initial encounter  Closed nondisplaced fracture of pelvis, unspecified part of pelvis, initial encounter [facility])    ED Discharge Orders    None     Clinical Impression: 1. Fall, initial encounter   2. Closed nondisplaced fracture of pelvis, unspecified part of pelvis,  initial encounter [facility])     Disposition: Admit  This note was prepared with assistance of Dragon voice recognition software. Occasional wrong-word or sound-a-like substitutions may have occurred due to the inherent limitations of voice recognition software.     Ludella Pranger, Gwenyth Allegra, MD 01/02/19 2150

## 2019-01-02 NOTE — Progress Notes (Signed)
Received a/o but very HOH female 85 y/o noted skin intact with exception of abrasion to left knee. O2 sat 90% on ra contineous monitor. No iv access pt refused. stress incontince noted when pt coughed- pt also had I small occurrence of n/v. Purewick placed

## 2019-01-02 NOTE — ED Notes (Addendum)
ED TO INPATIENT HANDOFF REPORT  ED Nurse Name and Phone #: Wilhelmenia Blase RN [phone]  S Name/Age/Gender [name] 85 y.o. female Room/Bed: 038C/038C  Code Status   Code Status: Prior  Home/SNF/Other Nursing Home Patient oriented to: self, place, time and situation Is this baseline? Yes   Triage Complete: Triage complete  Chief Complaint FAll,Hip Pain   Triage Note Pt BIB [facility] for a fall today that happened around 1600. Pt resides at [facility]. Pt reports left hip pain and difficulty walking since her fall. Per EMS her family reports that she sometimes falls when she has a UTI and they would like her checked for one. Pt is alert and oriented x4 at present. Pt denies any pain when lying down but reports pain with movement of left lower extremity.    Allergies Allergies  Allergen Reactions  . Dicloxacillin Other (See Comments)    Unknown severe allergic reaction  . Other Other (See Comments)    Unknown reaction to hazel nuts  . Pork-Derived Products Other (See Comments)    Pt does not eat pork  . Septra [Sulfamethoxazole-Trimethoprim] Nausea And Vomiting    Level of Care/Admitting Diagnosis ED Disposition    ED Disposition Condition Comment   Admit  Hospital Area: [facility] [[id number]]  Level of Care: Med-Surg [16]  I expect the patient will be discharged within 24 hours: No (not a candidate for 5C-Observation unit)  Diagnosis: Pubic ramus fracture ([place]) [944967]  Admitting Physician: Toy Baker [[id number]]  Attending Physician: Toy Baker [[id number]]  PT Class (Do Not Modify): Observation [104]  PT Acc Code (Do Not Modify): Observation [10022]       B Medical/Surgery History Past Medical History:  Diagnosis Date  . Cancer of the skin, basal cell 2008   FACE  . Degenerative joint disease (DJD) of hip   . Depression   . Gait disorder   . Hypertension   . Hypokalemia   . Iron deficiency anemia   . Osteoporosis    Past Surgical  History:  Procedure Laterality Date  . CATARACT EXTRACTION, BILATERAL    . HIP FRACTURE SURGERY  09/2004   DR. WHITFIELD  . NERVE ENTRAPMENT     LEFT THUMB  . SPINAL FUSION  1996   L3-4 [facility]  . WRIST FRACTURE SURGERY     DR. Daylene Katayama     A IV Location/Drains/Wounds Patient Lines/Drains/Airways Status   Active Line/Drains/Airways    None          Intake/Output Last 24 hours No intake or output data in the 24 hours ending 01/02/19 2122  Labs/Imaging Results for orders placed or performed during the hospital encounter of 01/02/19 (from the past 48 hour(s))  CBC with Differential     Status: Abnormal   Collection Time: 01/02/19  7:34 PM  Result Value Ref Range   WBC 17.4 (H) 4.0 - 10.5 K/uL   RBC 4.20 3.87 - 5.11 MIL/uL   Hemoglobin 12.7 12.0 - 15.0 g/dL   HCT 41.5 36.0 - 46.0 %   MCV 98.8 80.0 - 100.0 fL   MCH 30.2 26.0 - 34.0 pg   MCHC 30.6 30.0 - 36.0 g/dL   RDW 13.2 11.5 - 15.5 %   Platelets 305 150 - 400 K/uL   nRBC 0.0 0.0 - 0.2 %   Neutrophils Relative % 84 %   Neutro Abs 14.6 (H) 1.7 - 7.7 K/uL   Lymphocytes Relative 10 %   Lymphs Abs 1.8 0.7 -  4.0 K/uL   Monocytes Relative 5 %   Monocytes Absolute 0.8 0.1 - 1.0 K/uL   Eosinophils Relative 0 %   Eosinophils Absolute 0.0 0.0 - 0.5 K/uL   Basophils Relative 0 %   Basophils Absolute 0.0 0.0 - 0.1 K/uL   Immature Granulocytes 1 %   Abs Immature Granulocytes 0.13 (H) 0.00 - 0.07 K/uL    Comment: Performed at [facility] [address], [place], [place] [address]  Comprehensive metabolic panel     Status: Abnormal   Collection Time: 01/02/19  7:34 PM  Result Value Ref Range   Sodium 137 135 - 145 mmol/L   Potassium 3.8 3.5 - 5.1 mmol/L   Chloride 107 98 - 111 mmol/L   CO2 23 22 - 32 mmol/L   Glucose, Bld 116 (H) 70 - 99 mg/dL   BUN 19 8 - 23 mg/dL   Creatinine, Ser 0.95 0.44 - 1.00 mg/dL   Calcium 9.3 8.9 - 10.3 mg/dL   Total Protein 6.8 6.5 - 8.1 g/dL   Albumin 3.6 3.5 - 5.0 g/dL    AST 22 15 - 41 U/L   ALT 17 0 - 44 U/L   Alkaline Phosphatase 62 38 - 126 U/L   Total Bilirubin 0.7 0.3 - 1.2 mg/dL   GFR calc non Af Amer 50 (L) >60 mL/min   GFR calc Af Amer 58 (L) >60 mL/min   Anion gap 7 5 - 15    Comment: Performed at [facility] [address], [place], [place] [address]   Dg Hip Malvin Johns Or Wo Pelvis 2-3 Views Left  Result Date: 01/02/2019 CLINICAL DATA:  85 year old with osteoporosis. Fall with left pelvic and left hip pain. EXAM: DG HIP (WITH OR WITHOUT PELVIS) 2-3V LEFT COMPARISON:  Pelvic radiograph 02/01/2014 FINDINGS: Slight cortical irregularity of the left superior pubic ramus, not definitively seen on prior exam. This area is not as well assessed on the frogleg lateral view. Lateral plate and trans trochanteric screw fixation of the left proximal femur. Right hip arthroplasty. Pubic symphysis and sacroiliac joints are congruent. Degenerative change of the sacroiliac joints. Diffuse bony under mineralization. There are vascular calcifications. IMPRESSION: 1. Slight cortical irregularity of the left superior pubic ramus, suspicious for nondisplaced fracture. CT could be considered for further evaluation, however orthopedic hardware may cause significant streak artifact and limit assessment. 2. Intact right hip arthroplasty were visualized. Intact left hip ORIF hardware. Electronically Signed   By: Keith Rake M.D.   On: 01/02/2019 19:37    Pending Labs Unresulted Labs (From admission, onward)    Start     Ordered   01/02/19 1847  Urinalysis, Routine w reflex microscopic  Once,   R     01/02/19 1846   01/02/19 1847  Urine culture  ONCE - STAT,   STAT     01/02/19 1846          Vitals/Pain Today's Vitals   01/02/19 1835 01/02/19 1838 01/02/19 1930 01/02/19 2000  BP:  (!) 185/75  (!) 157/69  Pulse:  78 79 81  Resp:  16    Temp:  98 F (36.7 C)    TempSrc:  Oral    SpO2:  94% 92% (!) 89%  Weight: 98 lb 15.8 oz (44.9 kg)     Height: 5'  (1.524 m)     PainSc: 0-No pain       Isolation Precautions No active isolations  Medications Medications - No  data to display  Mobility walks with device High Fall Risk  Focused Assessments  (See notes)   R Recommendations: See Admitting Provider Note  Report given to: Randall Hiss RN  Additional Notes:   [name] (daughter) [phone] Has hearing aids but is still heard of hearing

## 2019-01-02 NOTE — ED Triage Notes (Signed)
Pt BIB [facility] for a fall today that happened around 1600. Pt resides at [facility]. Pt reports left hip pain and difficulty walking since her fall. Per EMS her family reports that she sometimes falls when she has a UTI and they would like her checked for one. Pt is alert and oriented x4 at present. Pt denies any pain when lying down but reports pain with movement of left lower extremity.

## 2019-01-02 NOTE — ED Notes (Signed)
Alert and oriented x4. Complaining of 3/10 left hip pain. Pt placed on purewick to acquire urine sample. 2+ pedal pulse bilaterally.

## 2019-01-03 ENCOUNTER — Encounter (HOSPITAL_COMMUNITY): Payer: Self-pay | Admitting: Internal Medicine

## 2019-01-03 DIAGNOSIS — W19XXXA Unspecified fall, initial encounter: Secondary | ICD-10-CM | POA: Diagnosis not present

## 2019-01-03 DIAGNOSIS — D72829 Elevated white blood cell count, unspecified: Secondary | ICD-10-CM | POA: Diagnosis not present

## 2019-01-03 DIAGNOSIS — S32502A Unspecified fracture of left pubis, initial encounter for closed fracture: Secondary | ICD-10-CM | POA: Diagnosis not present

## 2019-01-03 DIAGNOSIS — S329XXA Fracture of unspecified parts of lumbosacral spine and pelvis, initial encounter for closed fracture: Secondary | ICD-10-CM | POA: Diagnosis not present

## 2019-01-03 DIAGNOSIS — I10 Essential (primary) hypertension: Secondary | ICD-10-CM | POA: Diagnosis not present

## 2019-01-03 DIAGNOSIS — R296 Repeated falls: Secondary | ICD-10-CM | POA: Diagnosis not present

## 2019-01-03 LAB — BASIC METABOLIC PANEL
Anion gap: 9 (ref 5–15)
BUN: 19 mg/dL (ref 8–23)
CO2: 22 mmol/L (ref 22–32)
Calcium: 8.9 mg/dL (ref 8.9–10.3)
Chloride: 106 mmol/L (ref 98–111)
Creatinine, Ser: 0.93 mg/dL (ref 0.44–1.00)
GFR calc Af Amer: 60 mL/min — ABNORMAL LOW (ref >60)
GFR calc non Af Amer: 52 mL/min — ABNORMAL LOW (ref >60)
Glucose, Bld: 125 mg/dL — ABNORMAL HIGH (ref 70–99)
Potassium: 3.6 mmol/L (ref 3.5–5.1)
Sodium: 137 mmol/L (ref 135–145)

## 2019-01-03 LAB — CBC
HCT: 35.7 % — ABNORMAL LOW (ref 36.0–46.0)
Hemoglobin: 11.7 g/dL — ABNORMAL LOW (ref 12.0–15.0)
MCH: 30.6 pg (ref 26.0–34.0)
MCHC: 32.8 g/dL (ref 30.0–36.0)
MCV: 93.5 fL (ref 80.0–100.0)
Platelets: 279 10*3/uL (ref 150–400)
RBC: 3.82 MIL/uL — ABNORMAL LOW (ref 3.87–5.11)
RDW: 13.2 % (ref 11.5–15.5)
WBC: 12.6 10*3/uL — AB (ref 4.0–10.5)
nRBC: 0 % (ref 0.0–0.2)

## 2019-01-03 MED ORDER — NITROFURANTOIN MONOHYD MACRO 100 MG PO CAPS: 100.0000 mg | ORAL_CAPSULE | Freq: Two times a day (BID) | ORAL | Status: DC

## 2019-01-03 MED ORDER — MORPHINE SULFATE (PF) 2 MG/ML IV SOLN: 1.0000 mg | INTRAVENOUS | Status: DC | PRN

## 2019-01-03 MED ORDER — SODIUM CHLORIDE 0.9 % IV SOLN: INTRAVENOUS | Status: AC

## 2019-01-03 MED ORDER — CIPROFLOXACIN HCL 500 MG PO TABS
250.0000 mg | ORAL_TABLET | ORAL | Status: DC
  Administered 2019-01-03: 250 mg via ORAL
  Filled 2019-01-03: qty 1

## 2019-01-03 MED ORDER — ACETAMINOPHEN 325 MG PO TABS
650.0000 mg | ORAL_TABLET | Freq: Four times a day (QID) | ORAL | Status: DC | PRN
  Administered 2019-01-03: 650 mg via ORAL
  Filled 2019-01-03: qty 2

## 2019-01-03 MED ORDER — ONDANSETRON HCL 4 MG/2ML IJ SOLN: 4.0000 mg | Freq: Four times a day (QID) | INTRAMUSCULAR | Status: DC | PRN

## 2019-01-03 NOTE — Progress Notes (Addendum)
CSW acknowledges SNF consult pending PT/OT recs once patient off bedrest orders. Patient from [facility].   [place], [place]

## 2019-01-03 NOTE — Plan of Care (Signed)
  Problem: Activity: Goal: Ability to avoid complications of mobility impairment will improve Outcome: Progressing Goal: Ability to tolerate increased activity will improve Outcome: Progressing   Problem: Education: Goal: Verbalization of understanding the information provided will improve Outcome: Progressing   Problem: Respiratory: Goal: Ability to maintain a clear airway will improve Outcome: Progressing   Problem: Skin Integrity: Goal: Signs of wound healing will improve Outcome: Progressing

## 2019-01-03 NOTE — Evaluation (Signed)
Occupational Therapy Evaluation Patient Details Name: [name] MRN: [id number] DOB: 1919-01-31 Today's Date: 01/03/2019    History of Present Illness Admitted after fall resulting in L superior pubic ramus fracture; fracture is stable; per Ortho can mobilize, WBAT; gait disturbance with RW use full-time prior to this admission;  has a past medical history of Cancer of the skin, basal cell (2008), Degenerative joint disease (DJD) of hip, Depression, Gait disorder, Hypertension, Hypokalemia, Iron deficiency anemia, Osteoporosis, and Pelvic fracture ([place]).   Clinical Impression   PTA, pt was living at [facility] and received assisted for dressing/bathing; used RW for mobility. Pt currently requiring Min A for UB ADLs, Mod-Max A for LB ADLs, and Min-Mod A for functional transfers. Pt presenting with decreased strength and balance requiring increased assistance for forward weight shift in standing. Pt would benefit from further acute OT to facilitate safe dc. Recommend dc to SNF for further OT to optimize safety, independence with ADLs, and return to PLOF.      Follow Up Recommendations  SNF;Supervision/Assistance - 24 hour    Equipment Recommendations  None recommended by OT    Recommendations for Other Services PT consult     Precautions / Restrictions Precautions Precautions: Fall Restrictions Weight Bearing Restrictions: Yes RUE Weight Bearing: Weight bearing as tolerated      Mobility Bed Mobility Overal bed mobility: Needs Assistance Bed Mobility: Supine to Sit     Supine to sit: Mod assist;+2 for safety/equipment     General bed mobility comments: Cues to initiate; mod assist and use of bed pad to manuever hips to EOB; handheld assist and support in front and back to elevate trunk to sit  Transfers Overall transfer level: Needs assistance Equipment used: Rolling walker (2 wheeled) Transfers: Sit to/from [facility] Sit to Stand: Min assist;+2  safety/equipment Stand pivot transfers: Min assist;Mod assist;+2 safety/equipment       General transfer comment: Stood overall well, though tended to pull up on RW; after initial sit to stand, pt sat back to bed without warning; when asked why she sat down she said, "because I wanted to" -- she did not indicate any pain, but pain could be a reason she wanted to sit back down; second stand more successful; heavy posterior lean both times requiring mod assist to steady and get center of mass over feet    Balance Overall balance assessment: Needs assistance Sitting-balance support: Bilateral upper extremity supported Sitting balance-Leahy Scale: Fair       Standing balance-Leahy Scale: Zero(approaching Poor) Standing balance comment: physical assist to get wieght forward in standing                           ADL either performed or assessed with clinical judgement   ADL Overall ADL's : Needs assistance/impaired Eating/Feeding: Independent;Sitting   Grooming: Supervision/safety;Set up;Sitting   Upper Body Bathing: Minimal assistance;Sitting   Lower Body Bathing: Moderate assistance;Sit to/from stand   Upper Body Dressing : Minimal assistance;Sitting   Lower Body Dressing: Maximal assistance;Sit to/from stand   Toilet Transfer: Minimal assistance;Moderate assistance;Stand-pivot;RW(simulated to recliner) Toilet Transfer Details (indicate cue type and reason): Min-Mod A for posterior lean in standing         Functional mobility during ADLs: Minimal assistance;Moderate assistance;Rolling walker(stand pivot only) General ADL Comments: Pt presenting with decreased strength and balance     Vision         Perception     Praxis  Pertinent Vitals/Pain Pain Assessment: Faces Faces Pain Scale: Hurts little more Pain Location: L hip/groin Pain Descriptors / Indicators: Aching Pain Intervention(s): Monitored during session;Limited activity within patient's  tolerance;Repositioned     Hand Dominance     Extremity/Trunk Assessment Upper Extremity Assessment Upper Extremity Assessment: Generalized weakness   Lower Extremity Assessment Lower Extremity Assessment: Defer to PT evaluation;LLE deficits/detail RLE Deficits / Details: Pain from L superior pubic ramus fracture limiting hip ROM and Overall tolerance of standing and weight bearing LLE Deficits / Details: Pain from L superior pubic ramus fracture limiting hip ROM and Overall tolerance of standing and weight bearing       Communication Communication Communication: HOH   Cognition Arousal/Alertness: Awake/alert Behavior During Therapy: WFL for tasks assessed/performed Overall Cognitive Status: No family/caregiver present to determine baseline cognitive functioning                                 General Comments: Feel at baseline. Difficult to assess due to [place] conducted on Room Air, and O2 sats 94% in chair post transfer    Exercises     Shoulder Instructions      Home Living Family/patient expects to be discharged to:: Skilled nursing facility                                 Additional Comments: From [facility]; uses RW at baseline.      Prior Functioning/Environment Level of Independence: Needs assistance  Gait / Transfers Assistance Needed: Uses RW ADL's / Homemaking Assistance Needed: Reports someone assists with dressing            OT Problem List: Decreased strength;Decreased range of motion;Decreased activity tolerance;Impaired balance (sitting and/or standing);Decreased knowledge of use of DME or AE;Decreased knowledge of precautions;Pain      OT Treatment/Interventions: Self-care/ADL training;Therapeutic exercise;Energy conservation;DME and/or AE instruction;Therapeutic activities;Patient/family education    OT Goals(Current goals can be found in the care plan section) Acute Rehab OT  Goals Patient Stated Goal: did not specifically state, but agreeable to getting OOB to recliner OT Goal Formulation: With patient Time For Goal Achievement: 01/17/19 Potential to Achieve Goals: Good  OT Frequency: Min 2X/week   Barriers to D/C:            Co-evaluation              AM-PAC OT "6 Clicks" Daily Activity     Outcome Measure Help from another person eating meals?: None Help from another person taking care of personal grooming?: A Little Help from another person toileting, which includes using toliet, bedpan, or urinal?: A Lot Help from another person bathing (including washing, rinsing, drying)?: A Lot Help from another person to put on and taking off regular upper body clothing?: A Little Help from another person to put on and taking off regular lower body clothing?: A Lot 6 Click Score: 16   End of Session Equipment Utilized During Treatment: Gait belt;Rolling walker Nurse Communication: Mobility status  Activity Tolerance: Patient tolerated treatment well;Patient limited by pain Patient left: in chair;with call bell/phone within reach;with chair alarm set  OT Visit Diagnosis: Unsteadiness on feet (R26.81);Other abnormalities of gait and mobility (R26.89);Muscle weakness (generalized) (M62.81);Pain Pain - Right/Left: Left Pain - part of body: Leg  Time: 1947-1252 OT Time Calculation (min): 19 min Charges:  OT General Charges $OT Visit: 1 Visit OT Evaluation $OT Eval Moderate Complexity: [place], OTR/L Acute Rehab Pager: [phone] Office: [place] 01/03/2019, 12:43 PM

## 2019-01-03 NOTE — Progress Notes (Signed)
Patient refused IV placement multiple attempts. Education provided. Patient states that she is eating and drinking and do not need IV.

## 2019-01-03 NOTE — Progress Notes (Signed)
PT Cancellation Note  Patient Details Name: [name] MRN: [id number] DOB: Apr 19, 1919   Cancelled Treatment:    Reason Eval/Treat Not Completed: Active bedrest order   Will need increased activity orders to proceed with PT evaluation;   Will check back as able,   Roney Marion, [place] Pager [phone] Office [phone]    Colletta [place] 01/03/2019, 7:23 AM

## 2019-01-03 NOTE — TOC Initial Note (Signed)
Transition of Care [facility]) - Initial/Assessment Note    Patient Details  Name: [name] MRN: [id number] Date of Birth: [birth year]  Transition of Care [facility]) CM/SW Contact:    Alberteen Sam, LCSW Phone Number: 01/03/2019, 9:21 AM  Clinical Narrative:                  CSW consulted with patient's daughter [name] regarding anticipation of possible SNF placement. CSW informed patient's daughter that PT/OT were unable to work with patient this morning due to bedrest order, however once their recommendations are in [place] is able to send referrals to SNF options. [name] reports understanding possible PT/OT rec of SNF and is in agreement with this plan of short term rehab before patient returns to [place]. [name] states patient had living well at home coming out to [facility] to assist patient with bathing and medication, however she believes due to recent multiple falls that patient would be best suited for SNF for short term rehab. Patient's daughter [name] reports being neighbors and friends with Dr Durward Fortes. She reports preference for [facility], [facility] or [facility]. [name] reports patient has Medicare coverage not listed on chart and texted Medicare card picture to [place]. Patient has part A and B with medicare number of [id number]. CSW will initiate referral process to SNFs and update family with bed offers avail. Visitation restrictions reviewed with family, family in understanding of restrictions currently in [facility] and at SNFs.  Expected Discharge Plan: Skilled Nursing Facility Barriers to Discharge: No Barriers Identified   Patient Goals and CMS Choice Patient states their goals for this hospitalization and ongoing recovery are:: to go to rehab then go hom to [place] CMS Medicare.gov Compare Post Acute Care list provided to:: Patient Represenative (must comment)([name] (daughter)) Choice offered to / list presented to : Adult Children  Expected Discharge  Plan and Services Expected Discharge Plan: [place]   Discharge Planning Services: NA Post Acute Care Choice: [place] Living arrangements for the past 2 months: Assisted Living Facility([facility])                 DME Arranged: N/A DME Agency: NA HH Arranged: NA HH Agency: NA  Prior Living Arrangements/Services Living arrangements for the past 2 months: Assisted Living Facility([facility]) Lives with:: Self Patient language and need for interpreter reviewed:: Yes Do you feel safe going back to the place where you live?: No   needs short term rehab before safe discharge back to [facility]  Need for Family Participation in Patient Care: Yes (Comment) Care giver support system in place?: Yes (comment) Current home services: Other (comment)(daughter reports she has living well at home that comes to assist with bathing and medications) Criminal Activity/Legal Involvement Pertinent to Current Situation/Hospitalization: No - Comment as needed  Activities of Daily Living Home Assistive Devices/Equipment: Gilford Rile (specify type) ADL Screening (condition at time of admission) Patient's cognitive ability adequate to safely complete daily activities?: No Is the patient deaf or have difficulty hearing?: Yes Does the patient have difficulty seeing, even when wearing glasses/contacts?: Yes Does the patient have difficulty concentrating, remembering, or making decisions?: No Patient able to express need for assistance with ADLs?: No Does the patient have difficulty dressing or bathing?: Yes Independently performs ADLs?: No Communication: Independent Dressing (OT): Needs assistance Is this a change from baseline?: Pre-admission baseline Grooming: Needs assistance Is this a change from baseline?: Pre-admission baseline Feeding: Independent Bathing: Needs assistance Is this a change from baseline?: Pre-admission baseline Toileting: Needs  assistance Is this a  change from baseline?: Pre-admission baseline In/Out Bed: Needs assistance Is this a change from baseline?: Pre-admission baseline Walks in Home: Independent with device (comment) Does the patient have difficulty walking or climbing stairs?: Yes Weakness of Legs: Both Weakness of Arms/Hands: None  Permission Sought/Granted Permission sought to share information with : Case Manager, Customer service manager, Family Supports Permission granted to share information with : Yes, Verbal Permission Granted  Share Information with NAME: [name]  Permission granted to share info w AGENCY: SNFs  Permission granted to share info w Relationship: daughter  Permission granted to share info w Contact Information: [phone]  Emotional Assessment Appearance:: Appears stated age Attitude/Demeanor/Rapport: Gracious Affect (typically observed): Accepting Orientation: : Oriented to Self, Oriented to Place, Oriented to  Time, Oriented to Situation Alcohol / Substance Use: Not Applicable Psych Involvement: No (comment)  Admission diagnosis:  Hypoxia [R09.02] Fall, initial encounter [W19.XXXA] Closed nondisplaced fracture of pelvis, unspecified part of pelvis, initial encounter ([place]) [S32.9XXA] Patient Active Problem List   Diagnosis Date Noted  . Leukocytosis 01/02/2019  . Pubic ramus fracture ([place]) 01/02/2019  . Hip pain 01/02/2019  . Essential hypertension 01/02/2019  . Hypokalemia 07/09/2017  . Subdural hematoma ([place]) 07/08/2017  . Frequent falls 07/08/2017  . Acute lower UTI 07/08/2017  . Acute metabolic encephalopathy 35/82/5189  . Fall    PCP:  Gaynelle Arabian, MD Pharmacy:   [facility] [place], [address] [place] [address] Phone: [phone] Fax: [phone]  [place], [place] [address] [place] [address] Phone: [phone] Fax: [phone]     Social Determinants of Health (SDOH) Interventions    Readmission Risk Interventions No flowsheet data found.

## 2019-01-03 NOTE — Evaluation (Signed)
Physical Therapy Evaluation Patient Details Name: [name] MRN: [id number] DOB: Apr 14, 1919 Today's Date: 01/03/2019   History of Present Illness  Admitted after fall resulting in L superior pubic ramus fracture; fracture is stable; per Ortho can mobilize, WBAT; gait disturbance with RW use full-time prior to this admission;  has a past medical history of Cancer of the skin, basal cell (2008), Degenerative joint disease (DJD) of hip, Depression, Gait disorder, Hypertension, Hypokalemia, Iron deficiency anemia, Osteoporosis, and Pelvic fracture ([place]).  Clinical Impression   Pt admitted with above diagnosis. Pt currently with functional limitations due to the deficits listed below (see PT Problem List). From [facility]; using RW full-time prior to admission; Presents with decr functional mobility, decr activity tolerance, incr fall risk;  Pt will benefit from skilled PT to increase their independence and safety with mobility to allow discharge to the venue listed below.       Follow Up Recommendations SNF    Equipment Recommendations  Rolling walker with 5" wheels;3in1 (PT);Other (comment)(Youth-sized)    Recommendations for Other Services OT consult     Precautions / Restrictions Precautions Precautions: Fall Restrictions Weight Bearing Restrictions: Yes RUE Weight Bearing: Weight bearing as tolerated      Mobility  Bed Mobility Overal bed mobility: Needs Assistance Bed Mobility: Supine to Sit     Supine to sit: Mod assist;+2 for safety/equipment     General bed mobility comments: Cues to initiate; mod assist and use of bed pad to manuever hips to EOB; handheld assist and support in front and back to elevate trunk to sit  Transfers Overall transfer level: Needs assistance Equipment used: Rolling walker (2 wheeled) Transfers: Sit to/from Stand Sit to Stand: Min assist;+2 safety/equipment         General transfer comment: Stood overall well, though tended to  pull up on RW; after initial sit to stand, pt sat back to bed without warning; when asked why she sat down she said, "because I wanted to" -- she did not indicate any pain, but pain could be a reason she wanted to sit back down; second stand more successful; heavy posterior lean both times requiring mod assist to steady and get center of mass over feet  Ambulation/Gait Ambulation/Gait assistance: Min assist;+2 safety/equipment Gait Distance (Feet): (pivotal steps bed to chair) Assistive device: Rolling walker (2 wheeled) Gait Pattern/deviations: Decreased step length - right;Decreased step length - left     General Gait Details: Good use of RW to unweigh painful LLE; used a youth-sized RW for proper Copy Rankin (Stroke Patients Only)       Balance Overall balance assessment: Needs assistance Sitting-balance support: Bilateral upper extremity supported Sitting balance-Leahy Scale: Fair       Standing balance-Leahy Scale: Zero(approaching Poor) Standing balance comment: physical assist to get wieght forward in standing                             Pertinent Vitals/Pain Pain Assessment: Faces Faces Pain Scale: Hurts little more Pain Location: L hip/groin Pain Descriptors / Indicators: Aching Pain Intervention(s): Monitored during session;Repositioned    Home Living Family/patient expects to be discharged to:: Skilled nursing facility                 Additional Comments: From [facility]; uses RW at baseline    Prior Function Level  of Independence: Needs assistance               Hand Dominance        Extremity/Trunk Assessment   Upper Extremity Assessment Upper Extremity Assessment: Defer to OT evaluation    Lower Extremity Assessment Lower Extremity Assessment: Generalized weakness;RLE deficits/detail;LLE deficits/detail RLE Deficits / Details: Pain from L superior pubic ramus  fracture limiting hip ROM and Overall tolerance of standing and weight bearing LLE Deficits / Details: Pain from L superior pubic ramus fracture limiting hip ROM and Overall tolerance of standing and weight bearing       Communication   Communication: HOH  Cognition Arousal/Alertness: Awake/alert Behavior During Therapy: WFL for tasks assessed/performed Overall Cognitive Status: No family/caregiver present to determine baseline cognitive functioning                                        General Comments General comments (skin integrity, edema, etc.): Session conducted on Room Air, and O2 sats 94% in chair post transfer    Exercises     Assessment/Plan    PT Assessment Patient needs continued PT services  PT Problem List Decreased strength;Decreased range of motion;Decreased activity tolerance;Decreased balance;Decreased mobility;Decreased cognition;Decreased coordination;Decreased safety awareness;Decreased knowledge of use of DME;Decreased knowledge of precautions;Pain       PT Treatment Interventions DME instruction;Gait training;Functional mobility training;Therapeutic activities;Therapeutic exercise;Balance training;Neuromuscular re-education;Cognitive remediation;Patient/family education    PT Goals (Current goals can be found in the Care Plan section)  Acute Rehab PT Goals Patient Stated Goal: did not specifically state, but agreeable to getting OOB to recliner PT Goal Formulation: With patient Time For Goal Achievement: 01/17/19 Potential to Achieve Goals: Good    Frequency Min 2X/week   Barriers to discharge        Co-evaluation               AM-PAC PT "6 Clicks" Mobility  Outcome Measure Help needed turning from your back to your side while in a flat bed without using bedrails?: A Little Help needed moving from lying on your back to sitting on the side of a flat bed without using bedrails?: A Lot Help needed moving to and from a bed to a  chair (including a wheelchair)?: A Lot Help needed standing up from a chair using your arms (e.g., wheelchair or bedside chair)?: A Lot Help needed to walk in hospital room?: A Little Help needed climbing 3-5 steps with a railing? : A Lot 6 Click Score: 14    End of Session Equipment Utilized During Treatment: Gait belt Activity Tolerance: Patient tolerated treatment well Patient left: in chair;with call bell/phone within reach;with chair alarm set Nurse Communication: Mobility status PT Visit Diagnosis: Unsteadiness on feet (R26.81);Other abnormalities of gait and mobility (R26.89);History of falling (Z91.81)    Time: 5397-6734 PT Time Calculation (min) (ACUTE ONLY): 19 min   Charges:   PT Evaluation $PT Eval Moderate Complexity: 1 Mod          Roney Marion, [place]  Acute Rehabilitation Services Pager [phone] Office [phone]   Colletta [place] 01/03/2019, 11:09 AM

## 2019-01-03 NOTE — NC FL2 (Signed)
[place] MEDICAID FL2 LEVEL OF CARE SCREENING TOOL     IDENTIFICATION  Patient Name: [name] Birthdate: 01-03-1919 Sex: female Admission Date (Current Location): 01/02/2019  [facility] and [place] Number:  Herbalist and Address:  [facility] [place], [place] [address], [place], [place] [address]      Provider Number: [id number]  Attending Physician Name and Address:  Norval Morton, MD  Relative Name and Phone Number:  [name] (daughter) [phone]    Current Level of Care: Hospital Recommended Level of Care: [place] Prior Approval Number:    Date Approved/Denied: 09/18/04 PASRR Number: [id number]  Discharge Plan: SNF    Current Diagnoses: Patient Active Problem List   Diagnosis Date Noted  . Leukocytosis 01/02/2019  . Pubic ramus fracture ([place]) 01/02/2019  . Hip pain 01/02/2019  . Essential hypertension 01/02/2019  . Hypokalemia 07/09/2017  . Subdural hematoma ([place]) 07/08/2017  . Frequent falls 07/08/2017  . Acute lower UTI 07/08/2017  . Acute metabolic encephalopathy 37/16/9678  . Fall     Orientation RESPIRATION BLADDER Height & Weight     Self, Time, Place, Situation  Normal Incontinent, External catheter Weight: 98 lb 15.8 oz (44.9 kg) Height:  5' (152.4 cm)  BEHAVIORAL SYMPTOMS/MOOD NEUROLOGICAL BOWEL NUTRITION STATUS      Continent Diet(see discharge summary)  AMBULATORY STATUS COMMUNICATION OF NEEDS Skin   Limited Assist(rolling walker at baseline) Verbally Other (Comment), Skin abrasions(left knee skin abrasion)                       Personal Care Assistance Level of Assistance  Bathing, Total care, Feeding, Dressing Bathing Assistance: Limited assistance Feeding assistance: Independent Dressing Assistance: Limited assistance Total Care Assistance: Limited assistance   Functional Limitations Info  Sight, Hearing, Speech Sight Info: Adequate Hearing Info: Impaired(deaf, hearing  aids) Speech Info: Adequate    SPECIAL CARE FACTORS FREQUENCY  PT (By licensed PT), OT (By licensed OT)     PT Frequency: min 5x weekly OT Frequency: min 5x weekly            Contractures Contractures Info: Not present    Additional Factors Info  Code Status, Allergies Code Status Info: DNR Allergies Info: Dicloxacillin, hazel nuts, Pork-derived products, Septra (sulfamethoxazoletrimethoprim)           Current Medications (01/03/2019):  This is the current hospital active medication list Current Facility-Administered Medications  Medication Dose Route Frequency Provider Last Rate Last Dose  . 0.9 %  sodium chloride infusion   Intravenous Continuous Black, Lezlie Octave, NP      . acetaminophen (TYLENOL) tablet 650 mg  650 mg Oral Q6H PRN Black, Karen M, NP      . amLODipine (NORVASC) tablet 2.5 mg  2.5 mg Oral Daily Doutova, Anastassia, MD      . HYDROcodone-acetaminophen (NORCO/VICODIN) 5-325 MG per tablet 1-2 tablet  1-2 tablet Oral Q6H PRN Toy Baker, MD      . methocarbamol (ROBAXIN) tablet 500 mg  500 mg Oral Q6H PRN Doutova, Anastassia, MD       Or  . methocarbamol (ROBAXIN) 500 mg in dextrose 5 % 50 mL IVPB  500 mg Intravenous Q6H PRN Doutova, Anastassia, MD      . morphine 2 MG/ML injection 1 mg  1 mg Intravenous Q4H PRN Black, Karen M, NP      . ondansetron (ZOFRAN) injection 4 mg  4 mg Intravenous Q6H PRN Black, Lezlie Octave, NP      .  senna (SENOKOT) tablet 8.6 mg  1 tablet Oral BID Toy Baker, MD         Discharge Medications: Please see discharge summary for a list of discharge medications.  Relevant Imaging Results:  Relevant Lab Results:   Additional Information SSN: [id number]  Alberteen Sam, LCSW

## 2019-01-03 NOTE — Progress Notes (Signed)
[facility] PROGRESS NOTE  [name] PYP:[id number] DOB: Sep 12, 1919 DOA: 01/02/2019 PCP: Gaynelle Arabian, MD  Assessment/Plan:  . Pubic ramus fracture ([place]). Secondary to mechanical fall. At baseline patient has gait disturbance and needs walker. Family reports some falls usually associated with UTI. No s/sx infection, no metabolic derangement. Dr. Durward Fortes ortho recommends WBAT.  -pain management -mobilze -PT eval -social work for placement  . Marland Kitchen Essential hypertension -fair control. Home meds include  Norvasc -continue home meds -pain control  . Leukocytosis -in the setting of a fall and fracture likely stress demargination. Trending downward. Max temp 99.3. chest xray with atelectasis. Oxygen saturation level greater than 90% on room air -incentive spirometry -monitor VS closely -mobilize -recheck in am    Code Status: dnr Family Communication: spoke to daughter and son in law on phone.  Disposition Plan: snf when bed available. Hopefully tomorrow   Consultants:  Curbside dr Durward Fortes  Procedures:    Antibiotics:    HPI/Subjective: Presented with a mechanical fall 3/28 and since then have had trouble ambulating. At baseline she has "gait disturbance" per family and requires a walker.  She  has history of frequent falls especially when she has a UTI.she denied  dysuria no fevers or chills no cough no shortness of breath no travel history.  She had severe pain in her pelvis with ambulation. Fall not associated chest pain abdominal pain no headaches no shortness of breath. Patient is a family friend of Dr. Durward Fortes ortho who felt she would benefit from further evaluation patient was brought in to [facility] emergency department.  Awake alert this am. Happy to hear no surgery and ready to get out of bed.   Objective: Vitals:   01/03/19 0438 01/03/19 0854  BP: 133/65 (!) 150/70  Pulse: 80 73  Resp: 14 20  Temp: 99 F (37.2 C) 99.3 F (37.4 C)   SpO2: 92% 92%    Intake/Output Summary (Last 24 hours) at 01/03/2019 0937 Last data filed at 01/03/2019 0500 Gross per 24 hour  Intake -  Output 400 ml  Net -400 ml   Filed Weights   01/02/19 1835  Weight: 44.9 kg    Exam:   General:  Awake alert oriented to self and place. Very HOH  Cardiovascular: rrr no mgr no LE edema  Respiratory: normal effort BS slightly diminished. No crackles or wheezes  Abdomen: soft +BS non-tender no guarding or rebounding  Musculoskeletal: joints with swelling/erythema. Decreased hip rom due to pain.    Data Reviewed: Basic Metabolic Panel: Recent Labs  Lab 01/02/19 1934 01/03/19 0304  NA 137 137  K 3.8 3.6  CL 107 106  CO2 23 22  GLUCOSE 116* 125*  BUN 19 19  CREATININE 0.95 0.93  CALCIUM 9.3 8.9   Liver Function Tests: Recent Labs  Lab 01/02/19 1934  AST 22  ALT 17  ALKPHOS 62  BILITOT 0.7  PROT 6.8  ALBUMIN 3.6  3.6   No results for input(s): LIPASE, AMYLASE in the last 168 hours. No results for input(s): AMMONIA in the last 168 hours. CBC: Recent Labs  Lab 01/02/19 1934 01/03/19 0304  WBC 17.4* 12.6*  NEUTROABS 14.6*  --   HGB 12.7 11.7*  HCT 41.5 35.7*  MCV 98.8 93.5  PLT 305 279   Cardiac Enzymes: No results for input(s): CKTOTAL, CKMB, CKMBINDEX, TROPONINI in the last 168 hours. BNP (last 3 results) No results for input(s): BNP in the last 8760 hours.  ProBNP (last 3 results)  No results for input(s): PROBNP in the last 8760 hours.  CBG: No results for input(s): GLUCAP in the last 168 hours.  No results found for this or any previous visit (from the past 240 hour(s)).   Studies: Dg Chest Port 1 View  Result Date: 01/02/2019 CLINICAL DATA:  Hypoxia. EXAM: PORTABLE CHEST 1 VIEW COMPARISON:  Chest radiograph and CT 09/16/2010 FINDINGS: Patient is rotated. Low lung volumes. Vague bibasilar opacities favoring atelectasis. Biapical pleuroparenchymal scarring as before. Heart is normal in size for  technique. Atherosclerosis of the thoracic aorta. No pulmonary edema, confluent airspace disease, pleural effusion or pneumothorax. Remote bilateral rib fractures. IMPRESSION: 1. Low lung volumes with bibasilar opacities favoring atelectasis. 2.  Aortic Atherosclerosis (ICD10-I70.0). Electronically Signed   By: Keith Rake M.D.   On: 01/02/2019 21:37   Dg Hip Unilat W Or Wo Pelvis 2-3 Views Left  Result Date: 01/02/2019 CLINICAL DATA:  85 year old with osteoporosis. Fall with left pelvic and left hip pain. EXAM: DG HIP (WITH OR WITHOUT PELVIS) 2-3V LEFT COMPARISON:  Pelvic radiograph 02/01/2014 FINDINGS: Slight cortical irregularity of the left superior pubic ramus, not definitively seen on prior exam. This area is not as well assessed on the frogleg lateral view. Lateral plate and trans trochanteric screw fixation of the left proximal femur. Right hip arthroplasty. Pubic symphysis and sacroiliac joints are congruent. Degenerative change of the sacroiliac joints. Diffuse bony under mineralization. There are vascular calcifications. IMPRESSION: 1. Slight cortical irregularity of the left superior pubic ramus, suspicious for nondisplaced fracture. CT could be considered for further evaluation, however orthopedic hardware may cause significant streak artifact and limit assessment. 2. Intact right hip arthroplasty were visualized. Intact left hip ORIF hardware. Electronically Signed   By: Keith Rake M.D.   On: 01/02/2019 19:37    Scheduled Meds: . amLODipine  2.5 mg Oral Daily  . senna  1 tablet Oral BID   Continuous Infusions: . sodium chloride    . methocarbamol (ROBAXIN) IV      Principal Problem:   Pubic ramus fracture (HCC) Active Problems:   Hip pain   Frequent falls   Essential hypertension   Leukocytosis    Time spent: 61 minutes    [place] NP  [facility]  If 7PM-7AM, please contact night-coverage at [email or web address], password [facility] 01/03/2019, 9:37 AM  LOS: 0  days

## 2019-01-03 NOTE — Progress Notes (Signed)
OT Cancellation Note  Patient Details Name: VYLET MAFFIA MRN: [id number] DOB: 04-Jan-1919   Cancelled Treatment:    Reason Eval/Treat Not Completed: Active bedrest order. Will return as schedule allows. Thank you.  [place], OTR/L Acute Rehab Pager: [phone] Office: [phone] 01/03/2019, 7:23 AM

## 2019-01-04 DIAGNOSIS — R278 Other lack of coordination: Secondary | ICD-10-CM | POA: Diagnosis not present

## 2019-01-04 DIAGNOSIS — F329 Major depressive disorder, single episode, unspecified: Secondary | ICD-10-CM | POA: Diagnosis not present

## 2019-01-04 DIAGNOSIS — W19XXXD Unspecified fall, subsequent encounter: Secondary | ICD-10-CM | POA: Diagnosis not present

## 2019-01-04 DIAGNOSIS — S32502A Unspecified fracture of left pubis, initial encounter for closed fracture: Secondary | ICD-10-CM | POA: Diagnosis not present

## 2019-01-04 DIAGNOSIS — R269 Unspecified abnormalities of gait and mobility: Secondary | ICD-10-CM | POA: Diagnosis not present

## 2019-01-04 DIAGNOSIS — R2689 Other abnormalities of gait and mobility: Secondary | ICD-10-CM | POA: Diagnosis not present

## 2019-01-04 DIAGNOSIS — S32599D Other specified fracture of unspecified pubis, subsequent encounter for fracture with routine healing: Secondary | ICD-10-CM | POA: Diagnosis not present

## 2019-01-04 DIAGNOSIS — N390 Urinary tract infection, site not specified: Secondary | ICD-10-CM

## 2019-01-04 DIAGNOSIS — M25552 Pain in left hip: Secondary | ICD-10-CM | POA: Diagnosis not present

## 2019-01-04 DIAGNOSIS — F0390 Unspecified dementia without behavioral disturbance: Secondary | ICD-10-CM | POA: Diagnosis not present

## 2019-01-04 DIAGNOSIS — S32592D Other specified fracture of left pubis, subsequent encounter for fracture with routine healing: Secondary | ICD-10-CM | POA: Diagnosis not present

## 2019-01-04 DIAGNOSIS — S329XXA Fracture of unspecified parts of lumbosacral spine and pelvis, initial encounter for closed fracture: Secondary | ICD-10-CM | POA: Diagnosis not present

## 2019-01-04 DIAGNOSIS — R52 Pain, unspecified: Secondary | ICD-10-CM | POA: Diagnosis not present

## 2019-01-04 DIAGNOSIS — D649 Anemia, unspecified: Secondary | ICD-10-CM | POA: Diagnosis present

## 2019-01-04 DIAGNOSIS — R5381 Other malaise: Secondary | ICD-10-CM | POA: Diagnosis not present

## 2019-01-04 DIAGNOSIS — Z7401 Bed confinement status: Secondary | ICD-10-CM | POA: Diagnosis not present

## 2019-01-04 DIAGNOSIS — I10 Essential (primary) hypertension: Secondary | ICD-10-CM | POA: Diagnosis not present

## 2019-01-04 DIAGNOSIS — F322 Major depressive disorder, single episode, severe without psychotic features: Secondary | ICD-10-CM | POA: Diagnosis not present

## 2019-01-04 DIAGNOSIS — W19XXXA Unspecified fall, initial encounter: Secondary | ICD-10-CM | POA: Diagnosis not present

## 2019-01-04 DIAGNOSIS — M2550 Pain in unspecified joint: Secondary | ICD-10-CM | POA: Diagnosis not present

## 2019-01-04 DIAGNOSIS — D6489 Other specified anemias: Secondary | ICD-10-CM | POA: Diagnosis not present

## 2019-01-04 DIAGNOSIS — R0902 Hypoxemia: Secondary | ICD-10-CM | POA: Diagnosis not present

## 2019-01-04 DIAGNOSIS — R296 Repeated falls: Secondary | ICD-10-CM | POA: Diagnosis not present

## 2019-01-04 DIAGNOSIS — S32592A Other specified fracture of left pubis, initial encounter for closed fracture: Secondary | ICD-10-CM | POA: Diagnosis not present

## 2019-01-04 DIAGNOSIS — D72829 Elevated white blood cell count, unspecified: Secondary | ICD-10-CM | POA: Diagnosis not present

## 2019-01-04 LAB — URINE CULTURE: Culture: >=100000 — AB

## 2019-01-04 LAB — VITAMIN D 25 HYDROXY (VIT D DEFICIENCY, FRACTURES): Vit D, 25-Hydroxy: 41.4 ng/mL (ref 30.0–100.0)

## 2019-01-04 MED ORDER — HYDROCODONE-ACETAMINOPHEN 5-325 MG PO TABS: 1.0000 | ORAL_TABLET | Freq: Four times a day (QID) | ORAL | 0 refills | Status: DC | PRN

## 2019-01-04 MED ORDER — CIPROFLOXACIN HCL 250 MG PO TABS: 250.0000 mg | ORAL_TABLET | ORAL | 0 refills | Status: DC

## 2019-01-04 MED ORDER — CEPHALEXIN 250 MG PO CAPS: 250.0000 mg | ORAL_CAPSULE | Freq: Three times a day (TID) | ORAL | 0 refills | Status: DC

## 2019-01-04 NOTE — Progress Notes (Deleted)
[name], is a 85 y.o. female  DOB January 10, 1919  MRN [id number].  Admission date:  01/02/2019  Admitting Physician  Toy Baker, MD  Discharge Date:  01/04/2019   Primary MD  Gaynelle Arabian, MD  Recommendations for primary care physician for things to follow:   Follow-up on urine culture     Discharge Diagnosis   Principal Problem:   Pubic ramus fracture (HCC) Active Problems:   Frequent falls   Leukocytosis   Hip pain   Essential hypertension   Normocytic anemia      Past Medical History:  Diagnosis Date   Cancer of the skin, basal cell 2008   FACE   Degenerative joint disease (DJD) of hip    Depression    Gait disorder    Hypertension    Hypokalemia    Iron deficiency anemia    Osteoporosis    Pelvic fracture [facility])     Past Surgical History:  Procedure Laterality Date   CATARACT EXTRACTION, BILATERAL     HIP FRACTURE SURGERY  09/2004   DR. WHITFIELD   NERVE ENTRAPMENT     LEFT THUMB   SPINAL FUSION  1996   L3-4 [facility]   WRIST FRACTURE SURGERY     DR. Daylene Katayama       HPI  from the history and physical done on the day of admission:  [name] is a 85 y.o. female with medical history significant of HTN, frequent falls, frequent UTI's, small subdural hematoma in 2018, bilateral hip replacemetns    Presented with patient had a mechanical fall today and since then have had trouble ambulating. She walks regularly with a walker.  Family thinks she could have fallen twice.  She is has history of frequent falls especially when she has a UTI she denies currently dysuria no fevers or chills no cough no shortness of breath no travel history.  She has severe pain in her pelvis with ambulation at her baseline she walks with a walker but has not been able to ambulate  since the fall Was no associated chest pain abdominal pain no headaches no shortness of breath. Patient is a family friend of Dr. Donne Hazel who felt that she would benefit from further evaluation patient was brought in to [facility] emergency department.  Regarding pertinent Chronic problems:  Hx Of hypertension for which she takes amlodipine  While in ER: Plain film showing evidence of pelvic fracture superior ramus. ER provider spoke with Dr. Garnette Czech with orthopedics Commence admission for PT OT evaluation and pain management She did noticed to have some leukocytosis on arrival which thought to be secondary to stress demargination. The following Work up has been ordered so far:   Hospital Course:    1.  Pubic ramus fracture 2/2 Fall: Acute.  Secondary to mechanical fall.At baseline patient has gait disturbance and needs walker. Family reports some falls usually associated with UTI. No s/sx infection, no metabolic derangement. Dr. Durward Fortes orthopedic recommends weightbearing as tolerated.  Hydrocodone given as needed  for pain.  Physical therapy recommending skilled nursing facility placement.  2. Essential hypertension: Fair control. Home meds includeNorvasc which was continued.  3.Leukocytosis: Acute.  WBC trending down 17.4 -> 12.6.  In the setting of a fall and fracture elevation most likely due to stress demargination.  Max temp 99.3. chest xray with atelectasis. Oxygen saturation level greater than 90% on room air.  Patient was provided with incentive spirometry.  4.  Abnormal urinalysis, history of recurrent UTI: Acute. Urinalysis was positive for > 50 WBCs, but no bacteria seen.  Urine culture sent and pending re-incubation.  Patient was started on ciprofloxacin 250 mg daily adjusted for kidney function complete 7-day course.  5.  Normocytic anemia: Hemoglobin 11.7 g/dL prior to discharge.  Baseline ranges from 10-12 g/dL.  No reports of bleeding noted.  6.  Hypoxia:  Resolved.  Thought secondary to pain medications  Follow-up  Contact information for after-discharge care    [facility] .   Service:  Skilled Nursing Contact information: [place] [phone]               Consults obtained: Physical therapy and Occupational Therapy  Discharge Condition:  Stable  Diet and Activity recommendation: See Discharge Instructions below  Discharge Instructions    Discharge instructions   Complete by:  As directed    Follow with Primary MD Gaynelle Arabian, MD in 7 days.  You are found to have a pubic ramus fracture.  Dr. Durward Fortes of orthopedics recommending weightbearing as tolerated.  Follow-up with orthopedics as needed.  Get CBC and BMP-  checked  by Primary MD or SNF MD in 5-7 days ( we routinely change or add medications that can affect your baseline labs and fluid status, therefore we recommend that you get the mentioned basic workup next visit with your PCP, your PCP may decide not to get them or add new tests based on their clinical decision)  Activity: As tolerated with fall precautions use walker as seen below  Disposition: SNF  Diet: Heart healthy  Equipment recommendations:Rolling walker with 5" wheels;3in1 (PT);Other (comment)(Youth-sized) Diet: Heart Healthy   On your next visit with your primary care physician please Get Medicines reviewed and adjusted.  Please request your Gaynelle Arabian, MD to go over all Hospital Tests and Procedure/Radiological results at the follow up, please get all Hospital records sent to your Prim MD by signing hospital release before you go home.  If you experience worsening of your admission symptoms, develop shortness of breath, life threatening emergency, suicidal or homicidal thoughts you must seek medical attention immediately by calling 911 or calling your MD immediately  if symptoms less severe.  You  Must read complete instructions/literature along with all the possible adverse reactions/side effects for all the Medicines you take and that have been prescribed to you. Take any new Medicines after you have completely understood and accpet all the possible adverse reactions/side effects.   Do not drive, operate heavy machinery, perform activities at heights, swimming or participation in water activities or provide baby sitting services if your were admitted for syncope or siezures until you have seen by Primary MD or a Neurologist and advised to do so again.  Do not drive when taking Pain medications.  Do not take more than prescribed Pain, Sleep and Anxiety Medications  Wear Seat belts while driving.   Please note  You were cared for by a hospitalist during your hospital stay.  If you have any questions about your discharge medications or the care you received while you were in the hospital after you are discharged, you can call the unit and asked to speak with the hospitalist on call if the hospitalist that took care of you is not available. Once you are discharged, your primary care physician will handle any further medical issues. Please note that NO REFILLS for any discharge medications will be authorized once you are discharged, as it is imperative that you return to your primary care physician (or establish a relationship with a primary care physician if you do not have one) for your aftercare needs so that they can reassess your need for medications and monitor your lab values.   Increase activity slowly   Complete by:  As directed         Discharge Medications     Allergies as of 01/04/2019      Reactions   Dicloxacillin Other (See Comments)   Unknown severe allergic reaction   Other Other (See Comments)   Unknown reaction to hazel nuts   Pork-derived Products Other (See Comments)   Pt does not eat pork   Septra [sulfamethoxazole-trimethoprim] Nausea And Vomiting        Medication List    TAKE these medications   acetaminophen 650 MG CR tablet Commonly known as:  TYLENOL Take 650 mg by mouth every 8 (eight) hours as needed for pain.   amLODipine 2.5 MG tablet Commonly known as:  NORVASC Take 2.5 mg by mouth daily.   Caltrate 600+D 600-400 MG-UNIT tablet Generic drug:  Calcium Carbonate-Vitamin D Take 1 tablet by mouth every evening.   ciprofloxacin 250 MG tablet Commonly known as:  CIPRO Take 1 tablet (250 mg total) by mouth daily for 6 days. What changed:  when to take this   HYDROcodone-acetaminophen 5-325 MG tablet Commonly known as:  NORCO/VICODIN Take 1-2 tablets by mouth every 6 (six) hours as needed for moderate pain.   multivitamin with minerals Tabs tablet Take 1 tablet by mouth daily.       Major procedures and Radiology Reports - PLEASE review detailed and final reports for all details, in brief -    Dg Chest Port 1 View  Result Date: 01/02/2019 CLINICAL DATA:  Hypoxia. EXAM: PORTABLE CHEST 1 VIEW COMPARISON:  Chest radiograph and CT 09/16/2010 FINDINGS: Patient is rotated. Low lung volumes. Vague bibasilar opacities favoring atelectasis. Biapical pleuroparenchymal scarring as before. Heart is normal in size for technique. Atherosclerosis of the thoracic aorta. No pulmonary edema, confluent airspace disease, pleural effusion or pneumothorax. Remote bilateral rib fractures. IMPRESSION: 1. Low lung volumes with bibasilar opacities favoring atelectasis. 2.  Aortic Atherosclerosis (ICD10-I70.0). Electronically Signed   By: Keith Rake M.D.   On: 01/02/2019 21:37   Dg Hip Unilat W Or Wo Pelvis 2-3 Views Left  Result Date: 01/02/2019 CLINICAL DATA:  85 year old with osteoporosis. Fall with left pelvic and left hip pain. EXAM: DG HIP (WITH OR WITHOUT PELVIS) 2-3V LEFT COMPARISON:  Pelvic radiograph 02/01/2014 FINDINGS: Slight cortical irregularity of the left superior pubic ramus, not definitively seen on prior exam. This area is  not as well assessed on the frogleg lateral view. Lateral plate and trans trochanteric screw fixation of the left proximal femur. Right hip arthroplasty. Pubic symphysis and sacroiliac joints are congruent. Degenerative change of the sacroiliac joints. Diffuse bony under mineralization. There are vascular calcifications. IMPRESSION: 1. Slight cortical irregularity of the left superior pubic ramus, suspicious for nondisplaced fracture. CT  could be considered for further evaluation, however orthopedic hardware may cause significant streak artifact and limit assessment. 2. Intact right hip arthroplasty were visualized. Intact left hip ORIF hardware. Electronically Signed   By: Keith Rake M.D.   On: 01/02/2019 19:37    Micro Results    Recent Results (from the past 240 hour(s))  Urine culture     Status: None (Preliminary result)   Collection Time: 01/02/19  8:37 PM  Result Value Ref Range Status   Specimen Description URINE, RANDOM  Final   Special Requests NONE  Final   Culture   Final    CULTURE REINCUBATED FOR BETTER GROWTH Performed at [facility], [place] [address], [place], [place] [address]    Report Status PENDING  Incomplete       Today   Subjective    Nicholette Dolson today states that she has had no problems overnight.  Patient reports that she is doing well and only has pain when trying to get up and move around. Objective   Blood pressure (!) 152/75, pulse 73, temperature 98.5 F (36.9 C), temperature source Oral, resp. rate 16, height 5' (1.524 m), weight 44.9 kg, SpO2 91 %.   Intake/Output Summary (Last 24 hours) at 01/04/2019 1149 Last data filed at 01/04/2019 0850 Gross per 24 hour  Intake 500 ml  Output 400 ml  Net 100 ml    Exam  Constitutional: Frail elderly female in NAD, calm, comfortable Eyes: PERRL, lids and conjunctivae normal ENMT: Mucous membranes are moist. Posterior pharynx clear of any exudate or lesions. Very hard of hearing.  No hearing  aids in place. Neck: normal, supple, no masses, no thyromegaly Respiratory: clear to auscultation bilaterally, no wheezing, no crackles. Normal respiratory effort. No accessory muscle use.  Cardiovascular: Regular rate and rhythm, positive systolic ejection murmur 1/6. No extremity edema. 2+ pedal pulses. No carotid bruits.  Abdomen: no tenderness, no masses palpated. No hepatosplenomegaly. Bowel sounds positive.  Musculoskeletal: no clubbing / cyanosis.  Decreased overall range of motion due to pain.  Skin: no rashes, lesions, ulcers. No induration Neurologic: CN 2-12 grossly intact. Sensation intact, DTR normal. Strength 4+/5 in all 4.  Psychiatric: Normal judgment and insight. Alert and oriented x 3. Normal mood.    Data Review   CBC w Diff:  Lab Results  Component Value Date   WBC 12.6 (H) 01/03/2019   HGB 11.7 (L) 01/03/2019   HCT 35.7 (L) 01/03/2019   PLT 279 01/03/2019   LYMPHOPCT 10 01/02/2019   MONOPCT 5 01/02/2019   EOSPCT 0 01/02/2019   BASOPCT 0 01/02/2019    CMP:  Lab Results  Component Value Date   NA 137 01/03/2019   K 3.6 01/03/2019   CL 106 01/03/2019   CO2 22 01/03/2019   BUN 19 01/03/2019   CREATININE 0.93 01/03/2019   PROT 6.8 01/02/2019   ALBUMIN 3.6 01/02/2019   ALBUMIN 3.6 01/02/2019   BILITOT 0.7 01/02/2019   ALKPHOS 62 01/02/2019   AST 22 01/02/2019   ALT 17 01/02/2019  .   Total Time in preparing paper work, data evaluation and todays exam - 35 minutes  Norval Morton M.D on 01/04/2019 at 11:49 AM  [facility]   Office  [phone]

## 2019-01-04 NOTE — Progress Notes (Signed)
Report and updated temperature given to Lenna Sciara, Midwife, at [facility] and [place] center. Was informed pf pt DNR status. AVS, discharge summary, and golden rod in discharge packet. Awaiting EMS arrival for transport.

## 2019-01-04 NOTE — Progress Notes (Signed)
EMS arrived to transport pt. Given oral and written report including prescription for narcotics. Assisted pt to stretcher with stand pivot technique. Pt has both hearing aides in her ears. Attempted to contact  Ilda Mori via telephone and received voicemail at each number. Left non-detailed message to return call to this Probation officer.

## 2019-01-04 NOTE — TOC Transition Note (Signed)
Transition of Care [facility]) - CM/SW Discharge Note   Patient Details  Name: [name] MRN: [id number] Date of Birth: 1918-12-15  Transition of [facility]) CM/SW Contact:  Ninfa Meeker, RN Phone Number: 01/04/2019, 1:52 PM   Clinical Narrative:   85 yr old female being observed for non operative pubic rami fracture. Also received treatment for UTI. Patient will discharge to [facility] for shortterm rehab. CM has spoken with patient's daughter concerning transport.    Barriers to Discharge: No Barriers Identified   Patient Goals and CMS Choice Patient states their goals for this hospitalization and ongoing recovery are:: to go to rehab then go hom to [place] CMS Medicare.gov Compare Post Acute Care list provided to:: Patient Represenative (must comment)([name] (daughter)) Choice offered to / list presented to : Adult Children  Discharge Placement                       Discharge Plan and Services   Discharge Planning Services: NA Post Acute Care Choice: Skilled Nursing Facility          DME Arranged: N/A DME Agency: NA HH Arranged: NA HH Agency: NA   Social Determinants of Health (SDOH) Interventions     Readmission Risk Interventions No flowsheet data found.

## 2019-01-04 NOTE — Discharge Summary (Addendum)
[name], is a 85 y.o. female  DOB 01-16-1919  MRN [id number].  Admission date:  01/02/2019  Admitting Physician  Toy Baker, MD  Discharge Date:  01/04/2019   Primary MD  Gaynelle Arabian, MD  Recommendations for primary care physician for things to follow:   -Follow-up on urine culture sensitive  Discharge Diagnosis   Principal Problem:   Pubic ramus fracture [facility]) Active Problems:   Frequent falls   Acute lower UTI   Leukocytosis   Hip pain   Essential hypertension   Normocytic anemia      Past Medical History:  Diagnosis Date   Cancer of the skin, basal cell 2008   FACE   Degenerative joint disease (DJD) of hip    Depression    Gait disorder    Hypertension    Hypokalemia    Iron deficiency anemia    Osteoporosis    Pelvic fracture [facility])     Past Surgical History:  Procedure Laterality Date   CATARACT EXTRACTION, BILATERAL     HIP FRACTURE SURGERY  09/2004   DR. WHITFIELD   NERVE ENTRAPMENT     LEFT THUMB   SPINAL FUSION  1996   L3-4 [facility]   WRIST FRACTURE SURGERY     DR. Daylene Katayama       HPI  from the history and physical done on the day of admission:  [name] is a 85 y.o. female with medical history significant of HTN, frequent falls, frequent UTI's, small subdural hematoma in 2018, bilateral hip replacemetns    Presented with patient had a mechanical fall today and since then have had trouble ambulating. She walks regularly with a walker.  Family thinks she could have fallen twice.  She is has history of frequent falls especially when she has a UTI she denies currently dysuria no fevers or chills no cough no shortness of breath no travel history.  She has severe pain in her pelvis with ambulation at her baseline she walks with a walker but has  not been able to ambulate since the fall. Was no associated chest pain abdominal pain no headaches no shortness of breath.  Patient is a family friend of Dr. Donne Hazel who felt that she would benefit from further evaluation patient was brought in to [facility] emergency department.  Regarding pertinent Chronic problems:  Hx Of hypertension for which she takes amlodipine  While in ER: Plain film showing evidence of pelvic fracture superior ramus. ER provider spoke with Dr. Garnette Czech with orthopedics Commence admission for PT OT evaluation and pain management She did noticed to have some leukocytosis on arrival which thought to be secondary to stress demargination. The following Work up has been ordered so far:   Hospital Course:    1.  Pubic ramus fracture 2/2 Fall: Acute.  Secondary to mechanical fall.At baseline patient has gait disturbance and needs walker. Family reports some falls usually associated with UTI. No s/sx infection, no metabolic derangement. Dr. Durward Fortes orthopedic recommends weightbearing as tolerated.  Hydrocodone given as needed for pain.  Physical therapy recommending skilled nursing facility placement.  2. Essential hypertension: Fair control. Home meds includeNorvasc which was continued.  3.Leukocytosis: Acute.  WBC trending down 17.4 -> 12.6.  In the setting of a fall and fracture elevation most likely due to stress demargination.  Max temp 99.3. chest xray with atelectasis. Oxygen saturation level greater than 90% on room air.  Patient was provided with incentive spirometry.  4.  Urinary Tract Infection, history of recurrent UTI: Acute. Urinalysis was positive for > 50 WBCs, but no bacteria seen.  Urine culture sent and pending re-incubation resulted >100,000colonies/ml Viridans Streptococcus .  Patient was started on ciprofloxacin 250 mg daily, but changed to Keflex 250mg  TID to complete 5 day course after talks with pharmacy.  5.  Normocytic anemia:  Hemoglobin 11.7 g/dL prior to discharge.  Baseline ranges from 10-12 g/dL.  No reports of bleeding noted.  6.  Hypoxia: Resolved.  Thought secondary to pain medications  Follow-up  Contact information for after-discharge care    [facility] .   Service:  Skilled Nursing Contact information: [place] [phone]               Consults obtained: Physical therapy and Occupational Therapy  Discharge Condition:  Stable  Diet and Activity recommendation: See Discharge Instructions below  Discharge Instructions    Discharge instructions   Complete by:  As directed    Follow with Primary MD Gaynelle Arabian, MD in 7 days.  You are found to have a pubic ramus fracture.  Dr. Durward Fortes of orthopedics recommending weightbearing as tolerated.  Follow-up with orthopedics as needed.  Get CBC and BMP-  checked  by Primary MD or SNF MD in 5-7 days ( we routinely change or add medications that can affect your baseline labs and fluid status, therefore we recommend that you get the mentioned basic workup next visit with your PCP, your PCP may decide not to get them or add new tests based on their clinical decision)  Activity: As tolerated with fall precautions use walker as seen below  Disposition: SNF  Diet: Heart healthy  Equipment recommendations:Rolling walker with 5" wheels;3in1 (PT);Other (comment)(Youth-sized) Diet: Heart Healthy   On your next visit with your primary care physician please Get Medicines reviewed and adjusted.  Please request your Gaynelle Arabian, MD to go over all Hospital Tests and Procedure/Radiological results at the follow up, please get all Hospital records sent to your Prim MD by signing hospital release before you go home.  If you experience worsening of your admission symptoms, develop shortness of breath, life threatening emergency, suicidal or homicidal thoughts  you must seek medical attention immediately by calling 911 or calling your MD immediately  if symptoms less severe.  You Must read complete instructions/literature along with all the possible adverse reactions/side effects for all the Medicines you take and that have been prescribed to you. Take any new Medicines after you have completely understood and accpet all the possible adverse reactions/side effects.   Do not drive, operate heavy machinery, perform activities at heights, swimming or participation in water activities or provide baby sitting services if your were admitted for syncope or siezures until you have seen by Primary MD or a Neurologist and advised to do so again.  Do not drive when taking Pain medications.  Do not take more than prescribed Pain, Sleep and Anxiety Medications  Wear Seat belts  while driving.   Please note  You were cared for by a hospitalist during your hospital stay. If you have any questions about your discharge medications or the care you received while you were in the hospital after you are discharged, you can call the unit and asked to speak with the hospitalist on call if the hospitalist that took care of you is not available. Once you are discharged, your primary care physician will handle any further medical issues. Please note that NO REFILLS for any discharge medications will be authorized once you are discharged, as it is imperative that you return to your primary care physician (or establish a relationship with a primary care physician if you do not have one) for your aftercare needs so that they can reassess your need for medications and monitor your lab values.   Increase activity slowly   Complete by:  As directed         Discharge Medications     Allergies as of 01/04/2019      Reactions   Dicloxacillin Other (See Comments)   Unknown severe allergic reaction   Other Other (See Comments)   Unknown reaction to hazel nuts   Pork-derived Products  Other (See Comments)   Pt does not eat pork   Septra [sulfamethoxazole-trimethoprim] Nausea And Vomiting      Medication List    TAKE these medications   acetaminophen 650 MG CR tablet Commonly known as:  TYLENOL Take 650 mg by mouth every 8 (eight) hours as needed for pain.   amLODipine 2.5 MG tablet Commonly known as:  NORVASC Take 2.5 mg by mouth daily.   Caltrate 600+D 600-400 MG-UNIT tablet Generic drug:  Calcium Carbonate-Vitamin D Take 1 tablet by mouth every evening.   ciprofloxacin 250 MG tablet Commonly known as:  CIPRO Take 1 tablet (250 mg total) by mouth daily for 6 days. What changed:  when to take this   HYDROcodone-acetaminophen 5-325 MG tablet Commonly known as:  NORCO/VICODIN Take 1-2 tablets by mouth every 6 (six) hours as needed for moderate pain.   multivitamin with minerals Tabs tablet Take 1 tablet by mouth daily.       Major procedures and Radiology Reports - PLEASE review detailed and final reports for all details, in brief -    Dg Chest Port 1 View  Result Date: 01/02/2019 CLINICAL DATA:  Hypoxia. EXAM: PORTABLE CHEST 1 VIEW COMPARISON:  Chest radiograph and CT 09/16/2010 FINDINGS: Patient is rotated. Low lung volumes. Vague bibasilar opacities favoring atelectasis. Biapical pleuroparenchymal scarring as before. Heart is normal in size for technique. Atherosclerosis of the thoracic aorta. No pulmonary edema, confluent airspace disease, pleural effusion or pneumothorax. Remote bilateral rib fractures. IMPRESSION: 1. Low lung volumes with bibasilar opacities favoring atelectasis. 2.  Aortic Atherosclerosis (ICD10-I70.0). Electronically Signed   By: Keith Rake M.D.   On: 01/02/2019 21:37   Dg Hip Unilat W Or Wo Pelvis 2-3 Views Left  Result Date: 01/02/2019 CLINICAL DATA:  85 year old with osteoporosis. Fall with left pelvic and left hip pain. EXAM: DG HIP (WITH OR WITHOUT PELVIS) 2-3V LEFT COMPARISON:  Pelvic radiograph 02/01/2014 FINDINGS:  Slight cortical irregularity of the left superior pubic ramus, not definitively seen on prior exam. This area is not as well assessed on the frogleg lateral view. Lateral plate and trans trochanteric screw fixation of the left proximal femur. Right hip arthroplasty. Pubic symphysis and sacroiliac joints are congruent. Degenerative change of the sacroiliac joints. Diffuse bony under mineralization. There are vascular  calcifications. IMPRESSION: 1. Slight cortical irregularity of the left superior pubic ramus, suspicious for nondisplaced fracture. CT could be considered for further evaluation, however orthopedic hardware may cause significant streak artifact and limit assessment. 2. Intact right hip arthroplasty were visualized. Intact left hip ORIF hardware. Electronically Signed   By: Keith Rake M.D.   On: 01/02/2019 19:37    Micro Results    Recent Results (from the past 240 hour(s))  Urine culture     Status: None (Preliminary result)   Collection Time: 01/02/19  8:37 PM  Result Value Ref Range Status   Specimen Description URINE, RANDOM  Final   Special Requests NONE  Final   Culture   Final    CULTURE REINCUBATED FOR BETTER GROWTH Performed at [facility], [place] [address], [place], [place] [address]    Report Status PENDING  Incomplete       Today   Subjective    [name] today states that she has had no problems overnight.  Patient reports that she is doing well and only has pain when trying to get up and move around. Objective   Blood pressure (!) 152/75, pulse 73, temperature 98.5 F (36.9 C), temperature source Oral, resp. rate 16, height 5' (1.524 m), weight 44.9 kg, SpO2 91 %.   Intake/Output Summary (Last 24 hours) at 01/04/2019 1201 Last data filed at 01/04/2019 0850 Gross per 24 hour  Intake 500 ml  Output 400 ml  Net 100 ml    Exam  Constitutional: Frail elderly female in NAD, calm, comfortable Eyes: PERRL, lids and conjunctivae normal ENMT:  Mucous membranes are moist. Posterior pharynx clear of any exudate or lesions. Very hard of hearing.  No hearing aids in place. Neck: normal, supple, no masses, no thyromegaly Respiratory: clear to auscultation bilaterally, no wheezing, no crackles. Normal respiratory effort. No accessory muscle use.  Cardiovascular: Regular rate and rhythm, positive systolic ejection murmur 1/6. No extremity edema. 2+ pedal pulses. No carotid bruits.  Abdomen: no tenderness, no masses palpated. No hepatosplenomegaly. Bowel sounds positive.  Musculoskeletal: no clubbing / cyanosis.  Decreased overall range of motion due to pain.  Skin: no rashes, lesions, ulcers. No induration Neurologic: CN 2-12 grossly intact. Sensation intact, DTR normal. Strength 4+/5 in all 4.  Psychiatric: Normal judgment and insight. Alert and oriented x 3. Normal mood.    Data Review   CBC w Diff:  Lab Results  Component Value Date   WBC 12.6 (H) 01/03/2019   HGB 11.7 (L) 01/03/2019   HCT 35.7 (L) 01/03/2019   PLT 279 01/03/2019   LYMPHOPCT 10 01/02/2019   MONOPCT 5 01/02/2019   EOSPCT 0 01/02/2019   BASOPCT 0 01/02/2019    CMP:  Lab Results  Component Value Date   NA 137 01/03/2019   K 3.6 01/03/2019   CL 106 01/03/2019   CO2 22 01/03/2019   BUN 19 01/03/2019   CREATININE 0.93 01/03/2019   PROT 6.8 01/02/2019   ALBUMIN 3.6 01/02/2019   ALBUMIN 3.6 01/02/2019   BILITOT 0.7 01/02/2019   ALKPHOS 62 01/02/2019   AST 22 01/02/2019   ALT 17 01/02/2019  .   Total Time in preparing paper work, data evaluation and todays exam - 35 minutes  Norval Morton M.D on 01/04/2019 at [facility]  [phone]

## 2019-01-04 NOTE — Treatment Plan (Signed)
Physical Therapy Treatment Patient Details Name: [name] MRN: [id number] DOB: Jun 07, 1919 Today's Date: 01/04/2019    History of Present Illness Admitted after fall resulting in L superior pubic ramus fracture; fracture is stable; per Ortho can mobilize, WBAT; gait disturbance with RW use full-time prior to this admission;  has a past medical history of Cancer of the skin, basal cell (2008), Degenerative joint disease (DJD) of hip, Depression, Gait disorder, Hypertension, Hypokalemia, Iron deficiency anemia, Osteoporosis, and Pelvic fracture ([place]).    PT Comments    She was overall min to mod A with transfers and mobility today with good tolerance to session. PT treatment plan still appropriate and PT still recommending discharge to SNF.    Follow Up Recommendations  SNF     Equipment Recommendations  Rolling walker with 5" wheels;3in1 (PT);Other (comment)    Recommendations for Other Services       Precautions / Restrictions Precautions Precautions: Fall Restrictions RLE Weight Bearing: Weight bearing as tolerated LLE Weight Bearing: Weight bearing as tolerated    Mobility  Bed Mobility Overal bed mobility: Needs Assistance Bed Mobility: Supine to Sit     Supine to sit: Mod assist        Transfers Overall transfer level: Needs assistance Equipment used: Rolling walker (2 wheeled) Transfers: Sit to/from [facility] Sit to Stand: Min assist(X3) Stand pivot transfers: Min assist          Ambulation/Gait Ambulation/Gait assistance: Min assist Gait Distance (Feet): 3 Feet Assistive device: Rolling walker (2 wheeled) Gait Pattern/deviations: Decreased step length - right;Decreased step length - left Gait velocity: slower Gait velocity interpretation: <1.31 ft/sec, indicative of household ambulator General Gait Details: Good use of RW to unweigh painful LLE; used a youth-sized RW for proper fit          Balance     Sitting  balance-Leahy Scale: Fair       Standing balance-Leahy Scale: Poor Standing balance comment: needs UE support and RW                            Cognition Arousal/Alertness: Awake/alert Behavior During Therapy: WFL for tasks assessed/performed Overall Cognitive Status: No family/caregiver present to determine baseline cognitive functioning                                               Pertinent Vitals/Pain Pain Assessment: Faces Faces Pain Scale: Hurts a little bit Pain Location: L hip/groin Pain Descriptors / Indicators: Aching Pain Intervention(s): Limited activity within patient's tolerance;Monitored during session;Repositioned           PT Goals (current goals can now be found in the care plan section) Acute Rehab PT Goals Patient Stated Goal: did not specifically state, but agreeable to getting OOB to recliner Progress towards PT goals: Progressing toward goals    Frequency    Min 2X/week      PT Plan Current plan remains appropriate       AM-PAC PT "6 Clicks" Mobility   Outcome Measure  Help needed turning from your back to your side while in a flat bed without using bedrails?: A Little Help needed moving from lying on your back to sitting on the side of a flat bed without using bedrails?: A Lot Help needed moving to and from a bed to a  chair (including a wheelchair)?: A Lot Help needed standing up from a chair using your arms (e.g., wheelchair or bedside chair)?: A Little Help needed to walk in hospital room?: A Little Help needed climbing 3-5 steps with a railing? : A Lot 6 Click Score: 15    End of Session Equipment Utilized During Treatment: Gait belt Activity Tolerance: Patient tolerated treatment well Patient left: in chair;with call bell/phone within reach;with chair alarm set Nurse Communication: Mobility status PT Visit Diagnosis: Unsteadiness on feet (R26.81);Other abnormalities of gait and mobility  (R26.89);History of falling (Z91.81)     Time: 9826-4158 PT Time Calculation (min) (ACUTE ONLY): 16 min  Charges:  $Therapeutic Activity: 8-22 mins                     Elsie Ra, PT,DPT Acute Rehabilitation Services Office [phone]    Debbe Odea 01/04/2019, 12:36 PM

## 2019-01-04 NOTE — Care Management (Signed)
Case manager spoke with Roselyn Reef at [facility], they will expect patient today. Bedside RN to call report to Nurse at [phone]. CM spoke with patient's daughter, [name] and Son-in-law Dr. Alfred Levins. They requested that they be notified when patient will be transported and requested that nursing be sure sh has  her hearing aides, and to assure patient that they love her and are with her in spirit. CM asked bedside RN to relay message. Patient's hearing aides are in per Nurse. [facility] called to arrange transport.  CM notified patient's daughter and son-in-law that transportation has been scheduled.

## 2019-01-05 DIAGNOSIS — I10 Essential (primary) hypertension: Secondary | ICD-10-CM | POA: Diagnosis not present

## 2019-01-05 DIAGNOSIS — N390 Urinary tract infection, site not specified: Secondary | ICD-10-CM | POA: Diagnosis not present

## 2019-01-05 DIAGNOSIS — S32592D Other specified fracture of left pubis, subsequent encounter for fracture with routine healing: Secondary | ICD-10-CM | POA: Diagnosis not present

## 2019-01-05 DIAGNOSIS — W19XXXD Unspecified fall, subsequent encounter: Secondary | ICD-10-CM | POA: Diagnosis not present

## 2019-01-08 DIAGNOSIS — F0390 Unspecified dementia without behavioral disturbance: Secondary | ICD-10-CM | POA: Diagnosis not present

## 2019-01-08 DIAGNOSIS — S329XXA Fracture of unspecified parts of lumbosacral spine and pelvis, initial encounter for closed fracture: Secondary | ICD-10-CM | POA: Diagnosis not present

## 2019-01-08 DIAGNOSIS — F322 Major depressive disorder, single episode, severe without psychotic features: Secondary | ICD-10-CM | POA: Diagnosis not present

## 2019-01-08 DIAGNOSIS — N390 Urinary tract infection, site not specified: Secondary | ICD-10-CM | POA: Diagnosis not present

## 2019-01-08 DIAGNOSIS — R269 Unspecified abnormalities of gait and mobility: Secondary | ICD-10-CM | POA: Diagnosis not present

## 2019-01-08 DIAGNOSIS — I10 Essential (primary) hypertension: Secondary | ICD-10-CM | POA: Diagnosis not present

## 2019-01-12 DIAGNOSIS — I10 Essential (primary) hypertension: Secondary | ICD-10-CM | POA: Diagnosis not present

## 2019-01-12 DIAGNOSIS — S32592D Other specified fracture of left pubis, subsequent encounter for fracture with routine healing: Secondary | ICD-10-CM | POA: Diagnosis not present

## 2019-01-12 DIAGNOSIS — N390 Urinary tract infection, site not specified: Secondary | ICD-10-CM | POA: Diagnosis not present

## 2019-01-12 DIAGNOSIS — D6489 Other specified anemias: Secondary | ICD-10-CM | POA: Diagnosis not present

## 2019-01-19 DIAGNOSIS — D6489 Other specified anemias: Secondary | ICD-10-CM | POA: Diagnosis not present

## 2019-01-19 DIAGNOSIS — N390 Urinary tract infection, site not specified: Secondary | ICD-10-CM | POA: Diagnosis not present

## 2019-01-19 DIAGNOSIS — S32592D Other specified fracture of left pubis, subsequent encounter for fracture with routine healing: Secondary | ICD-10-CM | POA: Diagnosis not present

## 2019-01-19 DIAGNOSIS — I10 Essential (primary) hypertension: Secondary | ICD-10-CM | POA: Diagnosis not present

## 2019-01-21 DIAGNOSIS — S32592D Other specified fracture of left pubis, subsequent encounter for fracture with routine healing: Secondary | ICD-10-CM | POA: Diagnosis not present

## 2019-01-21 DIAGNOSIS — D6489 Other specified anemias: Secondary | ICD-10-CM | POA: Diagnosis not present

## 2019-01-21 DIAGNOSIS — N390 Urinary tract infection, site not specified: Secondary | ICD-10-CM | POA: Diagnosis not present

## 2019-01-21 DIAGNOSIS — I10 Essential (primary) hypertension: Secondary | ICD-10-CM | POA: Diagnosis not present

## 2019-01-23 DIAGNOSIS — I10 Essential (primary) hypertension: Secondary | ICD-10-CM | POA: Diagnosis not present

## 2019-01-23 DIAGNOSIS — W19XXXD Unspecified fall, subsequent encounter: Secondary | ICD-10-CM | POA: Diagnosis not present

## 2019-01-23 DIAGNOSIS — S32592D Other specified fracture of left pubis, subsequent encounter for fracture with routine healing: Secondary | ICD-10-CM | POA: Diagnosis not present

## 2019-01-27 ENCOUNTER — Other Ambulatory Visit: Payer: Self-pay | Admitting: *Deleted

## 2019-01-27 DIAGNOSIS — S32592D Other specified fracture of left pubis, subsequent encounter for fracture with routine healing: Secondary | ICD-10-CM | POA: Diagnosis not present

## 2019-01-27 DIAGNOSIS — W19XXXD Unspecified fall, subsequent encounter: Secondary | ICD-10-CM | POA: Diagnosis not present

## 2019-01-27 DIAGNOSIS — I10 Essential (primary) hypertension: Secondary | ICD-10-CM | POA: Diagnosis not present

## 2019-01-27 DIAGNOSIS — D6489 Other specified anemias: Secondary | ICD-10-CM | POA: Diagnosis not present

## 2019-01-27 NOTE — Patient Outreach (Addendum)
[facility]) Care Management  01/27/2019  YUKIE BERGERON [birth year] [id number]   Collaboration with [facility]. Member remains at  [facility] for rehab. Mrs. Shedrick is slated to discharge on Saturday. Member is supposed to return to [facility] upon discharge from SNF. Discussed that writer will reach out to member's daughter to discuss potential [facility] Care Management needs.  Daughter Ilda Mori is listed as emergency contact in system. Contacted [name] on her cell number at [phone] to discuss [facility]  Management program.  [name] states her mother is supposed to return to [facility] on Saturday with home health and additional caregiver support thru [facility]. States member will have additional help with meals and with ADLs thru [facility] private duty agency. [name] states she has been in contact with the [facility] social worker regarding discharge plans and transportation upon SNF discharge.   Explained [facility] Management program. [name] agreed that there are no current identifiable [facility] Care Management needs.  However, [name] states she will not able to pick member up from SNF on Saturday because her husband has health issues.  [name] inquired about other transportation agencies if [facility] discharge planner is unable to arrange. After consulting with [facility] social worker, provided daughter with [facility] transportation name and number to patient's daughter if needed.   Provided [name] with writer's contact information as well as [facility] Management contact information should [facility] Care Management is needed.   [name] expressed appreciation of writers call.  Plan to sign off at this time.   Marthenia Rolling, MSN-Ed, RN,BSN [facility] Acute Care Coordinator [phone]

## 2019-02-01 ENCOUNTER — Other Ambulatory Visit: Payer: Self-pay

## 2019-02-01 DIAGNOSIS — Z87440 Personal history of urinary (tract) infections: Secondary | ICD-10-CM | POA: Diagnosis not present

## 2019-02-01 DIAGNOSIS — Z96643 Presence of artificial hip joint, bilateral: Secondary | ICD-10-CM | POA: Diagnosis not present

## 2019-02-01 DIAGNOSIS — S3289XD Fracture of other parts of pelvis, subsequent encounter for fracture with routine healing: Secondary | ICD-10-CM | POA: Diagnosis not present

## 2019-02-01 DIAGNOSIS — Z9181 History of falling: Secondary | ICD-10-CM | POA: Diagnosis not present

## 2019-02-01 DIAGNOSIS — Z85828 Personal history of other malignant neoplasm of skin: Secondary | ICD-10-CM | POA: Diagnosis not present

## 2019-02-01 DIAGNOSIS — D509 Iron deficiency anemia, unspecified: Secondary | ICD-10-CM | POA: Diagnosis not present

## 2019-02-01 DIAGNOSIS — I700 Atherosclerosis of aorta: Secondary | ICD-10-CM | POA: Diagnosis not present

## 2019-02-01 DIAGNOSIS — F329 Major depressive disorder, single episode, unspecified: Secondary | ICD-10-CM | POA: Diagnosis not present

## 2019-02-01 DIAGNOSIS — Z981 Arthrodesis status: Secondary | ICD-10-CM | POA: Diagnosis not present

## 2019-02-01 DIAGNOSIS — M80052D Age-related osteoporosis with current pathological fracture, left femur, subsequent encounter for fracture with routine healing: Secondary | ICD-10-CM | POA: Diagnosis not present

## 2019-02-01 DIAGNOSIS — M519 Unspecified thoracic, thoracolumbar and lumbosacral intervertebral disc disorder: Secondary | ICD-10-CM | POA: Diagnosis not present

## 2019-02-01 DIAGNOSIS — I10 Essential (primary) hypertension: Secondary | ICD-10-CM | POA: Diagnosis not present

## 2019-02-01 DIAGNOSIS — H9190 Unspecified hearing loss, unspecified ear: Secondary | ICD-10-CM | POA: Diagnosis not present

## 2019-02-01 NOTE — Patient Outreach (Signed)
[facility]) Care Management  02/01/2019  [name] 12-17-1918 [id number]    EMMI-General Discharge RED ON EMMI ALERT Day # 1 Date: 01/30/2019 Red Alert Reason: "Got discharge papers? I don't know  Scheduled follow up? No"   Outreach attempt # 1 to patient. Spoke with patient's son in law who voices that patient does not resides them. She is a resident at [facility]. He is unaware/unsure in regards to answering of automated calls. Advised to call and speak with patient's daughter/his wife. RN CM contacted daughter-spoke briefly as she was at MD appt. She voices that patient is doing well. No acute needs or concerns. RN CM will have automated post discharge calls discontinued since patient is at facility and not able to answer calls.       Plan: RN CM will close case at this time.  RN CM will notify [facility] administrative assistant to deactivate automated calls.  Enzo Montgomery, RN,BSN,CCM [place] Management Telephonic Care Management Coordinator Direct Phone: [phone] Toll Free: [phone] Fax: [phone]

## 2019-02-02 DIAGNOSIS — D509 Iron deficiency anemia, unspecified: Secondary | ICD-10-CM | POA: Diagnosis not present

## 2019-02-02 DIAGNOSIS — M519 Unspecified thoracic, thoracolumbar and lumbosacral intervertebral disc disorder: Secondary | ICD-10-CM | POA: Diagnosis not present

## 2019-02-02 DIAGNOSIS — M80052D Age-related osteoporosis with current pathological fracture, left femur, subsequent encounter for fracture with routine healing: Secondary | ICD-10-CM | POA: Diagnosis not present

## 2019-02-02 DIAGNOSIS — I700 Atherosclerosis of aorta: Secondary | ICD-10-CM | POA: Diagnosis not present

## 2019-02-02 DIAGNOSIS — F329 Major depressive disorder, single episode, unspecified: Secondary | ICD-10-CM | POA: Diagnosis not present

## 2019-02-02 DIAGNOSIS — I10 Essential (primary) hypertension: Secondary | ICD-10-CM | POA: Diagnosis not present

## 2019-02-05 DIAGNOSIS — D509 Iron deficiency anemia, unspecified: Secondary | ICD-10-CM | POA: Diagnosis not present

## 2019-02-05 DIAGNOSIS — I700 Atherosclerosis of aorta: Secondary | ICD-10-CM | POA: Diagnosis not present

## 2019-02-05 DIAGNOSIS — M519 Unspecified thoracic, thoracolumbar and lumbosacral intervertebral disc disorder: Secondary | ICD-10-CM | POA: Diagnosis not present

## 2019-02-05 DIAGNOSIS — M80052D Age-related osteoporosis with current pathological fracture, left femur, subsequent encounter for fracture with routine healing: Secondary | ICD-10-CM | POA: Diagnosis not present

## 2019-02-05 DIAGNOSIS — I10 Essential (primary) hypertension: Secondary | ICD-10-CM | POA: Diagnosis not present

## 2019-02-05 DIAGNOSIS — F329 Major depressive disorder, single episode, unspecified: Secondary | ICD-10-CM | POA: Diagnosis not present

## 2019-02-09 DIAGNOSIS — M519 Unspecified thoracic, thoracolumbar and lumbosacral intervertebral disc disorder: Secondary | ICD-10-CM | POA: Diagnosis not present

## 2019-02-09 DIAGNOSIS — I700 Atherosclerosis of aorta: Secondary | ICD-10-CM | POA: Diagnosis not present

## 2019-02-09 DIAGNOSIS — I10 Essential (primary) hypertension: Secondary | ICD-10-CM | POA: Diagnosis not present

## 2019-02-09 DIAGNOSIS — D509 Iron deficiency anemia, unspecified: Secondary | ICD-10-CM | POA: Diagnosis not present

## 2019-02-09 DIAGNOSIS — M80052D Age-related osteoporosis with current pathological fracture, left femur, subsequent encounter for fracture with routine healing: Secondary | ICD-10-CM | POA: Diagnosis not present

## 2019-02-09 DIAGNOSIS — F329 Major depressive disorder, single episode, unspecified: Secondary | ICD-10-CM | POA: Diagnosis not present

## 2019-02-11 DIAGNOSIS — I700 Atherosclerosis of aorta: Secondary | ICD-10-CM | POA: Diagnosis not present

## 2019-02-11 DIAGNOSIS — D509 Iron deficiency anemia, unspecified: Secondary | ICD-10-CM | POA: Diagnosis not present

## 2019-02-11 DIAGNOSIS — F329 Major depressive disorder, single episode, unspecified: Secondary | ICD-10-CM | POA: Diagnosis not present

## 2019-02-11 DIAGNOSIS — M519 Unspecified thoracic, thoracolumbar and lumbosacral intervertebral disc disorder: Secondary | ICD-10-CM | POA: Diagnosis not present

## 2019-02-11 DIAGNOSIS — I10 Essential (primary) hypertension: Secondary | ICD-10-CM | POA: Diagnosis not present

## 2019-02-11 DIAGNOSIS — M80052D Age-related osteoporosis with current pathological fracture, left femur, subsequent encounter for fracture with routine healing: Secondary | ICD-10-CM | POA: Diagnosis not present

## 2019-02-12 DIAGNOSIS — D509 Iron deficiency anemia, unspecified: Secondary | ICD-10-CM | POA: Diagnosis not present

## 2019-02-12 DIAGNOSIS — I10 Essential (primary) hypertension: Secondary | ICD-10-CM | POA: Diagnosis not present

## 2019-02-12 DIAGNOSIS — I700 Atherosclerosis of aorta: Secondary | ICD-10-CM | POA: Diagnosis not present

## 2019-02-12 DIAGNOSIS — M80052D Age-related osteoporosis with current pathological fracture, left femur, subsequent encounter for fracture with routine healing: Secondary | ICD-10-CM | POA: Diagnosis not present

## 2019-02-12 DIAGNOSIS — M519 Unspecified thoracic, thoracolumbar and lumbosacral intervertebral disc disorder: Secondary | ICD-10-CM | POA: Diagnosis not present

## 2019-02-12 DIAGNOSIS — F329 Major depressive disorder, single episode, unspecified: Secondary | ICD-10-CM | POA: Diagnosis not present

## 2019-02-14 IMAGING — CT CT HEAD W/O CM
3 of 4 series · 15 of 47 positions shown, 18 images · non-contrast
Comparison: 09/16/2010

ADDENDUM:
Series 3, image 1 with subtle lucency at the junction of the basion
and C1 ring. It is possible this is related to motion, degenerative
changes and partial volume averaging however, cervical spine CT
would be necessary to exclude fracture if of clinical concern.

These results were called by telephone at the time of addendum on
07/08/2017 at [DATE] to Dr. Catelen, who verbally acknowledged these
results.
CLINICAL DATA: [AGE] hypertensive female post fall within the
past few days. Unsteady and confused today. Initial encounter.
EXAM:
CT HEAD WITHOUT CONTRAST
TECHNIQUE: Contiguous axial images were obtained from the base of the skull
through the vertex without intravenous contrast.

[Series 32: 3d filtered head w/o · axial · non-contrast · 0.37mm/px · z∈[-39,+81]mm · 9 of 30 slices shown, 12 images]
[im 3/30  brain]
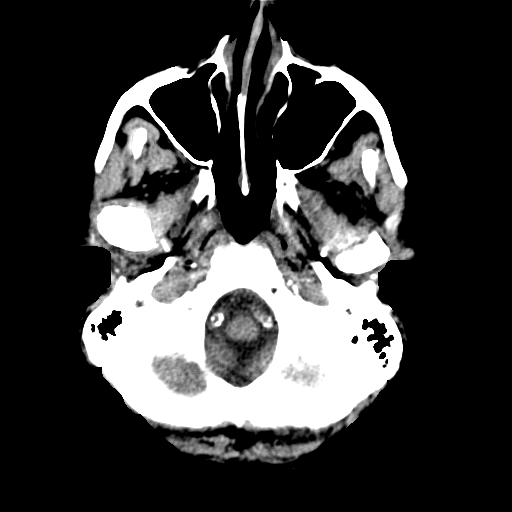
[im 3/30  bone]
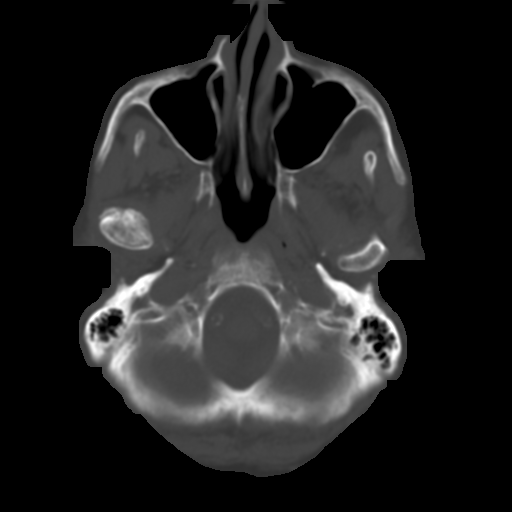
[im 7/30  brain]
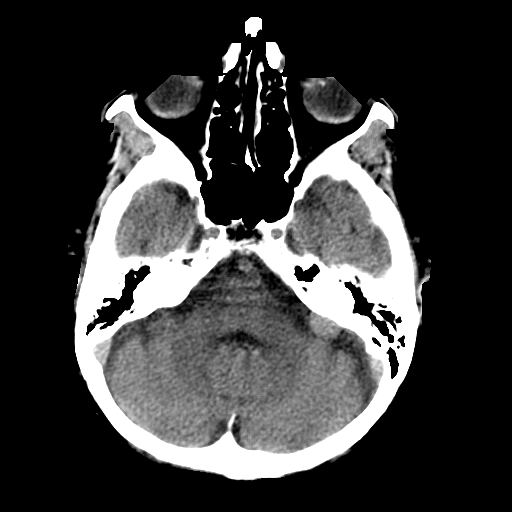
[im 9/30  brain]
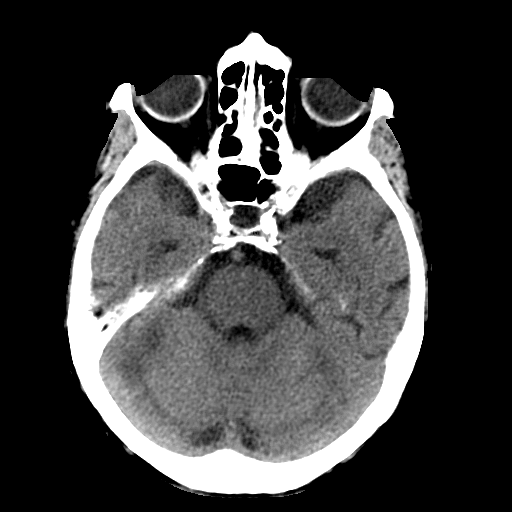
[im 13/30  brain]
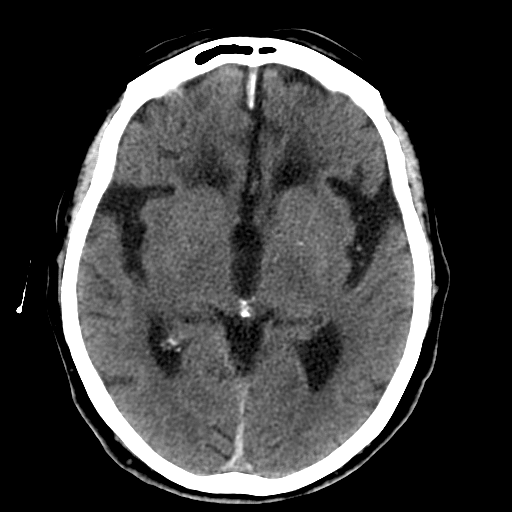
[im 15/30  brain]
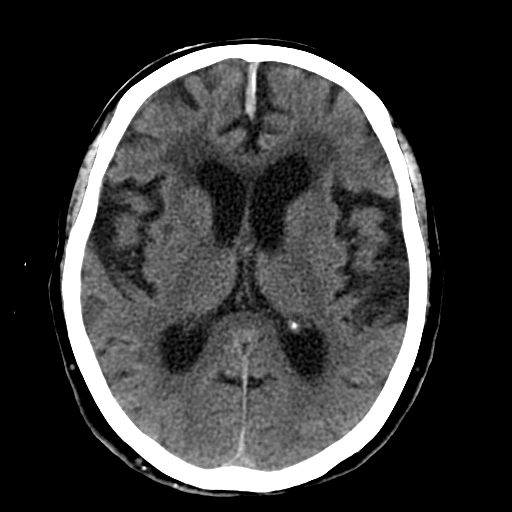
[im 15/30  bone]
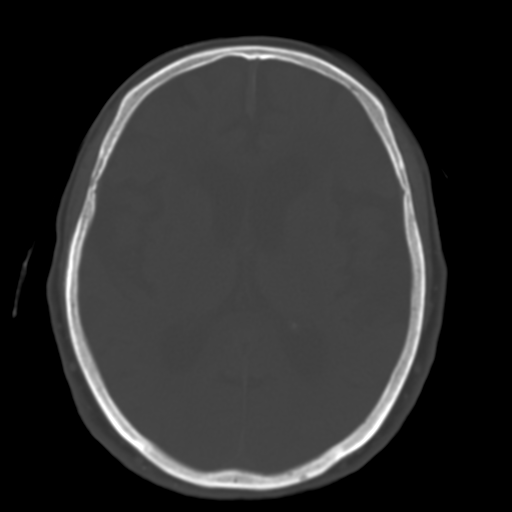
[im 17/30  brain]
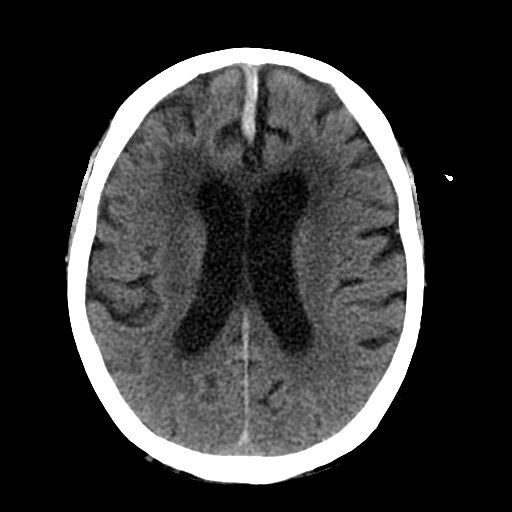
[im 21/30  brain]
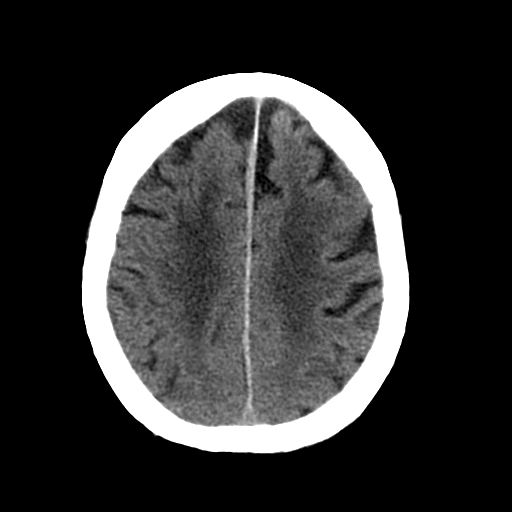
[im 23/30  brain]
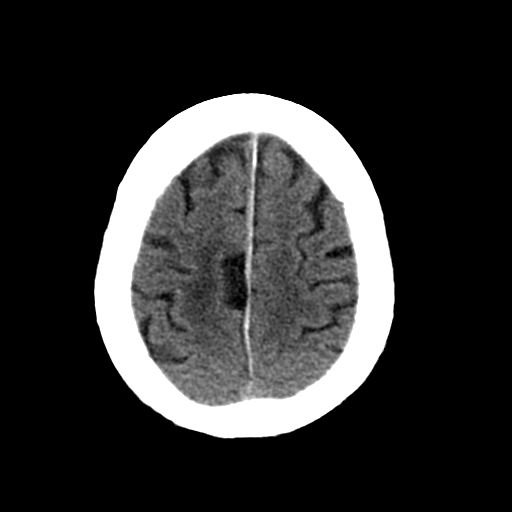
[im 27/30  brain]
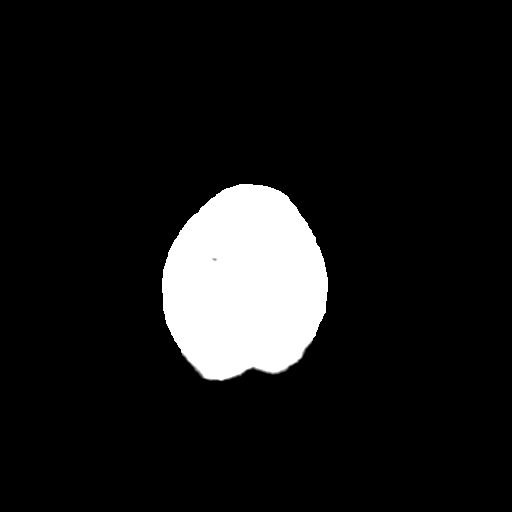
[im 27/30  bone]
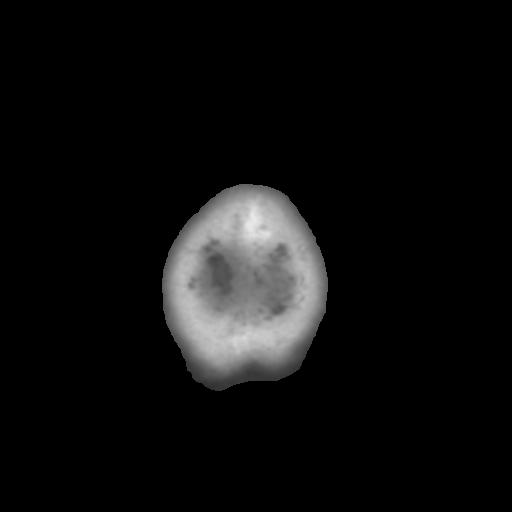

[Series 601: coronal brain · coronal · 0.37mm/px · 3 of 64 slices shown]
[im 22/64  brain]
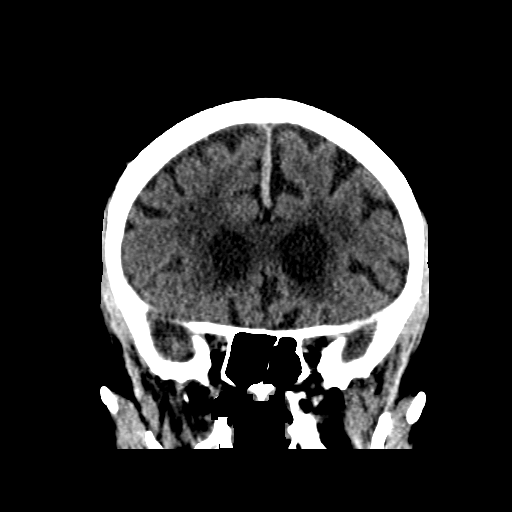
[im 29/64  brain]
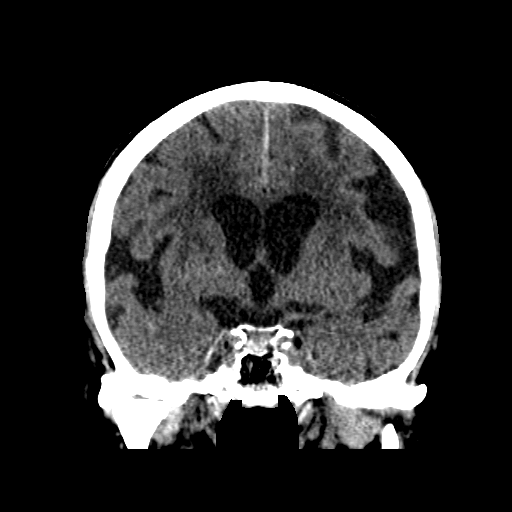
[im 36/64  brain]
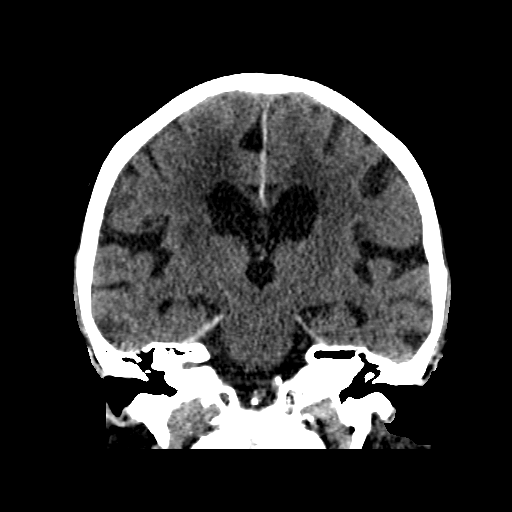

[Series 602: sagittal brain · sagittal · 0.37mm/px · 3 of 56 slices shown]
[im 19/56  brain]
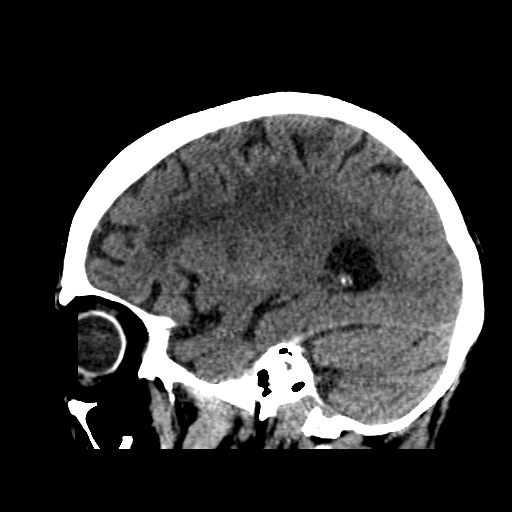
[im 28/56  brain]
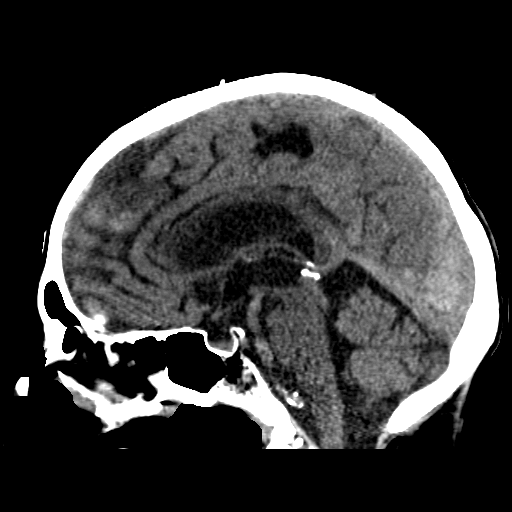
[im 37/56  brain]
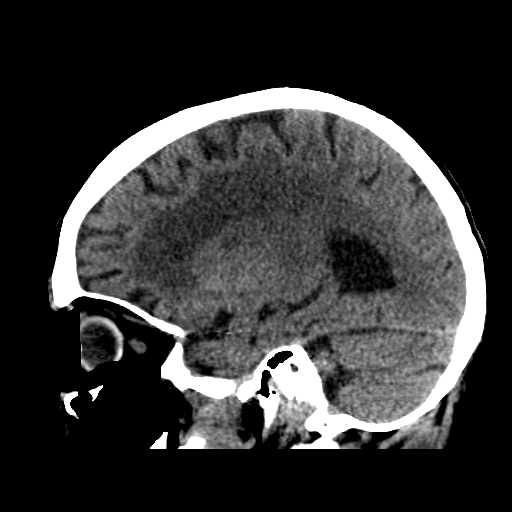

[15 of 47 positions shown; findings below may reference images not displayed]

FINDINGS: Brain: Broad-based right parafalcine subdural hematoma with maximal
thickness frontal region of 5.9 mm. No significant mass effect upon
adjacent brain parenchyma.

Tiny left convexity extra-axial hyperdensity (series 601, image 27
and 602 image 35) possibly a small amount of subdural blood as
versus tiny meningioma without mass effect.

Prominent chronic microvascular changes without CT evidence of
infarct.

Global atrophy without hydrocephalus.

Vascular: Vascular calcifications

Skull: No skull fracture

Sinuses/Orbits: No acute orbital abnormality. Visualized paranasal
sinuses are clear.

Other: Mastoid air cells and middle ear cavities are clear.

Right temporomandibular joint degenerative changes with minimal bony
fragmentation without change.
IMPRESSION: Broad-based right parafalcine subdural hematoma with maximal
thickness frontal region of 5.9 mm. No significant mass effect upon
adjacent brain parenchyma.

Tiny left convexity extra-axial hyperdensity (series 601, image 27
and 602 image 35) possibly a small amount of subdural blood as
versus tiny meningioma without mass effect.

Prominent chronic microvascular changes without CT evidence of
infarct.

Global atrophy.

These results were called by telephone at the time of interpretation
on 07/08/2017 at [DATE] to Dr. ELIZA GUISELLA BETUN , who verbally
acknowledged these results.

## 2019-02-14 IMAGING — CT CT CERVICAL SPINE W/O CM
3 of 4 series · 15 of 35 positions shown, 18 images · non-contrast
Comparison: Head CT 07/08/2017, CT cervical spine 11/15/2008

CLINICAL DATA: History of fall with head bleed

EXAM:
CT CERVICAL SPINE WITHOUT CONTRAST
TECHNIQUE: Multidetector CT imaging of the cervical spine was performed without
intravenous contrast. Multiplanar CT image reconstructions were also
generated.

[Series 5: c_spine 2.0 st · axial · 0.29mm/px · z∈[-170,-56]mm · 8 of 69 slices shown, 10 images]
[im 6/69  soft-tissue]
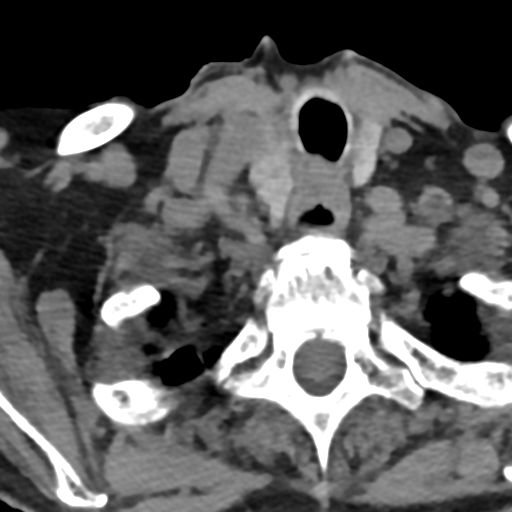
[im 6/69  bone]
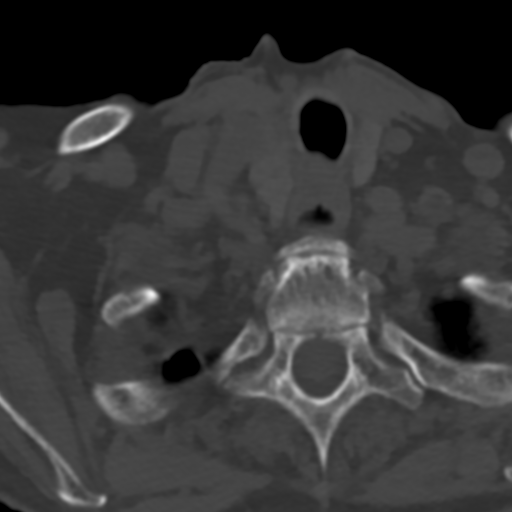
[im 16/69  bone]
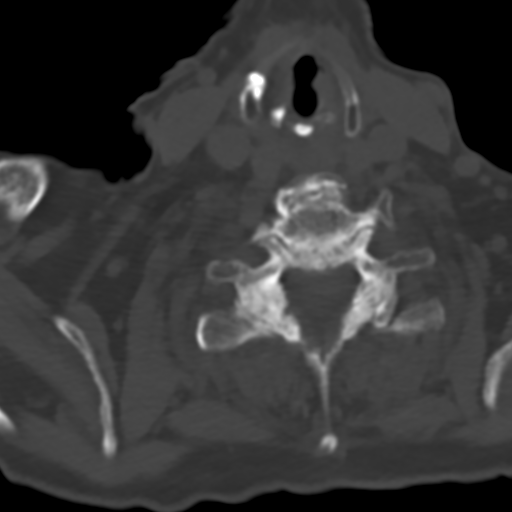
[im 21/69  bone]
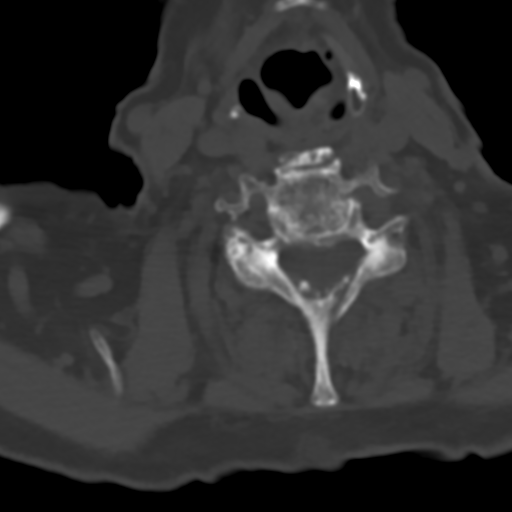
[im 32/69  bone]
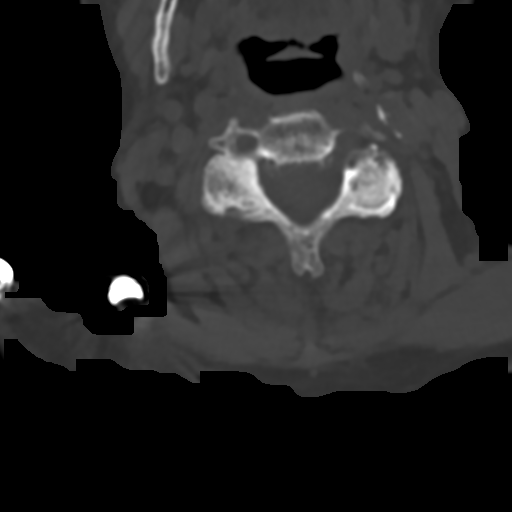
[im 37/69  soft-tissue]
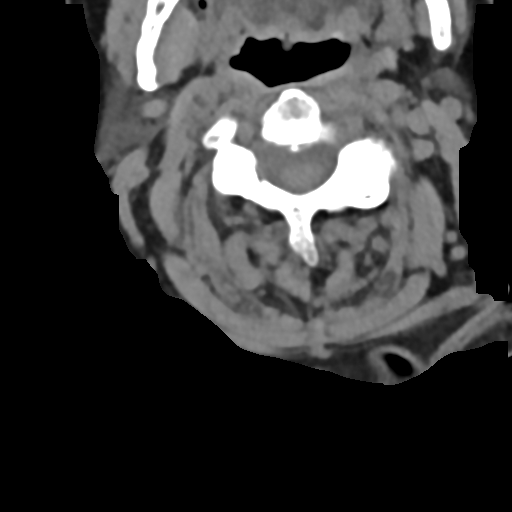
[im 37/69  bone]
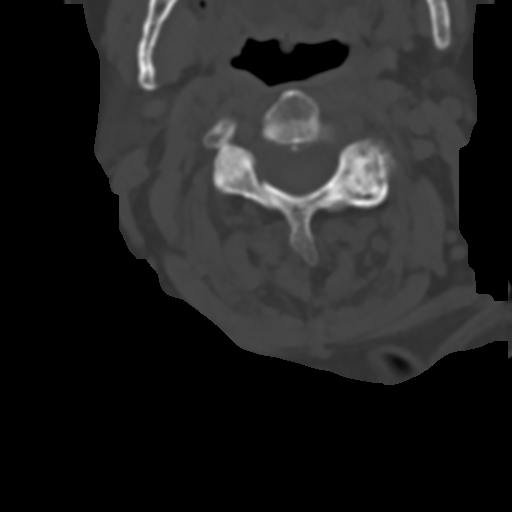
[im 48/69  bone]
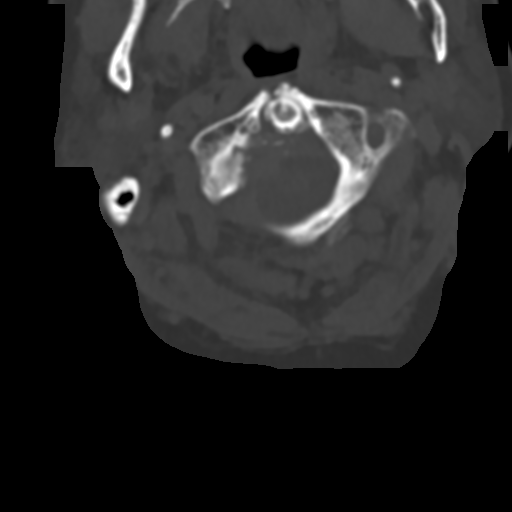
[im 53/69  bone]
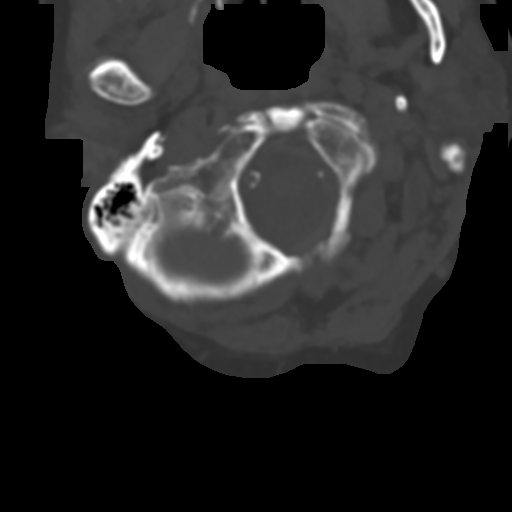
[im 63/69  bone]
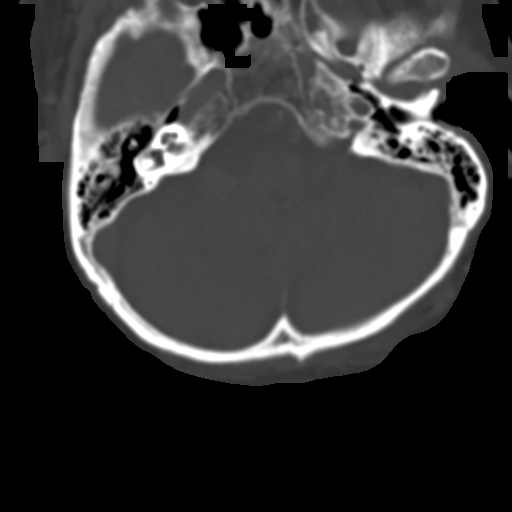

[Series 6: coronal bone · coronal · 0.21mm/px · 2 of 61 slices shown]
[im 21/61  bone]
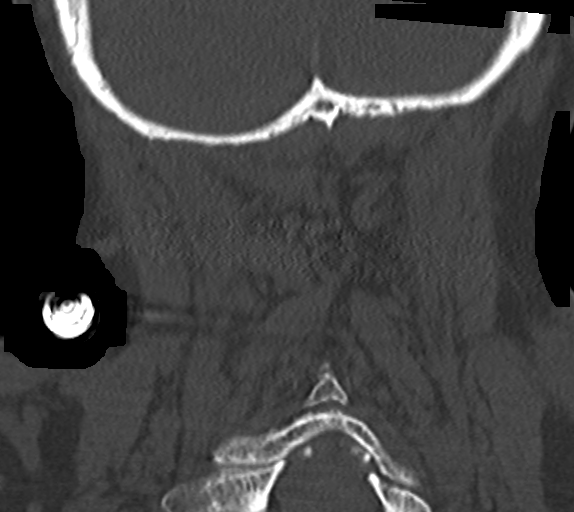
[im 41/61  bone]
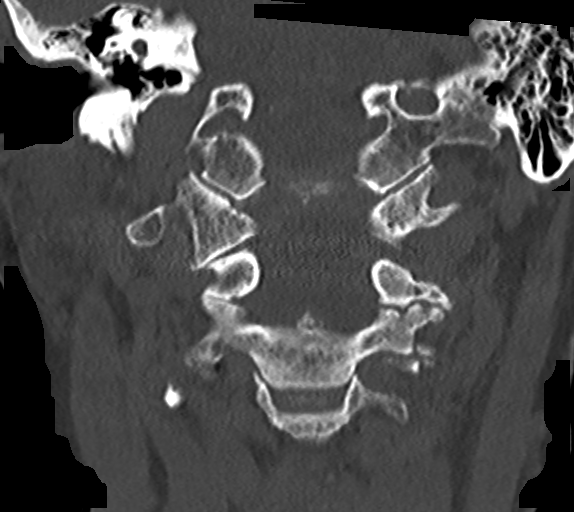

[Series 7: sagittal bone · sagittal · 0.27mm/px · 5 of 61 slices shown, 6 images]
[im 21/61  bone]
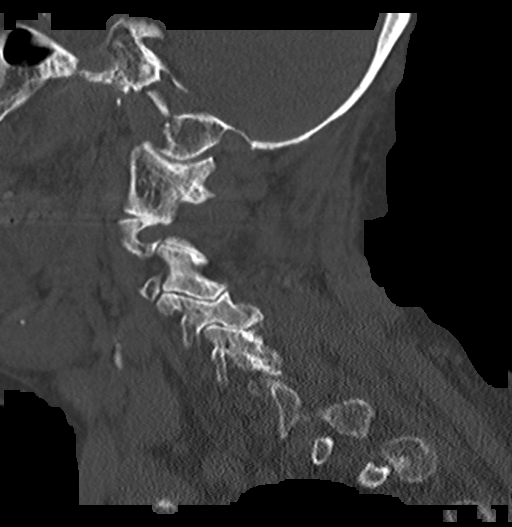
[im 26/61  bone]
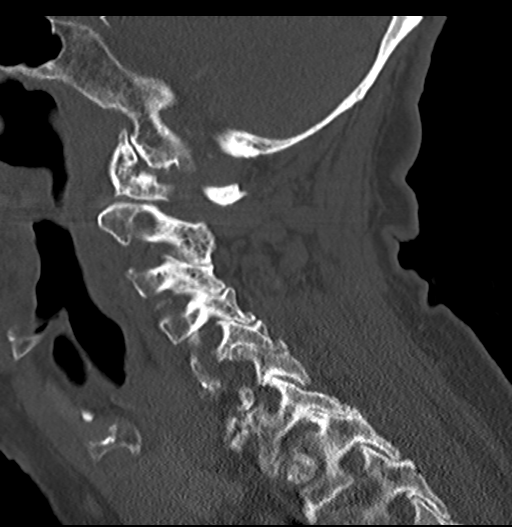
[im 31/61  soft-tissue]
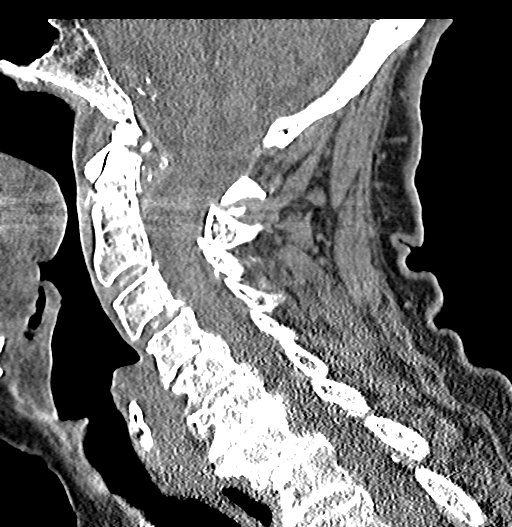
[im 31/61  bone]
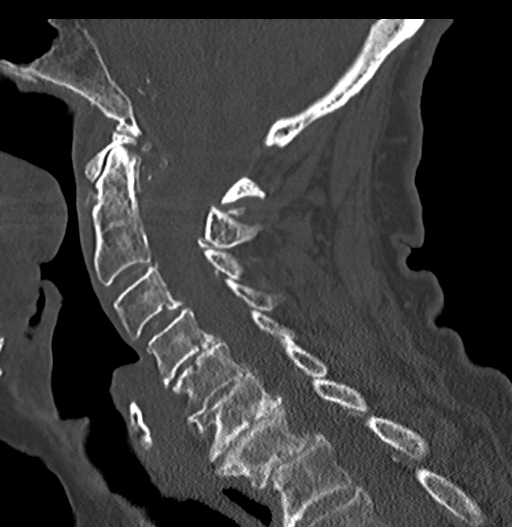
[im 36/61  bone]
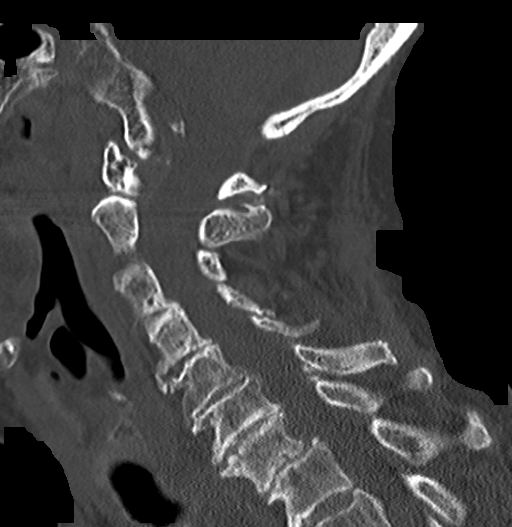
[im 41/61  bone]
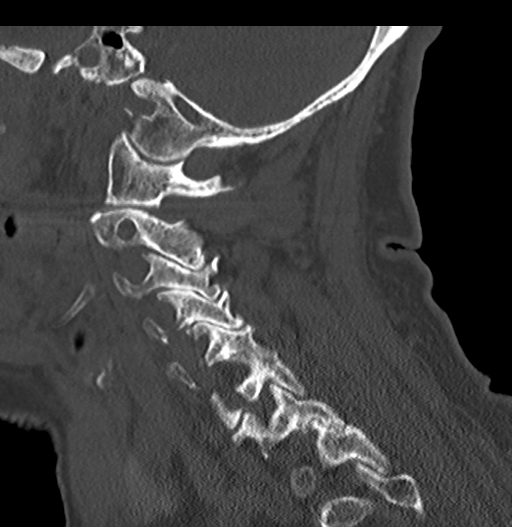

[15 of 35 positions shown; findings below may reference images not displayed]

FINDINGS: Alignment: 5 mm anterolisthesis of C7 on T1 unchanged. Trace
anterolisthesis of C3 on C4, also unchanged. Facet alignment is
within normal limits.

Skull base and vertebrae: Craniovertebral junction is intact. Focal
defect within the anterior arch of C1 slightly to the right of
midline not seen on comparison CT. Vertebral body heights appear
normal.

Soft tissues and spinal canal: No prevertebral fluid or swelling. No
visible canal hematoma. Prominent pannus at the tip of the dens.

Disc levels: Advanced degenerative changes from C4 through T1 with
disc space narrowing, endplate changes and osteophyte. Multilevel
bilateral hypertrophic facet arthropathy with multiple level
foraminal stenosis.

Upper chest: Right apical scarring. Heterogeneous thyroid without
dominant mass.

Other: None
IMPRESSION: 1. No change in alignment of the cervical spine with anterolisthesis
of C3 on C4 and C7 on T1.
2. Focal defect in the anterior arch of C1, new since comparison CT
from 4232, suspect that this represents progression of degenerative
changes or inflammatory arthropathy at the C1-C2 articulation as
opposed to a fracture.
3. Advanced degenerative changes.

## 2019-02-16 DIAGNOSIS — F329 Major depressive disorder, single episode, unspecified: Secondary | ICD-10-CM | POA: Diagnosis not present

## 2019-02-16 DIAGNOSIS — D509 Iron deficiency anemia, unspecified: Secondary | ICD-10-CM | POA: Diagnosis not present

## 2019-02-16 DIAGNOSIS — I700 Atherosclerosis of aorta: Secondary | ICD-10-CM | POA: Diagnosis not present

## 2019-02-16 DIAGNOSIS — I10 Essential (primary) hypertension: Secondary | ICD-10-CM | POA: Diagnosis not present

## 2019-02-16 DIAGNOSIS — M519 Unspecified thoracic, thoracolumbar and lumbosacral intervertebral disc disorder: Secondary | ICD-10-CM | POA: Diagnosis not present

## 2019-02-16 DIAGNOSIS — M80052D Age-related osteoporosis with current pathological fracture, left femur, subsequent encounter for fracture with routine healing: Secondary | ICD-10-CM | POA: Diagnosis not present

## 2019-02-18 DIAGNOSIS — R2681 Unsteadiness on feet: Secondary | ICD-10-CM | POA: Diagnosis not present

## 2019-02-18 DIAGNOSIS — R278 Other lack of coordination: Secondary | ICD-10-CM | POA: Diagnosis not present

## 2019-02-18 DIAGNOSIS — M6281 Muscle weakness (generalized): Secondary | ICD-10-CM | POA: Diagnosis not present

## 2019-02-18 DIAGNOSIS — R296 Repeated falls: Secondary | ICD-10-CM | POA: Diagnosis not present

## 2019-02-18 DIAGNOSIS — Z9181 History of falling: Secondary | ICD-10-CM | POA: Diagnosis not present

## 2019-02-19 DIAGNOSIS — R278 Other lack of coordination: Secondary | ICD-10-CM | POA: Diagnosis not present

## 2019-02-19 DIAGNOSIS — R296 Repeated falls: Secondary | ICD-10-CM | POA: Diagnosis not present

## 2019-02-19 DIAGNOSIS — M6281 Muscle weakness (generalized): Secondary | ICD-10-CM | POA: Diagnosis not present

## 2019-02-19 DIAGNOSIS — R2681 Unsteadiness on feet: Secondary | ICD-10-CM | POA: Diagnosis not present

## 2019-02-19 DIAGNOSIS — Z9181 History of falling: Secondary | ICD-10-CM | POA: Diagnosis not present

## 2019-02-22 DIAGNOSIS — M6281 Muscle weakness (generalized): Secondary | ICD-10-CM | POA: Diagnosis not present

## 2019-02-22 DIAGNOSIS — R2681 Unsteadiness on feet: Secondary | ICD-10-CM | POA: Diagnosis not present

## 2019-02-22 DIAGNOSIS — R296 Repeated falls: Secondary | ICD-10-CM | POA: Diagnosis not present

## 2019-02-22 DIAGNOSIS — Z9181 History of falling: Secondary | ICD-10-CM | POA: Diagnosis not present

## 2019-02-22 DIAGNOSIS — R278 Other lack of coordination: Secondary | ICD-10-CM | POA: Diagnosis not present

## 2019-02-23 DIAGNOSIS — R296 Repeated falls: Secondary | ICD-10-CM | POA: Diagnosis not present

## 2019-02-23 DIAGNOSIS — R2681 Unsteadiness on feet: Secondary | ICD-10-CM | POA: Diagnosis not present

## 2019-02-23 DIAGNOSIS — Z9181 History of falling: Secondary | ICD-10-CM | POA: Diagnosis not present

## 2019-02-23 DIAGNOSIS — R278 Other lack of coordination: Secondary | ICD-10-CM | POA: Diagnosis not present

## 2019-02-23 DIAGNOSIS — M6281 Muscle weakness (generalized): Secondary | ICD-10-CM | POA: Diagnosis not present

## 2019-02-24 DIAGNOSIS — R296 Repeated falls: Secondary | ICD-10-CM | POA: Diagnosis not present

## 2019-02-24 DIAGNOSIS — R2681 Unsteadiness on feet: Secondary | ICD-10-CM | POA: Diagnosis not present

## 2019-02-24 DIAGNOSIS — M6281 Muscle weakness (generalized): Secondary | ICD-10-CM | POA: Diagnosis not present

## 2019-02-24 DIAGNOSIS — Z9181 History of falling: Secondary | ICD-10-CM | POA: Diagnosis not present

## 2019-02-24 DIAGNOSIS — R278 Other lack of coordination: Secondary | ICD-10-CM | POA: Diagnosis not present

## 2019-02-25 DIAGNOSIS — R2681 Unsteadiness on feet: Secondary | ICD-10-CM | POA: Diagnosis not present

## 2019-02-25 DIAGNOSIS — Z9181 History of falling: Secondary | ICD-10-CM | POA: Diagnosis not present

## 2019-02-25 DIAGNOSIS — M6281 Muscle weakness (generalized): Secondary | ICD-10-CM | POA: Diagnosis not present

## 2019-02-25 DIAGNOSIS — R296 Repeated falls: Secondary | ICD-10-CM | POA: Diagnosis not present

## 2019-02-25 DIAGNOSIS — R278 Other lack of coordination: Secondary | ICD-10-CM | POA: Diagnosis not present

## 2019-02-26 DIAGNOSIS — R2681 Unsteadiness on feet: Secondary | ICD-10-CM | POA: Diagnosis not present

## 2019-02-26 DIAGNOSIS — R278 Other lack of coordination: Secondary | ICD-10-CM | POA: Diagnosis not present

## 2019-02-26 DIAGNOSIS — Z9181 History of falling: Secondary | ICD-10-CM | POA: Diagnosis not present

## 2019-02-26 DIAGNOSIS — R296 Repeated falls: Secondary | ICD-10-CM | POA: Diagnosis not present

## 2019-02-26 DIAGNOSIS — M6281 Muscle weakness (generalized): Secondary | ICD-10-CM | POA: Diagnosis not present

## 2019-03-02 DIAGNOSIS — Z9181 History of falling: Secondary | ICD-10-CM | POA: Diagnosis not present

## 2019-03-02 DIAGNOSIS — R278 Other lack of coordination: Secondary | ICD-10-CM | POA: Diagnosis not present

## 2019-03-02 DIAGNOSIS — R296 Repeated falls: Secondary | ICD-10-CM | POA: Diagnosis not present

## 2019-03-02 DIAGNOSIS — R2681 Unsteadiness on feet: Secondary | ICD-10-CM | POA: Diagnosis not present

## 2019-03-02 DIAGNOSIS — M6281 Muscle weakness (generalized): Secondary | ICD-10-CM | POA: Diagnosis not present

## 2019-03-03 DIAGNOSIS — R278 Other lack of coordination: Secondary | ICD-10-CM | POA: Diagnosis not present

## 2019-03-03 DIAGNOSIS — R296 Repeated falls: Secondary | ICD-10-CM | POA: Diagnosis not present

## 2019-03-03 DIAGNOSIS — Z9181 History of falling: Secondary | ICD-10-CM | POA: Diagnosis not present

## 2019-03-03 DIAGNOSIS — R2681 Unsteadiness on feet: Secondary | ICD-10-CM | POA: Diagnosis not present

## 2019-03-03 DIAGNOSIS — M6281 Muscle weakness (generalized): Secondary | ICD-10-CM | POA: Diagnosis not present

## 2019-03-04 DIAGNOSIS — M6281 Muscle weakness (generalized): Secondary | ICD-10-CM | POA: Diagnosis not present

## 2019-03-04 DIAGNOSIS — R2681 Unsteadiness on feet: Secondary | ICD-10-CM | POA: Diagnosis not present

## 2019-03-04 DIAGNOSIS — Z9181 History of falling: Secondary | ICD-10-CM | POA: Diagnosis not present

## 2019-03-04 DIAGNOSIS — R296 Repeated falls: Secondary | ICD-10-CM | POA: Diagnosis not present

## 2019-03-04 DIAGNOSIS — R278 Other lack of coordination: Secondary | ICD-10-CM | POA: Diagnosis not present

## 2019-03-05 DIAGNOSIS — R296 Repeated falls: Secondary | ICD-10-CM | POA: Diagnosis not present

## 2019-03-05 DIAGNOSIS — M6281 Muscle weakness (generalized): Secondary | ICD-10-CM | POA: Diagnosis not present

## 2019-03-05 DIAGNOSIS — R278 Other lack of coordination: Secondary | ICD-10-CM | POA: Diagnosis not present

## 2019-03-05 DIAGNOSIS — Z9181 History of falling: Secondary | ICD-10-CM | POA: Diagnosis not present

## 2019-03-05 DIAGNOSIS — R2681 Unsteadiness on feet: Secondary | ICD-10-CM | POA: Diagnosis not present

## 2019-03-08 DIAGNOSIS — M6281 Muscle weakness (generalized): Secondary | ICD-10-CM | POA: Diagnosis not present

## 2019-03-08 DIAGNOSIS — R2681 Unsteadiness on feet: Secondary | ICD-10-CM | POA: Diagnosis not present

## 2019-03-08 DIAGNOSIS — R296 Repeated falls: Secondary | ICD-10-CM | POA: Diagnosis not present

## 2019-03-08 DIAGNOSIS — Z9181 History of falling: Secondary | ICD-10-CM | POA: Diagnosis not present

## 2019-03-08 DIAGNOSIS — R278 Other lack of coordination: Secondary | ICD-10-CM | POA: Diagnosis not present

## 2019-03-09 DIAGNOSIS — R296 Repeated falls: Secondary | ICD-10-CM | POA: Diagnosis not present

## 2019-03-09 DIAGNOSIS — Z9181 History of falling: Secondary | ICD-10-CM | POA: Diagnosis not present

## 2019-03-09 DIAGNOSIS — R2681 Unsteadiness on feet: Secondary | ICD-10-CM | POA: Diagnosis not present

## 2019-03-09 DIAGNOSIS — M6281 Muscle weakness (generalized): Secondary | ICD-10-CM | POA: Diagnosis not present

## 2019-03-09 DIAGNOSIS — R278 Other lack of coordination: Secondary | ICD-10-CM | POA: Diagnosis not present

## 2019-03-10 DIAGNOSIS — R278 Other lack of coordination: Secondary | ICD-10-CM | POA: Diagnosis not present

## 2019-03-10 DIAGNOSIS — R2681 Unsteadiness on feet: Secondary | ICD-10-CM | POA: Diagnosis not present

## 2019-03-10 DIAGNOSIS — M6281 Muscle weakness (generalized): Secondary | ICD-10-CM | POA: Diagnosis not present

## 2019-03-10 DIAGNOSIS — R296 Repeated falls: Secondary | ICD-10-CM | POA: Diagnosis not present

## 2019-03-10 DIAGNOSIS — Z9181 History of falling: Secondary | ICD-10-CM | POA: Diagnosis not present

## 2019-03-11 DIAGNOSIS — M6281 Muscle weakness (generalized): Secondary | ICD-10-CM | POA: Diagnosis not present

## 2019-03-11 DIAGNOSIS — Z9181 History of falling: Secondary | ICD-10-CM | POA: Diagnosis not present

## 2019-03-11 DIAGNOSIS — R2681 Unsteadiness on feet: Secondary | ICD-10-CM | POA: Diagnosis not present

## 2019-03-11 DIAGNOSIS — R278 Other lack of coordination: Secondary | ICD-10-CM | POA: Diagnosis not present

## 2019-03-11 DIAGNOSIS — R296 Repeated falls: Secondary | ICD-10-CM | POA: Diagnosis not present

## 2019-03-12 DIAGNOSIS — R2681 Unsteadiness on feet: Secondary | ICD-10-CM | POA: Diagnosis not present

## 2019-03-12 DIAGNOSIS — Z9181 History of falling: Secondary | ICD-10-CM | POA: Diagnosis not present

## 2019-03-12 DIAGNOSIS — M6281 Muscle weakness (generalized): Secondary | ICD-10-CM | POA: Diagnosis not present

## 2019-03-12 DIAGNOSIS — R278 Other lack of coordination: Secondary | ICD-10-CM | POA: Diagnosis not present

## 2019-03-12 DIAGNOSIS — R296 Repeated falls: Secondary | ICD-10-CM | POA: Diagnosis not present

## 2019-03-15 DIAGNOSIS — R2681 Unsteadiness on feet: Secondary | ICD-10-CM | POA: Diagnosis not present

## 2019-03-15 DIAGNOSIS — R296 Repeated falls: Secondary | ICD-10-CM | POA: Diagnosis not present

## 2019-03-15 DIAGNOSIS — Z9181 History of falling: Secondary | ICD-10-CM | POA: Diagnosis not present

## 2019-03-15 DIAGNOSIS — R278 Other lack of coordination: Secondary | ICD-10-CM | POA: Diagnosis not present

## 2019-03-15 DIAGNOSIS — M6281 Muscle weakness (generalized): Secondary | ICD-10-CM | POA: Diagnosis not present

## 2019-03-16 DIAGNOSIS — M6281 Muscle weakness (generalized): Secondary | ICD-10-CM | POA: Diagnosis not present

## 2019-03-16 DIAGNOSIS — R296 Repeated falls: Secondary | ICD-10-CM | POA: Diagnosis not present

## 2019-03-16 DIAGNOSIS — R278 Other lack of coordination: Secondary | ICD-10-CM | POA: Diagnosis not present

## 2019-03-16 DIAGNOSIS — R2681 Unsteadiness on feet: Secondary | ICD-10-CM | POA: Diagnosis not present

## 2019-03-16 DIAGNOSIS — Z9181 History of falling: Secondary | ICD-10-CM | POA: Diagnosis not present

## 2019-03-17 DIAGNOSIS — M6281 Muscle weakness (generalized): Secondary | ICD-10-CM | POA: Diagnosis not present

## 2019-03-17 DIAGNOSIS — R278 Other lack of coordination: Secondary | ICD-10-CM | POA: Diagnosis not present

## 2019-03-17 DIAGNOSIS — R296 Repeated falls: Secondary | ICD-10-CM | POA: Diagnosis not present

## 2019-03-17 DIAGNOSIS — R2681 Unsteadiness on feet: Secondary | ICD-10-CM | POA: Diagnosis not present

## 2019-03-17 DIAGNOSIS — Z9181 History of falling: Secondary | ICD-10-CM | POA: Diagnosis not present

## 2019-03-18 DIAGNOSIS — Z9181 History of falling: Secondary | ICD-10-CM | POA: Diagnosis not present

## 2019-03-18 DIAGNOSIS — M6281 Muscle weakness (generalized): Secondary | ICD-10-CM | POA: Diagnosis not present

## 2019-03-18 DIAGNOSIS — R278 Other lack of coordination: Secondary | ICD-10-CM | POA: Diagnosis not present

## 2019-03-18 DIAGNOSIS — R296 Repeated falls: Secondary | ICD-10-CM | POA: Diagnosis not present

## 2019-03-18 DIAGNOSIS — R2681 Unsteadiness on feet: Secondary | ICD-10-CM | POA: Diagnosis not present

## 2019-03-19 DIAGNOSIS — R296 Repeated falls: Secondary | ICD-10-CM | POA: Diagnosis not present

## 2019-03-19 DIAGNOSIS — M6281 Muscle weakness (generalized): Secondary | ICD-10-CM | POA: Diagnosis not present

## 2019-03-19 DIAGNOSIS — Z9181 History of falling: Secondary | ICD-10-CM | POA: Diagnosis not present

## 2019-03-19 DIAGNOSIS — R2681 Unsteadiness on feet: Secondary | ICD-10-CM | POA: Diagnosis not present

## 2019-03-19 DIAGNOSIS — R278 Other lack of coordination: Secondary | ICD-10-CM | POA: Diagnosis not present

## 2019-03-22 DIAGNOSIS — Z9181 History of falling: Secondary | ICD-10-CM | POA: Diagnosis not present

## 2019-03-22 DIAGNOSIS — R278 Other lack of coordination: Secondary | ICD-10-CM | POA: Diagnosis not present

## 2019-03-22 DIAGNOSIS — R296 Repeated falls: Secondary | ICD-10-CM | POA: Diagnosis not present

## 2019-03-22 DIAGNOSIS — M6281 Muscle weakness (generalized): Secondary | ICD-10-CM | POA: Diagnosis not present

## 2019-03-22 DIAGNOSIS — R2681 Unsteadiness on feet: Secondary | ICD-10-CM | POA: Diagnosis not present

## 2019-03-23 DIAGNOSIS — R278 Other lack of coordination: Secondary | ICD-10-CM | POA: Diagnosis not present

## 2019-03-23 DIAGNOSIS — R296 Repeated falls: Secondary | ICD-10-CM | POA: Diagnosis not present

## 2019-03-23 DIAGNOSIS — Z9181 History of falling: Secondary | ICD-10-CM | POA: Diagnosis not present

## 2019-03-23 DIAGNOSIS — M6281 Muscle weakness (generalized): Secondary | ICD-10-CM | POA: Diagnosis not present

## 2019-03-23 DIAGNOSIS — R2681 Unsteadiness on feet: Secondary | ICD-10-CM | POA: Diagnosis not present

## 2019-03-25 DIAGNOSIS — Z9181 History of falling: Secondary | ICD-10-CM | POA: Diagnosis not present

## 2019-03-25 DIAGNOSIS — R296 Repeated falls: Secondary | ICD-10-CM | POA: Diagnosis not present

## 2019-03-25 DIAGNOSIS — R278 Other lack of coordination: Secondary | ICD-10-CM | POA: Diagnosis not present

## 2019-03-25 DIAGNOSIS — R2681 Unsteadiness on feet: Secondary | ICD-10-CM | POA: Diagnosis not present

## 2019-03-25 DIAGNOSIS — M6281 Muscle weakness (generalized): Secondary | ICD-10-CM | POA: Diagnosis not present

## 2019-03-26 DIAGNOSIS — R296 Repeated falls: Secondary | ICD-10-CM | POA: Diagnosis not present

## 2019-03-26 DIAGNOSIS — M6281 Muscle weakness (generalized): Secondary | ICD-10-CM | POA: Diagnosis not present

## 2019-03-26 DIAGNOSIS — Z9181 History of falling: Secondary | ICD-10-CM | POA: Diagnosis not present

## 2019-03-26 DIAGNOSIS — R2681 Unsteadiness on feet: Secondary | ICD-10-CM | POA: Diagnosis not present

## 2019-03-26 DIAGNOSIS — R278 Other lack of coordination: Secondary | ICD-10-CM | POA: Diagnosis not present

## 2019-03-29 DIAGNOSIS — R278 Other lack of coordination: Secondary | ICD-10-CM | POA: Diagnosis not present

## 2019-03-29 DIAGNOSIS — M6281 Muscle weakness (generalized): Secondary | ICD-10-CM | POA: Diagnosis not present

## 2019-03-29 DIAGNOSIS — R296 Repeated falls: Secondary | ICD-10-CM | POA: Diagnosis not present

## 2019-03-29 DIAGNOSIS — Z9181 History of falling: Secondary | ICD-10-CM | POA: Diagnosis not present

## 2019-03-29 DIAGNOSIS — R2681 Unsteadiness on feet: Secondary | ICD-10-CM | POA: Diagnosis not present

## 2019-03-30 DIAGNOSIS — Z9181 History of falling: Secondary | ICD-10-CM | POA: Diagnosis not present

## 2019-03-30 DIAGNOSIS — R2681 Unsteadiness on feet: Secondary | ICD-10-CM | POA: Diagnosis not present

## 2019-03-30 DIAGNOSIS — M6281 Muscle weakness (generalized): Secondary | ICD-10-CM | POA: Diagnosis not present

## 2019-03-30 DIAGNOSIS — R296 Repeated falls: Secondary | ICD-10-CM | POA: Diagnosis not present

## 2019-03-30 DIAGNOSIS — R278 Other lack of coordination: Secondary | ICD-10-CM | POA: Diagnosis not present

## 2019-03-31 DIAGNOSIS — R2681 Unsteadiness on feet: Secondary | ICD-10-CM | POA: Diagnosis not present

## 2019-03-31 DIAGNOSIS — Z9181 History of falling: Secondary | ICD-10-CM | POA: Diagnosis not present

## 2019-03-31 DIAGNOSIS — M6281 Muscle weakness (generalized): Secondary | ICD-10-CM | POA: Diagnosis not present

## 2019-03-31 DIAGNOSIS — R278 Other lack of coordination: Secondary | ICD-10-CM | POA: Diagnosis not present

## 2019-03-31 DIAGNOSIS — R296 Repeated falls: Secondary | ICD-10-CM | POA: Diagnosis not present

## 2019-04-02 DIAGNOSIS — R278 Other lack of coordination: Secondary | ICD-10-CM | POA: Diagnosis not present

## 2019-04-02 DIAGNOSIS — R296 Repeated falls: Secondary | ICD-10-CM | POA: Diagnosis not present

## 2019-04-02 DIAGNOSIS — M6281 Muscle weakness (generalized): Secondary | ICD-10-CM | POA: Diagnosis not present

## 2019-04-02 DIAGNOSIS — Z9181 History of falling: Secondary | ICD-10-CM | POA: Diagnosis not present

## 2019-04-02 DIAGNOSIS — R2681 Unsteadiness on feet: Secondary | ICD-10-CM | POA: Diagnosis not present

## 2019-04-05 DIAGNOSIS — Z9181 History of falling: Secondary | ICD-10-CM | POA: Diagnosis not present

## 2019-04-05 DIAGNOSIS — R296 Repeated falls: Secondary | ICD-10-CM | POA: Diagnosis not present

## 2019-04-05 DIAGNOSIS — R278 Other lack of coordination: Secondary | ICD-10-CM | POA: Diagnosis not present

## 2019-04-05 DIAGNOSIS — M6281 Muscle weakness (generalized): Secondary | ICD-10-CM | POA: Diagnosis not present

## 2019-04-05 DIAGNOSIS — R2681 Unsteadiness on feet: Secondary | ICD-10-CM | POA: Diagnosis not present

## 2019-04-06 DIAGNOSIS — R278 Other lack of coordination: Secondary | ICD-10-CM | POA: Diagnosis not present

## 2019-04-06 DIAGNOSIS — R296 Repeated falls: Secondary | ICD-10-CM | POA: Diagnosis not present

## 2019-04-06 DIAGNOSIS — Z9181 History of falling: Secondary | ICD-10-CM | POA: Diagnosis not present

## 2019-04-06 DIAGNOSIS — M6281 Muscle weakness (generalized): Secondary | ICD-10-CM | POA: Diagnosis not present

## 2019-04-06 DIAGNOSIS — R2681 Unsteadiness on feet: Secondary | ICD-10-CM | POA: Diagnosis not present

## 2019-04-07 DIAGNOSIS — R278 Other lack of coordination: Secondary | ICD-10-CM | POA: Diagnosis not present

## 2019-04-07 DIAGNOSIS — R296 Repeated falls: Secondary | ICD-10-CM | POA: Diagnosis not present

## 2019-04-07 DIAGNOSIS — M6281 Muscle weakness (generalized): Secondary | ICD-10-CM | POA: Diagnosis not present

## 2019-04-07 DIAGNOSIS — R2681 Unsteadiness on feet: Secondary | ICD-10-CM | POA: Diagnosis not present

## 2019-04-07 DIAGNOSIS — Z9181 History of falling: Secondary | ICD-10-CM | POA: Diagnosis not present

## 2019-04-08 DIAGNOSIS — R296 Repeated falls: Secondary | ICD-10-CM | POA: Diagnosis not present

## 2019-04-08 DIAGNOSIS — Z9181 History of falling: Secondary | ICD-10-CM | POA: Diagnosis not present

## 2019-04-08 DIAGNOSIS — R278 Other lack of coordination: Secondary | ICD-10-CM | POA: Diagnosis not present

## 2019-04-08 DIAGNOSIS — R2681 Unsteadiness on feet: Secondary | ICD-10-CM | POA: Diagnosis not present

## 2019-04-08 DIAGNOSIS — M6281 Muscle weakness (generalized): Secondary | ICD-10-CM | POA: Diagnosis not present

## 2019-04-13 DIAGNOSIS — M6281 Muscle weakness (generalized): Secondary | ICD-10-CM | POA: Diagnosis not present

## 2019-04-13 DIAGNOSIS — R278 Other lack of coordination: Secondary | ICD-10-CM | POA: Diagnosis not present

## 2019-04-13 DIAGNOSIS — R2681 Unsteadiness on feet: Secondary | ICD-10-CM | POA: Diagnosis not present

## 2019-04-13 DIAGNOSIS — Z9181 History of falling: Secondary | ICD-10-CM | POA: Diagnosis not present

## 2019-04-13 DIAGNOSIS — R296 Repeated falls: Secondary | ICD-10-CM | POA: Diagnosis not present

## 2019-04-14 DIAGNOSIS — R296 Repeated falls: Secondary | ICD-10-CM | POA: Diagnosis not present

## 2019-04-14 DIAGNOSIS — Z9181 History of falling: Secondary | ICD-10-CM | POA: Diagnosis not present

## 2019-04-14 DIAGNOSIS — R278 Other lack of coordination: Secondary | ICD-10-CM | POA: Diagnosis not present

## 2019-04-14 DIAGNOSIS — M6281 Muscle weakness (generalized): Secondary | ICD-10-CM | POA: Diagnosis not present

## 2019-04-14 DIAGNOSIS — R2681 Unsteadiness on feet: Secondary | ICD-10-CM | POA: Diagnosis not present

## 2019-04-15 DIAGNOSIS — Z9181 History of falling: Secondary | ICD-10-CM | POA: Diagnosis not present

## 2019-04-15 DIAGNOSIS — R278 Other lack of coordination: Secondary | ICD-10-CM | POA: Diagnosis not present

## 2019-04-15 DIAGNOSIS — M6281 Muscle weakness (generalized): Secondary | ICD-10-CM | POA: Diagnosis not present

## 2019-04-15 DIAGNOSIS — R2681 Unsteadiness on feet: Secondary | ICD-10-CM | POA: Diagnosis not present

## 2019-04-15 DIAGNOSIS — R296 Repeated falls: Secondary | ICD-10-CM | POA: Diagnosis not present

## 2019-04-19 DIAGNOSIS — R2681 Unsteadiness on feet: Secondary | ICD-10-CM | POA: Diagnosis not present

## 2019-04-19 DIAGNOSIS — R278 Other lack of coordination: Secondary | ICD-10-CM | POA: Diagnosis not present

## 2019-04-19 DIAGNOSIS — M6281 Muscle weakness (generalized): Secondary | ICD-10-CM | POA: Diagnosis not present

## 2019-04-19 DIAGNOSIS — R296 Repeated falls: Secondary | ICD-10-CM | POA: Diagnosis not present

## 2019-04-19 DIAGNOSIS — Z9181 History of falling: Secondary | ICD-10-CM | POA: Diagnosis not present

## 2019-04-23 DIAGNOSIS — M6281 Muscle weakness (generalized): Secondary | ICD-10-CM | POA: Diagnosis not present

## 2019-04-23 DIAGNOSIS — R278 Other lack of coordination: Secondary | ICD-10-CM | POA: Diagnosis not present

## 2019-04-23 DIAGNOSIS — R296 Repeated falls: Secondary | ICD-10-CM | POA: Diagnosis not present

## 2019-04-23 DIAGNOSIS — R2681 Unsteadiness on feet: Secondary | ICD-10-CM | POA: Diagnosis not present

## 2019-04-23 DIAGNOSIS — Z9181 History of falling: Secondary | ICD-10-CM | POA: Diagnosis not present

## 2019-04-29 DIAGNOSIS — R296 Repeated falls: Secondary | ICD-10-CM | POA: Diagnosis not present

## 2019-04-29 DIAGNOSIS — M6281 Muscle weakness (generalized): Secondary | ICD-10-CM | POA: Diagnosis not present

## 2019-04-29 DIAGNOSIS — R2681 Unsteadiness on feet: Secondary | ICD-10-CM | POA: Diagnosis not present

## 2019-04-29 DIAGNOSIS — Z9181 History of falling: Secondary | ICD-10-CM | POA: Diagnosis not present

## 2019-04-29 DIAGNOSIS — R278 Other lack of coordination: Secondary | ICD-10-CM | POA: Diagnosis not present

## 2019-05-05 DIAGNOSIS — R278 Other lack of coordination: Secondary | ICD-10-CM | POA: Diagnosis not present

## 2019-05-05 DIAGNOSIS — Z9181 History of falling: Secondary | ICD-10-CM | POA: Diagnosis not present

## 2019-05-05 DIAGNOSIS — R2681 Unsteadiness on feet: Secondary | ICD-10-CM | POA: Diagnosis not present

## 2019-05-05 DIAGNOSIS — M6281 Muscle weakness (generalized): Secondary | ICD-10-CM | POA: Diagnosis not present

## 2019-05-05 DIAGNOSIS — R296 Repeated falls: Secondary | ICD-10-CM | POA: Diagnosis not present

## 2019-05-07 DIAGNOSIS — Z9181 History of falling: Secondary | ICD-10-CM | POA: Diagnosis not present

## 2019-05-07 DIAGNOSIS — R2681 Unsteadiness on feet: Secondary | ICD-10-CM | POA: Diagnosis not present

## 2019-05-07 DIAGNOSIS — R278 Other lack of coordination: Secondary | ICD-10-CM | POA: Diagnosis not present

## 2019-05-07 DIAGNOSIS — M6281 Muscle weakness (generalized): Secondary | ICD-10-CM | POA: Diagnosis not present

## 2019-05-07 DIAGNOSIS — R296 Repeated falls: Secondary | ICD-10-CM | POA: Diagnosis not present

## 2019-05-10 DIAGNOSIS — R296 Repeated falls: Secondary | ICD-10-CM | POA: Diagnosis not present

## 2019-05-10 DIAGNOSIS — M6281 Muscle weakness (generalized): Secondary | ICD-10-CM | POA: Diagnosis not present

## 2019-05-10 DIAGNOSIS — R278 Other lack of coordination: Secondary | ICD-10-CM | POA: Diagnosis not present

## 2019-05-10 DIAGNOSIS — R2681 Unsteadiness on feet: Secondary | ICD-10-CM | POA: Diagnosis not present

## 2019-05-11 DIAGNOSIS — R296 Repeated falls: Secondary | ICD-10-CM | POA: Diagnosis not present

## 2019-05-11 DIAGNOSIS — R2681 Unsteadiness on feet: Secondary | ICD-10-CM | POA: Diagnosis not present

## 2019-05-11 DIAGNOSIS — M6281 Muscle weakness (generalized): Secondary | ICD-10-CM | POA: Diagnosis not present

## 2019-05-11 DIAGNOSIS — R278 Other lack of coordination: Secondary | ICD-10-CM | POA: Diagnosis not present

## 2019-05-12 DIAGNOSIS — R296 Repeated falls: Secondary | ICD-10-CM | POA: Diagnosis not present

## 2019-05-12 DIAGNOSIS — M6281 Muscle weakness (generalized): Secondary | ICD-10-CM | POA: Diagnosis not present

## 2019-05-12 DIAGNOSIS — R278 Other lack of coordination: Secondary | ICD-10-CM | POA: Diagnosis not present

## 2019-05-12 DIAGNOSIS — R2681 Unsteadiness on feet: Secondary | ICD-10-CM | POA: Diagnosis not present

## 2019-06-30 DIAGNOSIS — I10 Essential (primary) hypertension: Secondary | ICD-10-CM | POA: Diagnosis not present

## 2019-06-30 DIAGNOSIS — R269 Unspecified abnormalities of gait and mobility: Secondary | ICD-10-CM | POA: Diagnosis not present

## 2019-06-30 DIAGNOSIS — M1990 Unspecified osteoarthritis, unspecified site: Secondary | ICD-10-CM | POA: Diagnosis not present

## 2019-06-30 DIAGNOSIS — R54 Age-related physical debility: Secondary | ICD-10-CM | POA: Diagnosis not present

## 2019-06-30 DIAGNOSIS — N183 Chronic kidney disease, stage 3 (moderate): Secondary | ICD-10-CM | POA: Diagnosis not present

## 2019-07-21 DIAGNOSIS — H906 Mixed conductive and sensorineural hearing loss, bilateral: Secondary | ICD-10-CM | POA: Insufficient documentation

## 2019-07-21 DIAGNOSIS — H7293 Unspecified perforation of tympanic membrane, bilateral: Secondary | ICD-10-CM | POA: Diagnosis not present

## 2019-07-21 DIAGNOSIS — H6123 Impacted cerumen, bilateral: Secondary | ICD-10-CM | POA: Diagnosis not present

## 2019-07-21 DIAGNOSIS — Z974 Presence of external hearing-aid: Secondary | ICD-10-CM | POA: Diagnosis not present

## 2019-09-27 DIAGNOSIS — Z1159 Encounter for screening for other viral diseases: Secondary | ICD-10-CM | POA: Diagnosis not present

## 2019-09-27 DIAGNOSIS — Z20828 Contact with and (suspected) exposure to other viral communicable diseases: Secondary | ICD-10-CM | POA: Diagnosis not present

## 2019-10-04 DIAGNOSIS — Z1159 Encounter for screening for other viral diseases: Secondary | ICD-10-CM | POA: Diagnosis not present

## 2019-10-04 DIAGNOSIS — Z20828 Contact with and (suspected) exposure to other viral communicable diseases: Secondary | ICD-10-CM | POA: Diagnosis not present

## 2019-10-11 DIAGNOSIS — Z20828 Contact with and (suspected) exposure to other viral communicable diseases: Secondary | ICD-10-CM | POA: Diagnosis not present

## 2019-10-11 DIAGNOSIS — Z1159 Encounter for screening for other viral diseases: Secondary | ICD-10-CM | POA: Diagnosis not present

## 2019-10-18 DIAGNOSIS — R2681 Unsteadiness on feet: Secondary | ICD-10-CM | POA: Diagnosis not present

## 2019-10-18 DIAGNOSIS — M6281 Muscle weakness (generalized): Secondary | ICD-10-CM | POA: Diagnosis not present

## 2019-10-18 DIAGNOSIS — R278 Other lack of coordination: Secondary | ICD-10-CM | POA: Diagnosis not present

## 2019-10-18 DIAGNOSIS — R2689 Other abnormalities of gait and mobility: Secondary | ICD-10-CM | POA: Diagnosis not present

## 2019-10-20 DIAGNOSIS — R2689 Other abnormalities of gait and mobility: Secondary | ICD-10-CM | POA: Diagnosis not present

## 2019-10-20 DIAGNOSIS — M6281 Muscle weakness (generalized): Secondary | ICD-10-CM | POA: Diagnosis not present

## 2019-10-20 DIAGNOSIS — R278 Other lack of coordination: Secondary | ICD-10-CM | POA: Diagnosis not present

## 2019-10-20 DIAGNOSIS — R2681 Unsteadiness on feet: Secondary | ICD-10-CM | POA: Diagnosis not present

## 2019-10-21 DIAGNOSIS — Z1159 Encounter for screening for other viral diseases: Secondary | ICD-10-CM | POA: Diagnosis not present

## 2019-10-21 DIAGNOSIS — Z20828 Contact with and (suspected) exposure to other viral communicable diseases: Secondary | ICD-10-CM | POA: Diagnosis not present

## 2019-10-22 DIAGNOSIS — R278 Other lack of coordination: Secondary | ICD-10-CM | POA: Diagnosis not present

## 2019-10-22 DIAGNOSIS — M6281 Muscle weakness (generalized): Secondary | ICD-10-CM | POA: Diagnosis not present

## 2019-10-22 DIAGNOSIS — R2681 Unsteadiness on feet: Secondary | ICD-10-CM | POA: Diagnosis not present

## 2019-10-22 DIAGNOSIS — R2689 Other abnormalities of gait and mobility: Secondary | ICD-10-CM | POA: Diagnosis not present

## 2019-10-25 DIAGNOSIS — Z20828 Contact with and (suspected) exposure to other viral communicable diseases: Secondary | ICD-10-CM | POA: Diagnosis not present

## 2019-10-25 DIAGNOSIS — Z1159 Encounter for screening for other viral diseases: Secondary | ICD-10-CM | POA: Diagnosis not present

## 2019-10-25 DIAGNOSIS — R2689 Other abnormalities of gait and mobility: Secondary | ICD-10-CM | POA: Diagnosis not present

## 2019-10-25 DIAGNOSIS — M6281 Muscle weakness (generalized): Secondary | ICD-10-CM | POA: Diagnosis not present

## 2019-10-25 DIAGNOSIS — R278 Other lack of coordination: Secondary | ICD-10-CM | POA: Diagnosis not present

## 2019-10-25 DIAGNOSIS — R2681 Unsteadiness on feet: Secondary | ICD-10-CM | POA: Diagnosis not present

## 2019-10-27 DIAGNOSIS — R2681 Unsteadiness on feet: Secondary | ICD-10-CM | POA: Diagnosis not present

## 2019-10-27 DIAGNOSIS — M6281 Muscle weakness (generalized): Secondary | ICD-10-CM | POA: Diagnosis not present

## 2019-10-27 DIAGNOSIS — R2689 Other abnormalities of gait and mobility: Secondary | ICD-10-CM | POA: Diagnosis not present

## 2019-10-27 DIAGNOSIS — R278 Other lack of coordination: Secondary | ICD-10-CM | POA: Diagnosis not present

## 2019-10-28 DIAGNOSIS — Z23 Encounter for immunization: Secondary | ICD-10-CM | POA: Diagnosis not present

## 2019-11-01 DIAGNOSIS — R2689 Other abnormalities of gait and mobility: Secondary | ICD-10-CM | POA: Diagnosis not present

## 2019-11-01 DIAGNOSIS — R2681 Unsteadiness on feet: Secondary | ICD-10-CM | POA: Diagnosis not present

## 2019-11-01 DIAGNOSIS — R278 Other lack of coordination: Secondary | ICD-10-CM | POA: Diagnosis not present

## 2019-11-01 DIAGNOSIS — Z1159 Encounter for screening for other viral diseases: Secondary | ICD-10-CM | POA: Diagnosis not present

## 2019-11-01 DIAGNOSIS — Z20828 Contact with and (suspected) exposure to other viral communicable diseases: Secondary | ICD-10-CM | POA: Diagnosis not present

## 2019-11-01 DIAGNOSIS — M6281 Muscle weakness (generalized): Secondary | ICD-10-CM | POA: Diagnosis not present

## 2019-11-03 DIAGNOSIS — R2689 Other abnormalities of gait and mobility: Secondary | ICD-10-CM | POA: Diagnosis not present

## 2019-11-03 DIAGNOSIS — M6281 Muscle weakness (generalized): Secondary | ICD-10-CM | POA: Diagnosis not present

## 2019-11-03 DIAGNOSIS — R2681 Unsteadiness on feet: Secondary | ICD-10-CM | POA: Diagnosis not present

## 2019-11-03 DIAGNOSIS — R278 Other lack of coordination: Secondary | ICD-10-CM | POA: Diagnosis not present

## 2019-11-08 DIAGNOSIS — R278 Other lack of coordination: Secondary | ICD-10-CM | POA: Diagnosis not present

## 2019-11-08 DIAGNOSIS — Z20828 Contact with and (suspected) exposure to other viral communicable diseases: Secondary | ICD-10-CM | POA: Diagnosis not present

## 2019-11-08 DIAGNOSIS — R2681 Unsteadiness on feet: Secondary | ICD-10-CM | POA: Diagnosis not present

## 2019-11-08 DIAGNOSIS — Z1159 Encounter for screening for other viral diseases: Secondary | ICD-10-CM | POA: Diagnosis not present

## 2019-11-08 DIAGNOSIS — M6281 Muscle weakness (generalized): Secondary | ICD-10-CM | POA: Diagnosis not present

## 2019-11-08 DIAGNOSIS — R2689 Other abnormalities of gait and mobility: Secondary | ICD-10-CM | POA: Diagnosis not present

## 2019-11-11 DIAGNOSIS — R2689 Other abnormalities of gait and mobility: Secondary | ICD-10-CM | POA: Diagnosis not present

## 2019-11-11 DIAGNOSIS — M6281 Muscle weakness (generalized): Secondary | ICD-10-CM | POA: Diagnosis not present

## 2019-11-11 DIAGNOSIS — R278 Other lack of coordination: Secondary | ICD-10-CM | POA: Diagnosis not present

## 2019-11-11 DIAGNOSIS — R2681 Unsteadiness on feet: Secondary | ICD-10-CM | POA: Diagnosis not present

## 2019-11-15 DIAGNOSIS — Z20828 Contact with and (suspected) exposure to other viral communicable diseases: Secondary | ICD-10-CM | POA: Diagnosis not present

## 2019-11-15 DIAGNOSIS — Z1159 Encounter for screening for other viral diseases: Secondary | ICD-10-CM | POA: Diagnosis not present

## 2019-11-15 DIAGNOSIS — R2689 Other abnormalities of gait and mobility: Secondary | ICD-10-CM | POA: Diagnosis not present

## 2019-11-15 DIAGNOSIS — R278 Other lack of coordination: Secondary | ICD-10-CM | POA: Diagnosis not present

## 2019-11-15 DIAGNOSIS — R2681 Unsteadiness on feet: Secondary | ICD-10-CM | POA: Diagnosis not present

## 2019-11-15 DIAGNOSIS — M6281 Muscle weakness (generalized): Secondary | ICD-10-CM | POA: Diagnosis not present

## 2019-11-17 DIAGNOSIS — R2689 Other abnormalities of gait and mobility: Secondary | ICD-10-CM | POA: Diagnosis not present

## 2019-11-17 DIAGNOSIS — R278 Other lack of coordination: Secondary | ICD-10-CM | POA: Diagnosis not present

## 2019-11-17 DIAGNOSIS — R2681 Unsteadiness on feet: Secondary | ICD-10-CM | POA: Diagnosis not present

## 2019-11-17 DIAGNOSIS — M6281 Muscle weakness (generalized): Secondary | ICD-10-CM | POA: Diagnosis not present

## 2019-11-22 DIAGNOSIS — R2681 Unsteadiness on feet: Secondary | ICD-10-CM | POA: Diagnosis not present

## 2019-11-22 DIAGNOSIS — Z20828 Contact with and (suspected) exposure to other viral communicable diseases: Secondary | ICD-10-CM | POA: Diagnosis not present

## 2019-11-22 DIAGNOSIS — M6281 Muscle weakness (generalized): Secondary | ICD-10-CM | POA: Diagnosis not present

## 2019-11-22 DIAGNOSIS — Z1159 Encounter for screening for other viral diseases: Secondary | ICD-10-CM | POA: Diagnosis not present

## 2019-11-22 DIAGNOSIS — R278 Other lack of coordination: Secondary | ICD-10-CM | POA: Diagnosis not present

## 2019-11-22 DIAGNOSIS — R2689 Other abnormalities of gait and mobility: Secondary | ICD-10-CM | POA: Diagnosis not present

## 2019-11-24 DIAGNOSIS — R2681 Unsteadiness on feet: Secondary | ICD-10-CM | POA: Diagnosis not present

## 2019-11-24 DIAGNOSIS — R2689 Other abnormalities of gait and mobility: Secondary | ICD-10-CM | POA: Diagnosis not present

## 2019-11-24 DIAGNOSIS — M6281 Muscle weakness (generalized): Secondary | ICD-10-CM | POA: Diagnosis not present

## 2019-11-24 DIAGNOSIS — R278 Other lack of coordination: Secondary | ICD-10-CM | POA: Diagnosis not present

## 2019-11-25 DIAGNOSIS — Z23 Encounter for immunization: Secondary | ICD-10-CM | POA: Diagnosis not present

## 2019-11-29 DIAGNOSIS — Z20828 Contact with and (suspected) exposure to other viral communicable diseases: Secondary | ICD-10-CM | POA: Diagnosis not present

## 2019-11-29 DIAGNOSIS — M6281 Muscle weakness (generalized): Secondary | ICD-10-CM | POA: Diagnosis not present

## 2019-11-29 DIAGNOSIS — R2681 Unsteadiness on feet: Secondary | ICD-10-CM | POA: Diagnosis not present

## 2019-11-29 DIAGNOSIS — Z1159 Encounter for screening for other viral diseases: Secondary | ICD-10-CM | POA: Diagnosis not present

## 2019-11-29 DIAGNOSIS — R278 Other lack of coordination: Secondary | ICD-10-CM | POA: Diagnosis not present

## 2019-11-29 DIAGNOSIS — R2689 Other abnormalities of gait and mobility: Secondary | ICD-10-CM | POA: Diagnosis not present

## 2019-12-01 DIAGNOSIS — R2689 Other abnormalities of gait and mobility: Secondary | ICD-10-CM | POA: Diagnosis not present

## 2019-12-01 DIAGNOSIS — R2681 Unsteadiness on feet: Secondary | ICD-10-CM | POA: Diagnosis not present

## 2019-12-01 DIAGNOSIS — R278 Other lack of coordination: Secondary | ICD-10-CM | POA: Diagnosis not present

## 2019-12-01 DIAGNOSIS — M6281 Muscle weakness (generalized): Secondary | ICD-10-CM | POA: Diagnosis not present

## 2019-12-06 DIAGNOSIS — Z20828 Contact with and (suspected) exposure to other viral communicable diseases: Secondary | ICD-10-CM | POA: Diagnosis not present

## 2019-12-06 DIAGNOSIS — Z1159 Encounter for screening for other viral diseases: Secondary | ICD-10-CM | POA: Diagnosis not present

## 2019-12-06 DIAGNOSIS — M6281 Muscle weakness (generalized): Secondary | ICD-10-CM | POA: Diagnosis not present

## 2019-12-06 DIAGNOSIS — R2681 Unsteadiness on feet: Secondary | ICD-10-CM | POA: Diagnosis not present

## 2019-12-06 DIAGNOSIS — R278 Other lack of coordination: Secondary | ICD-10-CM | POA: Diagnosis not present

## 2019-12-06 DIAGNOSIS — R2689 Other abnormalities of gait and mobility: Secondary | ICD-10-CM | POA: Diagnosis not present

## 2019-12-08 DIAGNOSIS — R278 Other lack of coordination: Secondary | ICD-10-CM | POA: Diagnosis not present

## 2019-12-08 DIAGNOSIS — R2681 Unsteadiness on feet: Secondary | ICD-10-CM | POA: Diagnosis not present

## 2019-12-08 DIAGNOSIS — M6281 Muscle weakness (generalized): Secondary | ICD-10-CM | POA: Diagnosis not present

## 2019-12-08 DIAGNOSIS — R2689 Other abnormalities of gait and mobility: Secondary | ICD-10-CM | POA: Diagnosis not present

## 2019-12-13 DIAGNOSIS — Z20828 Contact with and (suspected) exposure to other viral communicable diseases: Secondary | ICD-10-CM | POA: Diagnosis not present

## 2019-12-13 DIAGNOSIS — Z1159 Encounter for screening for other viral diseases: Secondary | ICD-10-CM | POA: Diagnosis not present

## 2019-12-14 DIAGNOSIS — M6281 Muscle weakness (generalized): Secondary | ICD-10-CM | POA: Diagnosis not present

## 2019-12-14 DIAGNOSIS — R2681 Unsteadiness on feet: Secondary | ICD-10-CM | POA: Diagnosis not present

## 2019-12-14 DIAGNOSIS — R278 Other lack of coordination: Secondary | ICD-10-CM | POA: Diagnosis not present

## 2019-12-14 DIAGNOSIS — R2689 Other abnormalities of gait and mobility: Secondary | ICD-10-CM | POA: Diagnosis not present

## 2019-12-16 DIAGNOSIS — M6281 Muscle weakness (generalized): Secondary | ICD-10-CM | POA: Diagnosis not present

## 2019-12-16 DIAGNOSIS — R278 Other lack of coordination: Secondary | ICD-10-CM | POA: Diagnosis not present

## 2019-12-16 DIAGNOSIS — R2681 Unsteadiness on feet: Secondary | ICD-10-CM | POA: Diagnosis not present

## 2019-12-16 DIAGNOSIS — R2689 Other abnormalities of gait and mobility: Secondary | ICD-10-CM | POA: Diagnosis not present

## 2019-12-20 DIAGNOSIS — Z20828 Contact with and (suspected) exposure to other viral communicable diseases: Secondary | ICD-10-CM | POA: Diagnosis not present

## 2019-12-20 DIAGNOSIS — R2689 Other abnormalities of gait and mobility: Secondary | ICD-10-CM | POA: Diagnosis not present

## 2019-12-20 DIAGNOSIS — M6281 Muscle weakness (generalized): Secondary | ICD-10-CM | POA: Diagnosis not present

## 2019-12-20 DIAGNOSIS — Z1159 Encounter for screening for other viral diseases: Secondary | ICD-10-CM | POA: Diagnosis not present

## 2019-12-20 DIAGNOSIS — R2681 Unsteadiness on feet: Secondary | ICD-10-CM | POA: Diagnosis not present

## 2019-12-20 DIAGNOSIS — R278 Other lack of coordination: Secondary | ICD-10-CM | POA: Diagnosis not present

## 2019-12-23 DIAGNOSIS — R278 Other lack of coordination: Secondary | ICD-10-CM | POA: Diagnosis not present

## 2019-12-23 DIAGNOSIS — R2681 Unsteadiness on feet: Secondary | ICD-10-CM | POA: Diagnosis not present

## 2019-12-23 DIAGNOSIS — M6281 Muscle weakness (generalized): Secondary | ICD-10-CM | POA: Diagnosis not present

## 2019-12-23 DIAGNOSIS — R2689 Other abnormalities of gait and mobility: Secondary | ICD-10-CM | POA: Diagnosis not present

## 2019-12-27 DIAGNOSIS — Z20828 Contact with and (suspected) exposure to other viral communicable diseases: Secondary | ICD-10-CM | POA: Diagnosis not present

## 2019-12-27 DIAGNOSIS — Z1159 Encounter for screening for other viral diseases: Secondary | ICD-10-CM | POA: Diagnosis not present

## 2019-12-28 DIAGNOSIS — M6281 Muscle weakness (generalized): Secondary | ICD-10-CM | POA: Diagnosis not present

## 2019-12-28 DIAGNOSIS — R2681 Unsteadiness on feet: Secondary | ICD-10-CM | POA: Diagnosis not present

## 2019-12-28 DIAGNOSIS — R2689 Other abnormalities of gait and mobility: Secondary | ICD-10-CM | POA: Diagnosis not present

## 2019-12-28 DIAGNOSIS — R278 Other lack of coordination: Secondary | ICD-10-CM | POA: Diagnosis not present

## 2019-12-30 DIAGNOSIS — M6281 Muscle weakness (generalized): Secondary | ICD-10-CM | POA: Diagnosis not present

## 2019-12-30 DIAGNOSIS — R2681 Unsteadiness on feet: Secondary | ICD-10-CM | POA: Diagnosis not present

## 2019-12-30 DIAGNOSIS — R278 Other lack of coordination: Secondary | ICD-10-CM | POA: Diagnosis not present

## 2019-12-30 DIAGNOSIS — R2689 Other abnormalities of gait and mobility: Secondary | ICD-10-CM | POA: Diagnosis not present

## 2020-01-03 DIAGNOSIS — Z1159 Encounter for screening for other viral diseases: Secondary | ICD-10-CM | POA: Diagnosis not present

## 2020-01-03 DIAGNOSIS — Z20828 Contact with and (suspected) exposure to other viral communicable diseases: Secondary | ICD-10-CM | POA: Diagnosis not present

## 2020-01-10 DIAGNOSIS — Z1159 Encounter for screening for other viral diseases: Secondary | ICD-10-CM | POA: Diagnosis not present

## 2020-01-10 DIAGNOSIS — Z20828 Contact with and (suspected) exposure to other viral communicable diseases: Secondary | ICD-10-CM | POA: Diagnosis not present

## 2020-01-17 DIAGNOSIS — Z1159 Encounter for screening for other viral diseases: Secondary | ICD-10-CM | POA: Diagnosis not present

## 2020-01-17 DIAGNOSIS — Z20828 Contact with and (suspected) exposure to other viral communicable diseases: Secondary | ICD-10-CM | POA: Diagnosis not present

## 2020-01-24 DIAGNOSIS — Z20828 Contact with and (suspected) exposure to other viral communicable diseases: Secondary | ICD-10-CM | POA: Diagnosis not present

## 2020-01-24 DIAGNOSIS — Z1159 Encounter for screening for other viral diseases: Secondary | ICD-10-CM | POA: Diagnosis not present

## 2020-01-31 DIAGNOSIS — Z20828 Contact with and (suspected) exposure to other viral communicable diseases: Secondary | ICD-10-CM | POA: Diagnosis not present

## 2020-01-31 DIAGNOSIS — Z1159 Encounter for screening for other viral diseases: Secondary | ICD-10-CM | POA: Diagnosis not present

## 2020-02-07 DIAGNOSIS — Z20828 Contact with and (suspected) exposure to other viral communicable diseases: Secondary | ICD-10-CM | POA: Diagnosis not present

## 2020-02-07 DIAGNOSIS — Z1159 Encounter for screening for other viral diseases: Secondary | ICD-10-CM | POA: Diagnosis not present

## 2020-02-09 DIAGNOSIS — H6123 Impacted cerumen, bilateral: Secondary | ICD-10-CM | POA: Diagnosis not present

## 2020-02-09 DIAGNOSIS — H906 Mixed conductive and sensorineural hearing loss, bilateral: Secondary | ICD-10-CM | POA: Diagnosis not present

## 2020-02-09 DIAGNOSIS — H7293 Unspecified perforation of tympanic membrane, bilateral: Secondary | ICD-10-CM | POA: Diagnosis not present

## 2020-02-09 DIAGNOSIS — Z974 Presence of external hearing-aid: Secondary | ICD-10-CM | POA: Diagnosis not present

## 2020-02-14 DIAGNOSIS — Z1159 Encounter for screening for other viral diseases: Secondary | ICD-10-CM | POA: Diagnosis not present

## 2020-02-14 DIAGNOSIS — Z20828 Contact with and (suspected) exposure to other viral communicable diseases: Secondary | ICD-10-CM | POA: Diagnosis not present

## 2020-02-21 DIAGNOSIS — Z1159 Encounter for screening for other viral diseases: Secondary | ICD-10-CM | POA: Diagnosis not present

## 2020-02-21 DIAGNOSIS — Z20828 Contact with and (suspected) exposure to other viral communicable diseases: Secondary | ICD-10-CM | POA: Diagnosis not present

## 2020-02-28 DIAGNOSIS — Z1159 Encounter for screening for other viral diseases: Secondary | ICD-10-CM | POA: Diagnosis not present

## 2020-02-28 DIAGNOSIS — Z20828 Contact with and (suspected) exposure to other viral communicable diseases: Secondary | ICD-10-CM | POA: Diagnosis not present

## 2020-02-29 DIAGNOSIS — Z961 Presence of intraocular lens: Secondary | ICD-10-CM | POA: Diagnosis not present

## 2020-02-29 DIAGNOSIS — H26493 Other secondary cataract, bilateral: Secondary | ICD-10-CM | POA: Diagnosis not present

## 2020-02-29 DIAGNOSIS — H524 Presbyopia: Secondary | ICD-10-CM | POA: Diagnosis not present

## 2020-03-06 DIAGNOSIS — Z1159 Encounter for screening for other viral diseases: Secondary | ICD-10-CM | POA: Diagnosis not present

## 2020-03-06 DIAGNOSIS — Z20828 Contact with and (suspected) exposure to other viral communicable diseases: Secondary | ICD-10-CM | POA: Diagnosis not present

## 2020-03-07 ENCOUNTER — Other Ambulatory Visit: Payer: Self-pay | Admitting: Family Medicine

## 2020-03-07 ENCOUNTER — Ambulatory Visit
Admission: RE | Admit: 2020-03-07 | Discharge: 2020-03-07 | Disposition: A | Discharge status: Home / Self Care | Payer: Medicare Other | Source: Ambulatory Visit | Attending: Family Medicine | Admitting: Family Medicine

## 2020-03-07 DIAGNOSIS — R296 Repeated falls: Secondary | ICD-10-CM

## 2020-03-07 DIAGNOSIS — R8290 Unspecified abnormal findings in urine: Secondary | ICD-10-CM | POA: Diagnosis not present

## 2020-03-07 DIAGNOSIS — S0990XA Unspecified injury of head, initial encounter: Secondary | ICD-10-CM | POA: Diagnosis not present

## 2020-03-11 ENCOUNTER — Emergency Department (HOSPITAL_COMMUNITY)
Admission: EM | Admit: 2020-03-11 | Discharge: 2020-03-11 | Disposition: A | Discharge status: Home / Self Care | Payer: Medicare Other | Attending: Emergency Medicine | Admitting: Emergency Medicine

## 2020-03-11 ENCOUNTER — Other Ambulatory Visit: Payer: Self-pay

## 2020-03-11 ENCOUNTER — Encounter (HOSPITAL_COMMUNITY): Payer: Self-pay

## 2020-03-11 DIAGNOSIS — R531 Weakness: Secondary | ICD-10-CM | POA: Diagnosis not present

## 2020-03-11 DIAGNOSIS — E860 Dehydration: Secondary | ICD-10-CM

## 2020-03-11 DIAGNOSIS — W19XXXA Unspecified fall, initial encounter: Secondary | ICD-10-CM | POA: Diagnosis not present

## 2020-03-11 DIAGNOSIS — R1111 Vomiting without nausea: Secondary | ICD-10-CM | POA: Diagnosis present

## 2020-03-11 DIAGNOSIS — Z85828 Personal history of other malignant neoplasm of skin: Secondary | ICD-10-CM | POA: Insufficient documentation

## 2020-03-11 DIAGNOSIS — Z79899 Other long term (current) drug therapy: Secondary | ICD-10-CM | POA: Diagnosis not present

## 2020-03-11 DIAGNOSIS — R0902 Hypoxemia: Secondary | ICD-10-CM | POA: Diagnosis not present

## 2020-03-11 DIAGNOSIS — I10 Essential (primary) hypertension: Secondary | ICD-10-CM | POA: Diagnosis not present

## 2020-03-11 DIAGNOSIS — R1110 Vomiting, unspecified: Secondary | ICD-10-CM | POA: Diagnosis not present

## 2020-03-11 LAB — CBC WITH DIFFERENTIAL/PLATELET
Abs Immature Granulocytes: 0.01 10*3/uL (ref 0.00–0.07)
Basophils Absolute: 0 10*3/uL (ref 0.0–0.1)
Basophils Relative: 1 %
Eosinophils Absolute: 0.1 10*3/uL (ref 0.0–0.5)
Eosinophils Relative: 1 %
HCT: 38.5 % (ref 36.0–46.0)
Hemoglobin: 12.5 g/dL (ref 12.0–15.0)
Immature Granulocytes: 0 %
Lymphocytes Relative: 22 %
Lymphs Abs: 1.6 10*3/uL (ref 0.7–4.0)
MCH: 31.3 pg (ref 26.0–34.0)
MCHC: 32.5 g/dL (ref 30.0–36.0)
MCV: 96.3 fL (ref 80.0–100.0)
Monocytes Absolute: 0.6 10*3/uL (ref 0.1–1.0)
Monocytes Relative: 9 %
Neutro Abs: 4.8 10*3/uL (ref 1.7–7.7)
Neutrophils Relative %: 67 %
Platelets: 357 10*3/uL (ref 150–400)
RBC: 4 MIL/uL (ref 3.87–5.11)
RDW: 13.5 % (ref 11.5–15.5)
WBC: 7.1 10*3/uL (ref 4.0–10.5)
nRBC: 0 % (ref 0.0–0.2)

## 2020-03-11 LAB — BASIC METABOLIC PANEL
Anion gap: 12 (ref 5–15)
BUN: 24 mg/dL — ABNORMAL HIGH (ref 8–23)
CO2: 25 mmol/L (ref 22–32)
Calcium: 9.7 mg/dL (ref 8.9–10.3)
Chloride: 103 mmol/L (ref 98–111)
Creatinine, Ser: 1.12 mg/dL — ABNORMAL HIGH (ref 0.44–1.00)
GFR calc Af Amer: 47 mL/min — ABNORMAL LOW (ref >60)
GFR calc non Af Amer: 41 mL/min — ABNORMAL LOW (ref >60)
Glucose, Bld: 113 mg/dL — ABNORMAL HIGH (ref 70–99)
Potassium: 3.4 mmol/L — ABNORMAL LOW (ref 3.5–5.1)
Sodium: 140 mmol/L (ref 135–145)

## 2020-03-11 MED ORDER — ONDANSETRON HCL 4 MG PO TABS: 4.0000 mg | ORAL_TABLET | Freq: Three times a day (TID) | ORAL | 0 refills | Status: AC | PRN

## 2020-03-11 MED ORDER — SODIUM CHLORIDE 0.9 % IV BOLUS
500.0000 mL | Freq: Once | INTRAVENOUS | Status: AC
  Administered 2020-03-11: 500 mL via INTRAVENOUS

## 2020-03-11 MED ORDER — ONDANSETRON HCL 4 MG/2ML IJ SOLN
4.0000 mg | Freq: Once | INTRAMUSCULAR | Status: DC
  Filled 2020-03-11: qty 2

## 2020-03-11 NOTE — Discharge Instructions (Signed)
You are seen in the emergency department for evaluation of vomiting.  You had blood work that did not show any significant abnormalities.  Please follow-up with your regular doctor.  Return to the emergency department if any worsening or concerning symptoms.

## 2020-03-11 NOTE — ED Notes (Signed)
Pt tolerating po fluids/liquids without any N/V.

## 2020-03-11 NOTE — ED Provider Notes (Signed)
[facility] DEPT Provider Note   CSN: [id number] Arrival date & time: 03/11/20  1728     History Chief Complaint  Patient presents with  . Emesis    [name] is a 86 y.o. female.  She is a resident at [facility].  Reportedly by EMS was found outside with vomit on her clothing.  Patient states she vomited once yesterday and vomited once today.  No blood.  She denies any complaints and states she is hungry.  She does not know why she vomited.  She is asking to be sent back home.  The history is provided by the patient and the EMS personnel.  Emesis Severity:  Moderate Duration:  2 days Number of daily episodes:  Once Quality:  Stomach contents Progression:  Resolved Chronicity:  New Relieved by:  None tried Worsened by:  Nothing Ineffective treatments:  None tried Associated symptoms: no abdominal pain, no cough, no diarrhea, no fever, no headaches and no sore throat        Past Medical History:  Diagnosis Date  . Cancer of the skin, basal cell 2008   FACE  . Degenerative joint disease (DJD) of hip   . Depression   . Gait disorder   . Hypertension   . Hypokalemia   . Iron deficiency anemia   . Osteoporosis   . Pelvic fracture [facility])     Patient Active Problem List   Diagnosis Date Noted  . Normocytic anemia 01/04/2019  . Leukocytosis 01/02/2019  . Pubic ramus fracture ([place]) 01/02/2019  . Hip pain 01/02/2019  . Essential hypertension 01/02/2019  . Hypokalemia 07/09/2017  . Subdural hematoma ([place]) 07/08/2017  . Frequent falls 07/08/2017  . Acute lower UTI 07/08/2017  . Acute metabolic encephalopathy 53/97/6734  . Fall     Past Surgical History:  Procedure Laterality Date  . CATARACT EXTRACTION, BILATERAL    . HIP FRACTURE SURGERY  09/2004   DR. WHITFIELD  . NERVE ENTRAPMENT     LEFT THUMB  . SPINAL FUSION  1996   L3-4 [facility]  . WRIST FRACTURE SURGERY     DR. Daylene Katayama     OB History   No obstetric  history on file.     Family History  Problem Relation Age of Onset  . Heart failure Mother   . Hypertension Father   . Diabetes Sister     Social History   Tobacco Use  . Smoking status: Never Smoker  . Smokeless tobacco: Never Used  Substance Use Topics  . Alcohol use: No  . Drug use: No    Home Medications Prior to Admission medications   Medication Sig Start Date End Date Taking? Authorizing Provider  acetaminophen (TYLENOL) 650 MG CR tablet Take 650 mg by mouth every 8 (eight) hours as needed for pain.    [provider]  amLODipine (NORVASC) 2.5 MG tablet Take 2.5 mg by mouth daily.    [provider]  Calcium Carbonate-Vitamin D (CALTRATE 600+D) 600-400 MG-UNIT per tablet Take 1 tablet by mouth every evening.     [provider]  cephALEXin (KEFLEX) 250 MG capsule Take 1 capsule (250 mg total) by mouth 3 (three) times daily. 01/04/19   Norval Morton, MD  HYDROcodone-acetaminophen (NORCO/VICODIN) 5-325 MG tablet Take 1-2 tablets by mouth every 6 (six) hours as needed for moderate pain. 01/04/19   Norval Morton, MD  Multiple Vitamin (MULTIVITAMIN WITH MINERALS) TABS tablet Take 1 tablet by mouth daily.  [provider]    Allergies    Dicloxacillin, Other, Pork-derived products, and Septra [sulfamethoxazole-trimethoprim]  Review of Systems   Review of Systems  Constitutional: Negative for fever.  HENT: Negative for sore throat.   Eyes: Negative for visual disturbance.  Respiratory: Negative for cough and shortness of breath.   Cardiovascular: Negative for chest pain.  Gastrointestinal: Positive for vomiting. Negative for abdominal pain and diarrhea.  Genitourinary: Negative for dysuria.  Musculoskeletal: Negative for neck pain.  Skin: Negative for rash.  Neurological: Negative for headaches.    Physical Exam Updated Vital Signs BP (!) 165/71   Pulse 69   Temp 98.1 F (36.7 C)   Resp 20   SpO2 93%   Physical  Exam Vitals and nursing note reviewed.  Constitutional:      General: She is not in acute distress.    Appearance: She is well-developed.  HENT:     Head: Normocephalic and atraumatic.  Eyes:     Conjunctiva/sclera: Conjunctivae normal.  Cardiovascular:     Rate and Rhythm: Normal rate and regular rhythm.     Heart sounds: No murmur.  Pulmonary:     Effort: Pulmonary effort is normal. No respiratory distress.     Breath sounds: Normal breath sounds.  Abdominal:     Palpations: Abdomen is soft.     Tenderness: There is no abdominal tenderness. There is no guarding or rebound.  Musculoskeletal:        General: Normal range of motion.     Cervical back: Neck supple.     Right lower leg: No edema.     Left lower leg: No edema.  Skin:    General: Skin is warm and dry.     Capillary Refill: Capillary refill takes less than 2 seconds.  Neurological:     General: No focal deficit present.     Mental Status: She is alert. Mental status is at baseline.     ED Results / Procedures / Treatments   Labs (all labs ordered are listed, but only abnormal results are displayed) Labs Reviewed  BASIC METABOLIC PANEL - Abnormal; Notable for the following components:      Result Value   Potassium 3.4 (*)    Glucose, Bld 113 (*)    BUN 24 (*)    Creatinine, Ser 1.12 (*)    GFR calc non Af Amer 41 (*)    GFR calc Af Amer 47 (*)    All other components within normal limits  CBC WITH DIFFERENTIAL/PLATELET    EKG EKG Interpretation  Date/Time:  Saturday March 11 2020 17:58:14 EDT Ventricular Rate:  67 PR Interval:    QRS Duration: 98 QT Interval:  433 QTC Calculation: 458 R Axis:   -52 Text Interpretation: Sinus rhythm Left anterior fascicular block Abnormal R-wave progression, late transition Left ventricular hypertrophy No significant change since prior 12/11 Confirmed by Aletta Edouard [id number]) on 03/11/2020 6:15:58 PM   Radiology No results found.  Procedures Procedures  (including critical care time)  Medications Ordered in ED Medications  sodium chloride 0.9 % bolus 500 mL (0 mLs Intravenous Stopped 03/11/20 2119)    ED Course  I have reviewed the triage vital signs and the nursing notes.  Pertinent labs & imaging results that were available during my care of the patient were reviewed by me and considered in my medical decision making (see chart for details).  Clinical Course as of Mar 12 946  Sat Mar 11, 2020  1949 Patient's  daughter here now.  States patient had fallen few weeks ago had a negative CT.  Since then she has been off.  PCP is treating her for a urinary tract infection with doxy.  Today patient was out in the heat wearing lots of clothing when she began experiencing vomiting.  Currently patient feels fine and is eating a sandwich.  Lab work showing creatinine to be slightly up from baseline.  Getting some IV fluids.  Daughter asked if we could recheck her urine.   [MB]  2106 Patient's daughter now does not want to wait on getting the urine.  The patient has not urinated here.  Patient is refusing an in and out catheter.  Recommended that the patient follow-up with her primary care doctor and they can check a urine there.  Return instructions discussed   [MB]    Clinical Course User Index [MB] Hayden Rasmussen, MD   MDM Rules/Calculators/A&P                     This patient complains of vomiting; this involves an extensive number of treatment Options and is a complaint that carries with it a high risk of complications and Morbidity. The differential includes infection, obstruction, metabolic derangement  I ordered, reviewed and interpreted labs, which included CBC showing normal white count normal hemoglobin chemistries fairly unremarkable other than a mildly low potassium and slightly elevated creatinine from baseline.  Ordered a urine but the patient was unable to produce. I ordered medication IV fluids. Additional history obtained from  patient's daughter.  She was concerned that the patient may have not fully cleared her urinary tract infection that she was recently diagnosed with.  Is still currently on antibiotics.  Pure wick is placed and patient did not urinate while she was in the department.  I requested an in and out catheter but the patient refused.  Daughter is comfortable with her going back to her facility and following up with her doctor.  Return instructions discussed. Previous records obtained and reviewed in epic  After the interventions stated above, I reevaluated the patient and found patient to be hemodynamically stable.  She has no complaints.  Final Clinical Impression(s) / ED Diagnoses Final diagnoses:  Non-intractable vomiting, presence of nausea not specified, unspecified vomiting type  Dehydration    Rx / DC Orders ED Discharge Orders         Ordered    ondansetron (ZOFRAN) 4 MG tablet  Every 8 hours PRN     03/11/20 2107           Hayden Rasmussen, MD 03/12/20 [phone]

## 2020-03-11 NOTE — ED Triage Notes (Signed)
Per EMS, Pt was found outside following an emesis episode. Staff said pt was more lethargic than normal, but pt alert and oriented. Unknown how many episodes of emesis, and no blood noted in vomit.

## 2020-03-13 DIAGNOSIS — Z1159 Encounter for screening for other viral diseases: Secondary | ICD-10-CM | POA: Diagnosis not present

## 2020-03-13 DIAGNOSIS — Z20828 Contact with and (suspected) exposure to other viral communicable diseases: Secondary | ICD-10-CM | POA: Diagnosis not present

## 2020-04-06 DIAGNOSIS — R260 Ataxic gait: Secondary | ICD-10-CM | POA: Diagnosis not present

## 2020-04-06 DIAGNOSIS — M6281 Muscle weakness (generalized): Secondary | ICD-10-CM | POA: Diagnosis not present

## 2020-04-06 DIAGNOSIS — R278 Other lack of coordination: Secondary | ICD-10-CM | POA: Diagnosis not present

## 2020-04-06 DIAGNOSIS — R296 Repeated falls: Secondary | ICD-10-CM | POA: Diagnosis not present

## 2020-04-06 DIAGNOSIS — R2689 Other abnormalities of gait and mobility: Secondary | ICD-10-CM | POA: Diagnosis not present

## 2020-04-06 DIAGNOSIS — R2681 Unsteadiness on feet: Secondary | ICD-10-CM | POA: Diagnosis not present

## 2020-04-11 DIAGNOSIS — M6281 Muscle weakness (generalized): Secondary | ICD-10-CM | POA: Diagnosis not present

## 2020-04-11 DIAGNOSIS — R2689 Other abnormalities of gait and mobility: Secondary | ICD-10-CM | POA: Diagnosis not present

## 2020-04-11 DIAGNOSIS — R296 Repeated falls: Secondary | ICD-10-CM | POA: Diagnosis not present

## 2020-04-11 DIAGNOSIS — R260 Ataxic gait: Secondary | ICD-10-CM | POA: Diagnosis not present

## 2020-04-11 DIAGNOSIS — R278 Other lack of coordination: Secondary | ICD-10-CM | POA: Diagnosis not present

## 2020-04-11 DIAGNOSIS — R2681 Unsteadiness on feet: Secondary | ICD-10-CM | POA: Diagnosis not present

## 2020-04-12 DIAGNOSIS — M6281 Muscle weakness (generalized): Secondary | ICD-10-CM | POA: Diagnosis not present

## 2020-04-12 DIAGNOSIS — R2689 Other abnormalities of gait and mobility: Secondary | ICD-10-CM | POA: Diagnosis not present

## 2020-04-12 DIAGNOSIS — R296 Repeated falls: Secondary | ICD-10-CM | POA: Diagnosis not present

## 2020-04-12 DIAGNOSIS — R260 Ataxic gait: Secondary | ICD-10-CM | POA: Diagnosis not present

## 2020-04-12 DIAGNOSIS — R278 Other lack of coordination: Secondary | ICD-10-CM | POA: Diagnosis not present

## 2020-04-12 DIAGNOSIS — R2681 Unsteadiness on feet: Secondary | ICD-10-CM | POA: Diagnosis not present

## 2020-04-13 DIAGNOSIS — R296 Repeated falls: Secondary | ICD-10-CM | POA: Diagnosis not present

## 2020-04-13 DIAGNOSIS — R278 Other lack of coordination: Secondary | ICD-10-CM | POA: Diagnosis not present

## 2020-04-13 DIAGNOSIS — M6281 Muscle weakness (generalized): Secondary | ICD-10-CM | POA: Diagnosis not present

## 2020-04-13 DIAGNOSIS — R260 Ataxic gait: Secondary | ICD-10-CM | POA: Diagnosis not present

## 2020-04-13 DIAGNOSIS — R2689 Other abnormalities of gait and mobility: Secondary | ICD-10-CM | POA: Diagnosis not present

## 2020-04-13 DIAGNOSIS — R2681 Unsteadiness on feet: Secondary | ICD-10-CM | POA: Diagnosis not present

## 2020-04-14 DIAGNOSIS — R278 Other lack of coordination: Secondary | ICD-10-CM | POA: Diagnosis not present

## 2020-04-14 DIAGNOSIS — R296 Repeated falls: Secondary | ICD-10-CM | POA: Diagnosis not present

## 2020-04-14 DIAGNOSIS — R260 Ataxic gait: Secondary | ICD-10-CM | POA: Diagnosis not present

## 2020-04-14 DIAGNOSIS — R2681 Unsteadiness on feet: Secondary | ICD-10-CM | POA: Diagnosis not present

## 2020-04-14 DIAGNOSIS — R2689 Other abnormalities of gait and mobility: Secondary | ICD-10-CM | POA: Diagnosis not present

## 2020-04-14 DIAGNOSIS — M6281 Muscle weakness (generalized): Secondary | ICD-10-CM | POA: Diagnosis not present

## 2020-04-17 DIAGNOSIS — R278 Other lack of coordination: Secondary | ICD-10-CM | POA: Diagnosis not present

## 2020-04-17 DIAGNOSIS — R260 Ataxic gait: Secondary | ICD-10-CM | POA: Diagnosis not present

## 2020-04-17 DIAGNOSIS — R2681 Unsteadiness on feet: Secondary | ICD-10-CM | POA: Diagnosis not present

## 2020-04-17 DIAGNOSIS — Z20828 Contact with and (suspected) exposure to other viral communicable diseases: Secondary | ICD-10-CM | POA: Diagnosis not present

## 2020-04-17 DIAGNOSIS — R2689 Other abnormalities of gait and mobility: Secondary | ICD-10-CM | POA: Diagnosis not present

## 2020-04-17 DIAGNOSIS — R296 Repeated falls: Secondary | ICD-10-CM | POA: Diagnosis not present

## 2020-04-17 DIAGNOSIS — Z1159 Encounter for screening for other viral diseases: Secondary | ICD-10-CM | POA: Diagnosis not present

## 2020-04-17 DIAGNOSIS — M6281 Muscle weakness (generalized): Secondary | ICD-10-CM | POA: Diagnosis not present

## 2020-04-19 DIAGNOSIS — M6281 Muscle weakness (generalized): Secondary | ICD-10-CM | POA: Diagnosis not present

## 2020-04-19 DIAGNOSIS — R2689 Other abnormalities of gait and mobility: Secondary | ICD-10-CM | POA: Diagnosis not present

## 2020-04-19 DIAGNOSIS — R2681 Unsteadiness on feet: Secondary | ICD-10-CM | POA: Diagnosis not present

## 2020-04-19 DIAGNOSIS — R260 Ataxic gait: Secondary | ICD-10-CM | POA: Diagnosis not present

## 2020-04-19 DIAGNOSIS — R278 Other lack of coordination: Secondary | ICD-10-CM | POA: Diagnosis not present

## 2020-04-19 DIAGNOSIS — R296 Repeated falls: Secondary | ICD-10-CM | POA: Diagnosis not present

## 2020-04-21 DIAGNOSIS — R296 Repeated falls: Secondary | ICD-10-CM | POA: Diagnosis not present

## 2020-04-21 DIAGNOSIS — R260 Ataxic gait: Secondary | ICD-10-CM | POA: Diagnosis not present

## 2020-04-21 DIAGNOSIS — R278 Other lack of coordination: Secondary | ICD-10-CM | POA: Diagnosis not present

## 2020-04-21 DIAGNOSIS — R2681 Unsteadiness on feet: Secondary | ICD-10-CM | POA: Diagnosis not present

## 2020-04-21 DIAGNOSIS — M6281 Muscle weakness (generalized): Secondary | ICD-10-CM | POA: Diagnosis not present

## 2020-04-21 DIAGNOSIS — R2689 Other abnormalities of gait and mobility: Secondary | ICD-10-CM | POA: Diagnosis not present

## 2020-04-24 DIAGNOSIS — Z20828 Contact with and (suspected) exposure to other viral communicable diseases: Secondary | ICD-10-CM | POA: Diagnosis not present

## 2020-04-24 DIAGNOSIS — Z1159 Encounter for screening for other viral diseases: Secondary | ICD-10-CM | POA: Diagnosis not present

## 2020-04-25 DIAGNOSIS — R296 Repeated falls: Secondary | ICD-10-CM | POA: Diagnosis not present

## 2020-04-25 DIAGNOSIS — R2681 Unsteadiness on feet: Secondary | ICD-10-CM | POA: Diagnosis not present

## 2020-04-25 DIAGNOSIS — M6281 Muscle weakness (generalized): Secondary | ICD-10-CM | POA: Diagnosis not present

## 2020-04-25 DIAGNOSIS — R278 Other lack of coordination: Secondary | ICD-10-CM | POA: Diagnosis not present

## 2020-04-25 DIAGNOSIS — R2689 Other abnormalities of gait and mobility: Secondary | ICD-10-CM | POA: Diagnosis not present

## 2020-04-25 DIAGNOSIS — R260 Ataxic gait: Secondary | ICD-10-CM | POA: Diagnosis not present

## 2020-04-26 DIAGNOSIS — R2681 Unsteadiness on feet: Secondary | ICD-10-CM | POA: Diagnosis not present

## 2020-04-26 DIAGNOSIS — M6281 Muscle weakness (generalized): Secondary | ICD-10-CM | POA: Diagnosis not present

## 2020-04-26 DIAGNOSIS — R260 Ataxic gait: Secondary | ICD-10-CM | POA: Diagnosis not present

## 2020-04-26 DIAGNOSIS — R278 Other lack of coordination: Secondary | ICD-10-CM | POA: Diagnosis not present

## 2020-04-26 DIAGNOSIS — R2689 Other abnormalities of gait and mobility: Secondary | ICD-10-CM | POA: Diagnosis not present

## 2020-04-26 DIAGNOSIS — R296 Repeated falls: Secondary | ICD-10-CM | POA: Diagnosis not present

## 2020-04-27 DIAGNOSIS — R278 Other lack of coordination: Secondary | ICD-10-CM | POA: Diagnosis not present

## 2020-04-27 DIAGNOSIS — R2689 Other abnormalities of gait and mobility: Secondary | ICD-10-CM | POA: Diagnosis not present

## 2020-04-27 DIAGNOSIS — M6281 Muscle weakness (generalized): Secondary | ICD-10-CM | POA: Diagnosis not present

## 2020-04-27 DIAGNOSIS — R2681 Unsteadiness on feet: Secondary | ICD-10-CM | POA: Diagnosis not present

## 2020-04-27 DIAGNOSIS — R260 Ataxic gait: Secondary | ICD-10-CM | POA: Diagnosis not present

## 2020-04-27 DIAGNOSIS — R296 Repeated falls: Secondary | ICD-10-CM | POA: Diagnosis not present

## 2020-05-01 DIAGNOSIS — Z1159 Encounter for screening for other viral diseases: Secondary | ICD-10-CM | POA: Diagnosis not present

## 2020-05-01 DIAGNOSIS — Z20828 Contact with and (suspected) exposure to other viral communicable diseases: Secondary | ICD-10-CM | POA: Diagnosis not present

## 2020-05-02 DIAGNOSIS — R260 Ataxic gait: Secondary | ICD-10-CM | POA: Diagnosis not present

## 2020-05-02 DIAGNOSIS — M6281 Muscle weakness (generalized): Secondary | ICD-10-CM | POA: Diagnosis not present

## 2020-05-02 DIAGNOSIS — R278 Other lack of coordination: Secondary | ICD-10-CM | POA: Diagnosis not present

## 2020-05-02 DIAGNOSIS — R2681 Unsteadiness on feet: Secondary | ICD-10-CM | POA: Diagnosis not present

## 2020-05-02 DIAGNOSIS — R2689 Other abnormalities of gait and mobility: Secondary | ICD-10-CM | POA: Diagnosis not present

## 2020-05-02 DIAGNOSIS — R296 Repeated falls: Secondary | ICD-10-CM | POA: Diagnosis not present

## 2020-05-04 DIAGNOSIS — R296 Repeated falls: Secondary | ICD-10-CM | POA: Diagnosis not present

## 2020-05-04 DIAGNOSIS — R2689 Other abnormalities of gait and mobility: Secondary | ICD-10-CM | POA: Diagnosis not present

## 2020-05-04 DIAGNOSIS — R260 Ataxic gait: Secondary | ICD-10-CM | POA: Diagnosis not present

## 2020-05-04 DIAGNOSIS — R2681 Unsteadiness on feet: Secondary | ICD-10-CM | POA: Diagnosis not present

## 2020-05-04 DIAGNOSIS — M6281 Muscle weakness (generalized): Secondary | ICD-10-CM | POA: Diagnosis not present

## 2020-05-04 DIAGNOSIS — R278 Other lack of coordination: Secondary | ICD-10-CM | POA: Diagnosis not present

## 2020-05-08 DIAGNOSIS — R260 Ataxic gait: Secondary | ICD-10-CM | POA: Diagnosis not present

## 2020-05-08 DIAGNOSIS — R2681 Unsteadiness on feet: Secondary | ICD-10-CM | POA: Diagnosis not present

## 2020-05-08 DIAGNOSIS — R296 Repeated falls: Secondary | ICD-10-CM | POA: Diagnosis not present

## 2020-05-08 DIAGNOSIS — R2689 Other abnormalities of gait and mobility: Secondary | ICD-10-CM | POA: Diagnosis not present

## 2020-05-08 DIAGNOSIS — Z1159 Encounter for screening for other viral diseases: Secondary | ICD-10-CM | POA: Diagnosis not present

## 2020-05-08 DIAGNOSIS — Z20828 Contact with and (suspected) exposure to other viral communicable diseases: Secondary | ICD-10-CM | POA: Diagnosis not present

## 2020-05-10 DIAGNOSIS — R2681 Unsteadiness on feet: Secondary | ICD-10-CM | POA: Diagnosis not present

## 2020-05-10 DIAGNOSIS — R296 Repeated falls: Secondary | ICD-10-CM | POA: Diagnosis not present

## 2020-05-10 DIAGNOSIS — R260 Ataxic gait: Secondary | ICD-10-CM | POA: Diagnosis not present

## 2020-05-10 DIAGNOSIS — R2689 Other abnormalities of gait and mobility: Secondary | ICD-10-CM | POA: Diagnosis not present

## 2020-05-15 DIAGNOSIS — Z20828 Contact with and (suspected) exposure to other viral communicable diseases: Secondary | ICD-10-CM | POA: Diagnosis not present

## 2020-05-15 DIAGNOSIS — Z1159 Encounter for screening for other viral diseases: Secondary | ICD-10-CM | POA: Diagnosis not present

## 2020-05-16 DIAGNOSIS — R296 Repeated falls: Secondary | ICD-10-CM | POA: Diagnosis not present

## 2020-05-16 DIAGNOSIS — R2681 Unsteadiness on feet: Secondary | ICD-10-CM | POA: Diagnosis not present

## 2020-05-16 DIAGNOSIS — R2689 Other abnormalities of gait and mobility: Secondary | ICD-10-CM | POA: Diagnosis not present

## 2020-05-16 DIAGNOSIS — R260 Ataxic gait: Secondary | ICD-10-CM | POA: Diagnosis not present

## 2020-05-22 DIAGNOSIS — Z20828 Contact with and (suspected) exposure to other viral communicable diseases: Secondary | ICD-10-CM | POA: Diagnosis not present

## 2020-05-22 DIAGNOSIS — Z1159 Encounter for screening for other viral diseases: Secondary | ICD-10-CM | POA: Diagnosis not present

## 2020-05-29 DIAGNOSIS — Z20828 Contact with and (suspected) exposure to other viral communicable diseases: Secondary | ICD-10-CM | POA: Diagnosis not present

## 2020-05-29 DIAGNOSIS — Z1159 Encounter for screening for other viral diseases: Secondary | ICD-10-CM | POA: Diagnosis not present

## 2020-06-05 DIAGNOSIS — Z1159 Encounter for screening for other viral diseases: Secondary | ICD-10-CM | POA: Diagnosis not present

## 2020-06-05 DIAGNOSIS — Z20828 Contact with and (suspected) exposure to other viral communicable diseases: Secondary | ICD-10-CM | POA: Diagnosis not present

## 2020-06-12 DIAGNOSIS — Z1159 Encounter for screening for other viral diseases: Secondary | ICD-10-CM | POA: Diagnosis not present

## 2020-06-12 DIAGNOSIS — Z20828 Contact with and (suspected) exposure to other viral communicable diseases: Secondary | ICD-10-CM | POA: Diagnosis not present

## 2020-06-15 DIAGNOSIS — Z1159 Encounter for screening for other viral diseases: Secondary | ICD-10-CM | POA: Diagnosis not present

## 2020-06-15 DIAGNOSIS — Z20828 Contact with and (suspected) exposure to other viral communicable diseases: Secondary | ICD-10-CM | POA: Diagnosis not present

## 2020-06-19 DIAGNOSIS — Z20828 Contact with and (suspected) exposure to other viral communicable diseases: Secondary | ICD-10-CM | POA: Diagnosis not present

## 2020-06-19 DIAGNOSIS — Z1159 Encounter for screening for other viral diseases: Secondary | ICD-10-CM | POA: Diagnosis not present

## 2020-06-26 DIAGNOSIS — Z20828 Contact with and (suspected) exposure to other viral communicable diseases: Secondary | ICD-10-CM | POA: Diagnosis not present

## 2020-06-26 DIAGNOSIS — Z1159 Encounter for screening for other viral diseases: Secondary | ICD-10-CM | POA: Diagnosis not present

## 2020-07-03 DIAGNOSIS — Z1159 Encounter for screening for other viral diseases: Secondary | ICD-10-CM | POA: Diagnosis not present

## 2020-07-03 DIAGNOSIS — Z20828 Contact with and (suspected) exposure to other viral communicable diseases: Secondary | ICD-10-CM | POA: Diagnosis not present

## 2020-07-10 DIAGNOSIS — Z20828 Contact with and (suspected) exposure to other viral communicable diseases: Secondary | ICD-10-CM | POA: Diagnosis not present

## 2020-07-10 DIAGNOSIS — Z1159 Encounter for screening for other viral diseases: Secondary | ICD-10-CM | POA: Diagnosis not present

## 2020-07-17 DIAGNOSIS — Z20828 Contact with and (suspected) exposure to other viral communicable diseases: Secondary | ICD-10-CM | POA: Diagnosis not present

## 2020-07-17 DIAGNOSIS — Z1159 Encounter for screening for other viral diseases: Secondary | ICD-10-CM | POA: Diagnosis not present

## 2020-07-24 DIAGNOSIS — Z1159 Encounter for screening for other viral diseases: Secondary | ICD-10-CM | POA: Diagnosis not present

## 2020-07-24 DIAGNOSIS — Z20828 Contact with and (suspected) exposure to other viral communicable diseases: Secondary | ICD-10-CM | POA: Diagnosis not present

## 2020-07-31 DIAGNOSIS — Z1159 Encounter for screening for other viral diseases: Secondary | ICD-10-CM | POA: Diagnosis not present

## 2020-07-31 DIAGNOSIS — Z20828 Contact with and (suspected) exposure to other viral communicable diseases: Secondary | ICD-10-CM | POA: Diagnosis not present

## 2020-08-07 DIAGNOSIS — Z20828 Contact with and (suspected) exposure to other viral communicable diseases: Secondary | ICD-10-CM | POA: Diagnosis not present

## 2020-08-07 DIAGNOSIS — Z1159 Encounter for screening for other viral diseases: Secondary | ICD-10-CM | POA: Diagnosis not present

## 2020-08-14 DIAGNOSIS — Z1159 Encounter for screening for other viral diseases: Secondary | ICD-10-CM | POA: Diagnosis not present

## 2020-08-14 DIAGNOSIS — Z20828 Contact with and (suspected) exposure to other viral communicable diseases: Secondary | ICD-10-CM | POA: Diagnosis not present

## 2020-08-16 DIAGNOSIS — Z23 Encounter for immunization: Secondary | ICD-10-CM | POA: Diagnosis not present

## 2020-08-21 DIAGNOSIS — Z20828 Contact with and (suspected) exposure to other viral communicable diseases: Secondary | ICD-10-CM | POA: Diagnosis not present

## 2020-08-21 DIAGNOSIS — Z1159 Encounter for screening for other viral diseases: Secondary | ICD-10-CM | POA: Diagnosis not present

## 2020-08-28 DIAGNOSIS — Z1159 Encounter for screening for other viral diseases: Secondary | ICD-10-CM | POA: Diagnosis not present

## 2020-08-28 DIAGNOSIS — Z20828 Contact with and (suspected) exposure to other viral communicable diseases: Secondary | ICD-10-CM | POA: Diagnosis not present

## 2020-09-04 DIAGNOSIS — Z20828 Contact with and (suspected) exposure to other viral communicable diseases: Secondary | ICD-10-CM | POA: Diagnosis not present

## 2020-09-04 DIAGNOSIS — Z1159 Encounter for screening for other viral diseases: Secondary | ICD-10-CM | POA: Diagnosis not present

## 2020-09-04 DIAGNOSIS — H6123 Impacted cerumen, bilateral: Secondary | ICD-10-CM | POA: Diagnosis not present

## 2020-09-11 DIAGNOSIS — Z1159 Encounter for screening for other viral diseases: Secondary | ICD-10-CM | POA: Diagnosis not present

## 2020-09-11 DIAGNOSIS — Z20828 Contact with and (suspected) exposure to other viral communicable diseases: Secondary | ICD-10-CM | POA: Diagnosis not present

## 2020-09-18 DIAGNOSIS — Z1159 Encounter for screening for other viral diseases: Secondary | ICD-10-CM | POA: Diagnosis not present

## 2020-09-18 DIAGNOSIS — Z20828 Contact with and (suspected) exposure to other viral communicable diseases: Secondary | ICD-10-CM | POA: Diagnosis not present

## 2020-09-25 DIAGNOSIS — Z1159 Encounter for screening for other viral diseases: Secondary | ICD-10-CM | POA: Diagnosis not present

## 2020-09-25 DIAGNOSIS — Z20828 Contact with and (suspected) exposure to other viral communicable diseases: Secondary | ICD-10-CM | POA: Diagnosis not present

## 2020-10-02 DIAGNOSIS — Z20828 Contact with and (suspected) exposure to other viral communicable diseases: Secondary | ICD-10-CM | POA: Diagnosis not present

## 2020-10-02 DIAGNOSIS — Z1159 Encounter for screening for other viral diseases: Secondary | ICD-10-CM | POA: Diagnosis not present

## 2020-10-09 DIAGNOSIS — Z1159 Encounter for screening for other viral diseases: Secondary | ICD-10-CM | POA: Diagnosis not present

## 2020-10-09 DIAGNOSIS — Z20828 Contact with and (suspected) exposure to other viral communicable diseases: Secondary | ICD-10-CM | POA: Diagnosis not present

## 2020-10-23 DIAGNOSIS — Z20828 Contact with and (suspected) exposure to other viral communicable diseases: Secondary | ICD-10-CM | POA: Diagnosis not present

## 2020-10-23 DIAGNOSIS — Z1159 Encounter for screening for other viral diseases: Secondary | ICD-10-CM | POA: Diagnosis not present

## 2020-10-30 DIAGNOSIS — Z20828 Contact with and (suspected) exposure to other viral communicable diseases: Secondary | ICD-10-CM | POA: Diagnosis not present

## 2020-10-30 DIAGNOSIS — Z1159 Encounter for screening for other viral diseases: Secondary | ICD-10-CM | POA: Diagnosis not present

## 2020-11-06 DIAGNOSIS — Z1159 Encounter for screening for other viral diseases: Secondary | ICD-10-CM | POA: Diagnosis not present

## 2020-11-06 DIAGNOSIS — Z20828 Contact with and (suspected) exposure to other viral communicable diseases: Secondary | ICD-10-CM | POA: Diagnosis not present

## 2020-11-13 DIAGNOSIS — Z20828 Contact with and (suspected) exposure to other viral communicable diseases: Secondary | ICD-10-CM | POA: Diagnosis not present

## 2020-11-13 DIAGNOSIS — Z1159 Encounter for screening for other viral diseases: Secondary | ICD-10-CM | POA: Diagnosis not present

## 2020-11-20 DIAGNOSIS — Z1159 Encounter for screening for other viral diseases: Secondary | ICD-10-CM | POA: Diagnosis not present

## 2020-11-20 DIAGNOSIS — Z20828 Contact with and (suspected) exposure to other viral communicable diseases: Secondary | ICD-10-CM | POA: Diagnosis not present

## 2020-11-27 DIAGNOSIS — Z1159 Encounter for screening for other viral diseases: Secondary | ICD-10-CM | POA: Diagnosis not present

## 2020-11-27 DIAGNOSIS — Z20828 Contact with and (suspected) exposure to other viral communicable diseases: Secondary | ICD-10-CM | POA: Diagnosis not present

## 2020-12-04 DIAGNOSIS — Z1159 Encounter for screening for other viral diseases: Secondary | ICD-10-CM | POA: Diagnosis not present

## 2020-12-04 DIAGNOSIS — Z20828 Contact with and (suspected) exposure to other viral communicable diseases: Secondary | ICD-10-CM | POA: Diagnosis not present

## 2020-12-07 DIAGNOSIS — R296 Repeated falls: Secondary | ICD-10-CM | POA: Diagnosis not present

## 2020-12-07 DIAGNOSIS — R2689 Other abnormalities of gait and mobility: Secondary | ICD-10-CM | POA: Diagnosis not present

## 2020-12-07 DIAGNOSIS — M6281 Muscle weakness (generalized): Secondary | ICD-10-CM | POA: Diagnosis not present

## 2020-12-07 DIAGNOSIS — R2681 Unsteadiness on feet: Secondary | ICD-10-CM | POA: Diagnosis not present

## 2020-12-08 DIAGNOSIS — R296 Repeated falls: Secondary | ICD-10-CM | POA: Diagnosis not present

## 2020-12-08 DIAGNOSIS — R2681 Unsteadiness on feet: Secondary | ICD-10-CM | POA: Diagnosis not present

## 2020-12-08 DIAGNOSIS — R2689 Other abnormalities of gait and mobility: Secondary | ICD-10-CM | POA: Diagnosis not present

## 2020-12-08 DIAGNOSIS — M6281 Muscle weakness (generalized): Secondary | ICD-10-CM | POA: Diagnosis not present

## 2020-12-11 DIAGNOSIS — Z20828 Contact with and (suspected) exposure to other viral communicable diseases: Secondary | ICD-10-CM | POA: Diagnosis not present

## 2020-12-11 DIAGNOSIS — M6281 Muscle weakness (generalized): Secondary | ICD-10-CM | POA: Diagnosis not present

## 2020-12-11 DIAGNOSIS — R296 Repeated falls: Secondary | ICD-10-CM | POA: Diagnosis not present

## 2020-12-11 DIAGNOSIS — Z1159 Encounter for screening for other viral diseases: Secondary | ICD-10-CM | POA: Diagnosis not present

## 2020-12-11 DIAGNOSIS — R2681 Unsteadiness on feet: Secondary | ICD-10-CM | POA: Diagnosis not present

## 2020-12-11 DIAGNOSIS — R2689 Other abnormalities of gait and mobility: Secondary | ICD-10-CM | POA: Diagnosis not present

## 2020-12-12 DIAGNOSIS — R296 Repeated falls: Secondary | ICD-10-CM | POA: Diagnosis not present

## 2020-12-12 DIAGNOSIS — R2681 Unsteadiness on feet: Secondary | ICD-10-CM | POA: Diagnosis not present

## 2020-12-12 DIAGNOSIS — R2689 Other abnormalities of gait and mobility: Secondary | ICD-10-CM | POA: Diagnosis not present

## 2020-12-12 DIAGNOSIS — M6281 Muscle weakness (generalized): Secondary | ICD-10-CM | POA: Diagnosis not present

## 2020-12-13 DIAGNOSIS — M6281 Muscle weakness (generalized): Secondary | ICD-10-CM | POA: Diagnosis not present

## 2020-12-13 DIAGNOSIS — R296 Repeated falls: Secondary | ICD-10-CM | POA: Diagnosis not present

## 2020-12-13 DIAGNOSIS — R2681 Unsteadiness on feet: Secondary | ICD-10-CM | POA: Diagnosis not present

## 2020-12-13 DIAGNOSIS — R2689 Other abnormalities of gait and mobility: Secondary | ICD-10-CM | POA: Diagnosis not present

## 2020-12-18 DIAGNOSIS — Z1159 Encounter for screening for other viral diseases: Secondary | ICD-10-CM | POA: Diagnosis not present

## 2020-12-18 DIAGNOSIS — Z20828 Contact with and (suspected) exposure to other viral communicable diseases: Secondary | ICD-10-CM | POA: Diagnosis not present

## 2020-12-19 DIAGNOSIS — R296 Repeated falls: Secondary | ICD-10-CM | POA: Diagnosis not present

## 2020-12-19 DIAGNOSIS — M6281 Muscle weakness (generalized): Secondary | ICD-10-CM | POA: Diagnosis not present

## 2020-12-19 DIAGNOSIS — R2681 Unsteadiness on feet: Secondary | ICD-10-CM | POA: Diagnosis not present

## 2020-12-19 DIAGNOSIS — R2689 Other abnormalities of gait and mobility: Secondary | ICD-10-CM | POA: Diagnosis not present

## 2020-12-21 DIAGNOSIS — R2689 Other abnormalities of gait and mobility: Secondary | ICD-10-CM | POA: Diagnosis not present

## 2020-12-21 DIAGNOSIS — R296 Repeated falls: Secondary | ICD-10-CM | POA: Diagnosis not present

## 2020-12-21 DIAGNOSIS — M6281 Muscle weakness (generalized): Secondary | ICD-10-CM | POA: Diagnosis not present

## 2020-12-21 DIAGNOSIS — R2681 Unsteadiness on feet: Secondary | ICD-10-CM | POA: Diagnosis not present

## 2020-12-22 DIAGNOSIS — M6281 Muscle weakness (generalized): Secondary | ICD-10-CM | POA: Diagnosis not present

## 2020-12-22 DIAGNOSIS — R2681 Unsteadiness on feet: Secondary | ICD-10-CM | POA: Diagnosis not present

## 2020-12-22 DIAGNOSIS — R296 Repeated falls: Secondary | ICD-10-CM | POA: Diagnosis not present

## 2020-12-22 DIAGNOSIS — R2689 Other abnormalities of gait and mobility: Secondary | ICD-10-CM | POA: Diagnosis not present

## 2020-12-25 DIAGNOSIS — Z1159 Encounter for screening for other viral diseases: Secondary | ICD-10-CM | POA: Diagnosis not present

## 2020-12-25 DIAGNOSIS — Z20828 Contact with and (suspected) exposure to other viral communicable diseases: Secondary | ICD-10-CM | POA: Diagnosis not present

## 2020-12-26 DIAGNOSIS — M6281 Muscle weakness (generalized): Secondary | ICD-10-CM | POA: Diagnosis not present

## 2020-12-26 DIAGNOSIS — R296 Repeated falls: Secondary | ICD-10-CM | POA: Diagnosis not present

## 2020-12-26 DIAGNOSIS — R2681 Unsteadiness on feet: Secondary | ICD-10-CM | POA: Diagnosis not present

## 2020-12-26 DIAGNOSIS — R2689 Other abnormalities of gait and mobility: Secondary | ICD-10-CM | POA: Diagnosis not present

## 2020-12-27 DIAGNOSIS — R2681 Unsteadiness on feet: Secondary | ICD-10-CM | POA: Diagnosis not present

## 2020-12-27 DIAGNOSIS — R2689 Other abnormalities of gait and mobility: Secondary | ICD-10-CM | POA: Diagnosis not present

## 2020-12-27 DIAGNOSIS — M6281 Muscle weakness (generalized): Secondary | ICD-10-CM | POA: Diagnosis not present

## 2020-12-27 DIAGNOSIS — R296 Repeated falls: Secondary | ICD-10-CM | POA: Diagnosis not present

## 2020-12-29 DIAGNOSIS — R2689 Other abnormalities of gait and mobility: Secondary | ICD-10-CM | POA: Diagnosis not present

## 2020-12-29 DIAGNOSIS — R2681 Unsteadiness on feet: Secondary | ICD-10-CM | POA: Diagnosis not present

## 2020-12-29 DIAGNOSIS — R296 Repeated falls: Secondary | ICD-10-CM | POA: Diagnosis not present

## 2020-12-29 DIAGNOSIS — M6281 Muscle weakness (generalized): Secondary | ICD-10-CM | POA: Diagnosis not present

## 2021-01-01 DIAGNOSIS — R2681 Unsteadiness on feet: Secondary | ICD-10-CM | POA: Diagnosis not present

## 2021-01-01 DIAGNOSIS — M6281 Muscle weakness (generalized): Secondary | ICD-10-CM | POA: Diagnosis not present

## 2021-01-01 DIAGNOSIS — Z20828 Contact with and (suspected) exposure to other viral communicable diseases: Secondary | ICD-10-CM | POA: Diagnosis not present

## 2021-01-01 DIAGNOSIS — Z1159 Encounter for screening for other viral diseases: Secondary | ICD-10-CM | POA: Diagnosis not present

## 2021-01-01 DIAGNOSIS — R296 Repeated falls: Secondary | ICD-10-CM | POA: Diagnosis not present

## 2021-01-01 DIAGNOSIS — R2689 Other abnormalities of gait and mobility: Secondary | ICD-10-CM | POA: Diagnosis not present

## 2021-01-02 DIAGNOSIS — R2689 Other abnormalities of gait and mobility: Secondary | ICD-10-CM | POA: Diagnosis not present

## 2021-01-02 DIAGNOSIS — R2681 Unsteadiness on feet: Secondary | ICD-10-CM | POA: Diagnosis not present

## 2021-01-02 DIAGNOSIS — M6281 Muscle weakness (generalized): Secondary | ICD-10-CM | POA: Diagnosis not present

## 2021-01-02 DIAGNOSIS — R296 Repeated falls: Secondary | ICD-10-CM | POA: Diagnosis not present

## 2021-01-08 DIAGNOSIS — R2689 Other abnormalities of gait and mobility: Secondary | ICD-10-CM | POA: Diagnosis not present

## 2021-01-08 DIAGNOSIS — R2681 Unsteadiness on feet: Secondary | ICD-10-CM | POA: Diagnosis not present

## 2021-01-08 DIAGNOSIS — R296 Repeated falls: Secondary | ICD-10-CM | POA: Diagnosis not present

## 2021-01-08 DIAGNOSIS — Z1159 Encounter for screening for other viral diseases: Secondary | ICD-10-CM | POA: Diagnosis not present

## 2021-01-08 DIAGNOSIS — Z20828 Contact with and (suspected) exposure to other viral communicable diseases: Secondary | ICD-10-CM | POA: Diagnosis not present

## 2021-01-08 DIAGNOSIS — M6281 Muscle weakness (generalized): Secondary | ICD-10-CM | POA: Diagnosis not present

## 2021-01-10 DIAGNOSIS — R2689 Other abnormalities of gait and mobility: Secondary | ICD-10-CM | POA: Diagnosis not present

## 2021-01-10 DIAGNOSIS — R296 Repeated falls: Secondary | ICD-10-CM | POA: Diagnosis not present

## 2021-01-10 DIAGNOSIS — R2681 Unsteadiness on feet: Secondary | ICD-10-CM | POA: Diagnosis not present

## 2021-01-10 DIAGNOSIS — M6281 Muscle weakness (generalized): Secondary | ICD-10-CM | POA: Diagnosis not present

## 2021-01-15 DIAGNOSIS — Z1159 Encounter for screening for other viral diseases: Secondary | ICD-10-CM | POA: Diagnosis not present

## 2021-01-15 DIAGNOSIS — Z20828 Contact with and (suspected) exposure to other viral communicable diseases: Secondary | ICD-10-CM | POA: Diagnosis not present

## 2021-01-17 DIAGNOSIS — R2681 Unsteadiness on feet: Secondary | ICD-10-CM | POA: Diagnosis not present

## 2021-01-17 DIAGNOSIS — M6281 Muscle weakness (generalized): Secondary | ICD-10-CM | POA: Diagnosis not present

## 2021-01-17 DIAGNOSIS — R2689 Other abnormalities of gait and mobility: Secondary | ICD-10-CM | POA: Diagnosis not present

## 2021-01-17 DIAGNOSIS — R296 Repeated falls: Secondary | ICD-10-CM | POA: Diagnosis not present

## 2021-01-19 DIAGNOSIS — R2689 Other abnormalities of gait and mobility: Secondary | ICD-10-CM | POA: Diagnosis not present

## 2021-01-19 DIAGNOSIS — R296 Repeated falls: Secondary | ICD-10-CM | POA: Diagnosis not present

## 2021-01-19 DIAGNOSIS — R2681 Unsteadiness on feet: Secondary | ICD-10-CM | POA: Diagnosis not present

## 2021-01-19 DIAGNOSIS — M6281 Muscle weakness (generalized): Secondary | ICD-10-CM | POA: Diagnosis not present

## 2021-01-22 DIAGNOSIS — R296 Repeated falls: Secondary | ICD-10-CM | POA: Diagnosis not present

## 2021-01-22 DIAGNOSIS — Z1159 Encounter for screening for other viral diseases: Secondary | ICD-10-CM | POA: Diagnosis not present

## 2021-01-22 DIAGNOSIS — R2681 Unsteadiness on feet: Secondary | ICD-10-CM | POA: Diagnosis not present

## 2021-01-22 DIAGNOSIS — R2689 Other abnormalities of gait and mobility: Secondary | ICD-10-CM | POA: Diagnosis not present

## 2021-01-22 DIAGNOSIS — M6281 Muscle weakness (generalized): Secondary | ICD-10-CM | POA: Diagnosis not present

## 2021-01-22 DIAGNOSIS — Z20828 Contact with and (suspected) exposure to other viral communicable diseases: Secondary | ICD-10-CM | POA: Diagnosis not present

## 2021-01-24 DIAGNOSIS — R2681 Unsteadiness on feet: Secondary | ICD-10-CM | POA: Diagnosis not present

## 2021-01-24 DIAGNOSIS — R2689 Other abnormalities of gait and mobility: Secondary | ICD-10-CM | POA: Diagnosis not present

## 2021-01-24 DIAGNOSIS — R296 Repeated falls: Secondary | ICD-10-CM | POA: Diagnosis not present

## 2021-01-24 DIAGNOSIS — M6281 Muscle weakness (generalized): Secondary | ICD-10-CM | POA: Diagnosis not present

## 2021-01-29 DIAGNOSIS — M6281 Muscle weakness (generalized): Secondary | ICD-10-CM | POA: Diagnosis not present

## 2021-01-29 DIAGNOSIS — R2681 Unsteadiness on feet: Secondary | ICD-10-CM | POA: Diagnosis not present

## 2021-01-29 DIAGNOSIS — R296 Repeated falls: Secondary | ICD-10-CM | POA: Diagnosis not present

## 2021-01-29 DIAGNOSIS — R2689 Other abnormalities of gait and mobility: Secondary | ICD-10-CM | POA: Diagnosis not present

## 2021-01-29 DIAGNOSIS — Z20828 Contact with and (suspected) exposure to other viral communicable diseases: Secondary | ICD-10-CM | POA: Diagnosis not present

## 2021-01-29 DIAGNOSIS — Z1159 Encounter for screening for other viral diseases: Secondary | ICD-10-CM | POA: Diagnosis not present

## 2021-01-31 DIAGNOSIS — M6281 Muscle weakness (generalized): Secondary | ICD-10-CM | POA: Diagnosis not present

## 2021-01-31 DIAGNOSIS — R2689 Other abnormalities of gait and mobility: Secondary | ICD-10-CM | POA: Diagnosis not present

## 2021-01-31 DIAGNOSIS — R296 Repeated falls: Secondary | ICD-10-CM | POA: Diagnosis not present

## 2021-01-31 DIAGNOSIS — R2681 Unsteadiness on feet: Secondary | ICD-10-CM | POA: Diagnosis not present

## 2021-02-05 DIAGNOSIS — Z1159 Encounter for screening for other viral diseases: Secondary | ICD-10-CM | POA: Diagnosis not present

## 2021-02-05 DIAGNOSIS — Z20828 Contact with and (suspected) exposure to other viral communicable diseases: Secondary | ICD-10-CM | POA: Diagnosis not present

## 2021-02-07 DIAGNOSIS — R2689 Other abnormalities of gait and mobility: Secondary | ICD-10-CM | POA: Diagnosis not present

## 2021-02-07 DIAGNOSIS — M6281 Muscle weakness (generalized): Secondary | ICD-10-CM | POA: Diagnosis not present

## 2021-02-07 DIAGNOSIS — R2681 Unsteadiness on feet: Secondary | ICD-10-CM | POA: Diagnosis not present

## 2021-02-07 DIAGNOSIS — R296 Repeated falls: Secondary | ICD-10-CM | POA: Diagnosis not present

## 2021-02-09 DIAGNOSIS — R2681 Unsteadiness on feet: Secondary | ICD-10-CM | POA: Diagnosis not present

## 2021-02-09 DIAGNOSIS — M6281 Muscle weakness (generalized): Secondary | ICD-10-CM | POA: Diagnosis not present

## 2021-02-09 DIAGNOSIS — R2689 Other abnormalities of gait and mobility: Secondary | ICD-10-CM | POA: Diagnosis not present

## 2021-02-09 DIAGNOSIS — R296 Repeated falls: Secondary | ICD-10-CM | POA: Diagnosis not present

## 2021-02-12 DIAGNOSIS — R296 Repeated falls: Secondary | ICD-10-CM | POA: Diagnosis not present

## 2021-02-12 DIAGNOSIS — Z20828 Contact with and (suspected) exposure to other viral communicable diseases: Secondary | ICD-10-CM | POA: Diagnosis not present

## 2021-02-12 DIAGNOSIS — R2689 Other abnormalities of gait and mobility: Secondary | ICD-10-CM | POA: Diagnosis not present

## 2021-02-12 DIAGNOSIS — Z1159 Encounter for screening for other viral diseases: Secondary | ICD-10-CM | POA: Diagnosis not present

## 2021-02-12 DIAGNOSIS — M6281 Muscle weakness (generalized): Secondary | ICD-10-CM | POA: Diagnosis not present

## 2021-02-12 DIAGNOSIS — R2681 Unsteadiness on feet: Secondary | ICD-10-CM | POA: Diagnosis not present

## 2021-02-13 DIAGNOSIS — M6281 Muscle weakness (generalized): Secondary | ICD-10-CM | POA: Diagnosis not present

## 2021-02-13 DIAGNOSIS — R2689 Other abnormalities of gait and mobility: Secondary | ICD-10-CM | POA: Diagnosis not present

## 2021-02-13 DIAGNOSIS — R296 Repeated falls: Secondary | ICD-10-CM | POA: Diagnosis not present

## 2021-02-13 DIAGNOSIS — R2681 Unsteadiness on feet: Secondary | ICD-10-CM | POA: Diagnosis not present

## 2021-02-16 DIAGNOSIS — R296 Repeated falls: Secondary | ICD-10-CM | POA: Diagnosis not present

## 2021-02-16 DIAGNOSIS — R2689 Other abnormalities of gait and mobility: Secondary | ICD-10-CM | POA: Diagnosis not present

## 2021-02-16 DIAGNOSIS — R2681 Unsteadiness on feet: Secondary | ICD-10-CM | POA: Diagnosis not present

## 2021-02-16 DIAGNOSIS — M6281 Muscle weakness (generalized): Secondary | ICD-10-CM | POA: Diagnosis not present

## 2021-02-19 DIAGNOSIS — Z1159 Encounter for screening for other viral diseases: Secondary | ICD-10-CM | POA: Diagnosis not present

## 2021-02-19 DIAGNOSIS — Z20828 Contact with and (suspected) exposure to other viral communicable diseases: Secondary | ICD-10-CM | POA: Diagnosis not present

## 2021-02-20 DIAGNOSIS — R2689 Other abnormalities of gait and mobility: Secondary | ICD-10-CM | POA: Diagnosis not present

## 2021-02-20 DIAGNOSIS — R2681 Unsteadiness on feet: Secondary | ICD-10-CM | POA: Diagnosis not present

## 2021-02-20 DIAGNOSIS — R296 Repeated falls: Secondary | ICD-10-CM | POA: Diagnosis not present

## 2021-02-20 DIAGNOSIS — M6281 Muscle weakness (generalized): Secondary | ICD-10-CM | POA: Diagnosis not present

## 2021-02-22 DIAGNOSIS — R2689 Other abnormalities of gait and mobility: Secondary | ICD-10-CM | POA: Diagnosis not present

## 2021-02-22 DIAGNOSIS — R296 Repeated falls: Secondary | ICD-10-CM | POA: Diagnosis not present

## 2021-02-22 DIAGNOSIS — R2681 Unsteadiness on feet: Secondary | ICD-10-CM | POA: Diagnosis not present

## 2021-02-22 DIAGNOSIS — M6281 Muscle weakness (generalized): Secondary | ICD-10-CM | POA: Diagnosis not present

## 2021-02-26 DIAGNOSIS — R296 Repeated falls: Secondary | ICD-10-CM | POA: Diagnosis not present

## 2021-02-26 DIAGNOSIS — R2689 Other abnormalities of gait and mobility: Secondary | ICD-10-CM | POA: Diagnosis not present

## 2021-02-26 DIAGNOSIS — Z20828 Contact with and (suspected) exposure to other viral communicable diseases: Secondary | ICD-10-CM | POA: Diagnosis not present

## 2021-02-26 DIAGNOSIS — Z1159 Encounter for screening for other viral diseases: Secondary | ICD-10-CM | POA: Diagnosis not present

## 2021-02-26 DIAGNOSIS — M6281 Muscle weakness (generalized): Secondary | ICD-10-CM | POA: Diagnosis not present

## 2021-02-26 DIAGNOSIS — R2681 Unsteadiness on feet: Secondary | ICD-10-CM | POA: Diagnosis not present

## 2021-03-01 DIAGNOSIS — R2681 Unsteadiness on feet: Secondary | ICD-10-CM | POA: Diagnosis not present

## 2021-03-01 DIAGNOSIS — M6281 Muscle weakness (generalized): Secondary | ICD-10-CM | POA: Diagnosis not present

## 2021-03-01 DIAGNOSIS — R296 Repeated falls: Secondary | ICD-10-CM | POA: Diagnosis not present

## 2021-03-01 DIAGNOSIS — R2689 Other abnormalities of gait and mobility: Secondary | ICD-10-CM | POA: Diagnosis not present

## 2021-03-05 DIAGNOSIS — Z20828 Contact with and (suspected) exposure to other viral communicable diseases: Secondary | ICD-10-CM | POA: Diagnosis not present

## 2021-03-05 DIAGNOSIS — Z1159 Encounter for screening for other viral diseases: Secondary | ICD-10-CM | POA: Diagnosis not present

## 2021-03-06 DIAGNOSIS — H26493 Other secondary cataract, bilateral: Secondary | ICD-10-CM | POA: Diagnosis not present

## 2021-03-06 DIAGNOSIS — Z961 Presence of intraocular lens: Secondary | ICD-10-CM | POA: Diagnosis not present

## 2021-03-12 DIAGNOSIS — Z1159 Encounter for screening for other viral diseases: Secondary | ICD-10-CM | POA: Diagnosis not present

## 2021-03-12 DIAGNOSIS — Z20828 Contact with and (suspected) exposure to other viral communicable diseases: Secondary | ICD-10-CM | POA: Diagnosis not present

## 2021-03-19 DIAGNOSIS — Z1159 Encounter for screening for other viral diseases: Secondary | ICD-10-CM | POA: Diagnosis not present

## 2021-03-19 DIAGNOSIS — Z20828 Contact with and (suspected) exposure to other viral communicable diseases: Secondary | ICD-10-CM | POA: Diagnosis not present

## 2021-03-26 DIAGNOSIS — Z1159 Encounter for screening for other viral diseases: Secondary | ICD-10-CM | POA: Diagnosis not present

## 2021-03-26 DIAGNOSIS — Z20828 Contact with and (suspected) exposure to other viral communicable diseases: Secondary | ICD-10-CM | POA: Diagnosis not present

## 2021-04-02 DIAGNOSIS — Z20828 Contact with and (suspected) exposure to other viral communicable diseases: Secondary | ICD-10-CM | POA: Diagnosis not present

## 2021-04-02 DIAGNOSIS — Z1159 Encounter for screening for other viral diseases: Secondary | ICD-10-CM | POA: Diagnosis not present

## 2021-04-05 DIAGNOSIS — H6123 Impacted cerumen, bilateral: Secondary | ICD-10-CM | POA: Diagnosis not present

## 2021-04-05 DIAGNOSIS — Z1159 Encounter for screening for other viral diseases: Secondary | ICD-10-CM | POA: Diagnosis not present

## 2021-04-05 DIAGNOSIS — Z20828 Contact with and (suspected) exposure to other viral communicable diseases: Secondary | ICD-10-CM | POA: Diagnosis not present

## 2021-04-09 DIAGNOSIS — Z1159 Encounter for screening for other viral diseases: Secondary | ICD-10-CM | POA: Diagnosis not present

## 2021-04-09 DIAGNOSIS — Z20828 Contact with and (suspected) exposure to other viral communicable diseases: Secondary | ICD-10-CM | POA: Diagnosis not present

## 2021-04-16 DIAGNOSIS — Z1159 Encounter for screening for other viral diseases: Secondary | ICD-10-CM | POA: Diagnosis not present

## 2021-04-16 DIAGNOSIS — Z20828 Contact with and (suspected) exposure to other viral communicable diseases: Secondary | ICD-10-CM | POA: Diagnosis not present

## 2021-04-23 DIAGNOSIS — Z1159 Encounter for screening for other viral diseases: Secondary | ICD-10-CM | POA: Diagnosis not present

## 2021-04-23 DIAGNOSIS — Z20828 Contact with and (suspected) exposure to other viral communicable diseases: Secondary | ICD-10-CM | POA: Diagnosis not present

## 2021-04-30 DIAGNOSIS — Z20828 Contact with and (suspected) exposure to other viral communicable diseases: Secondary | ICD-10-CM | POA: Diagnosis not present

## 2021-04-30 DIAGNOSIS — Z1159 Encounter for screening for other viral diseases: Secondary | ICD-10-CM | POA: Diagnosis not present

## 2021-05-07 DIAGNOSIS — Z1159 Encounter for screening for other viral diseases: Secondary | ICD-10-CM | POA: Diagnosis not present

## 2021-05-07 DIAGNOSIS — Z20828 Contact with and (suspected) exposure to other viral communicable diseases: Secondary | ICD-10-CM | POA: Diagnosis not present

## 2021-05-14 DIAGNOSIS — Z20828 Contact with and (suspected) exposure to other viral communicable diseases: Secondary | ICD-10-CM | POA: Diagnosis not present

## 2021-05-21 DIAGNOSIS — Z8616 Personal history of COVID-19: Secondary | ICD-10-CM | POA: Diagnosis not present

## 2021-05-28 DIAGNOSIS — Z8616 Personal history of COVID-19: Secondary | ICD-10-CM | POA: Diagnosis not present

## 2021-06-04 DIAGNOSIS — Z20828 Contact with and (suspected) exposure to other viral communicable diseases: Secondary | ICD-10-CM | POA: Diagnosis not present

## 2021-06-18 DIAGNOSIS — Z20828 Contact with and (suspected) exposure to other viral communicable diseases: Secondary | ICD-10-CM | POA: Diagnosis not present

## 2021-06-20 DIAGNOSIS — R262 Difficulty in walking, not elsewhere classified: Secondary | ICD-10-CM | POA: Diagnosis not present

## 2021-06-20 DIAGNOSIS — R296 Repeated falls: Secondary | ICD-10-CM | POA: Diagnosis not present

## 2021-06-20 DIAGNOSIS — R278 Other lack of coordination: Secondary | ICD-10-CM | POA: Diagnosis not present

## 2021-06-20 DIAGNOSIS — R2681 Unsteadiness on feet: Secondary | ICD-10-CM | POA: Diagnosis not present

## 2021-06-21 DIAGNOSIS — R278 Other lack of coordination: Secondary | ICD-10-CM | POA: Diagnosis not present

## 2021-06-21 DIAGNOSIS — R2681 Unsteadiness on feet: Secondary | ICD-10-CM | POA: Diagnosis not present

## 2021-06-21 DIAGNOSIS — R296 Repeated falls: Secondary | ICD-10-CM | POA: Diagnosis not present

## 2021-06-21 DIAGNOSIS — R262 Difficulty in walking, not elsewhere classified: Secondary | ICD-10-CM | POA: Diagnosis not present

## 2021-06-25 DIAGNOSIS — R278 Other lack of coordination: Secondary | ICD-10-CM | POA: Diagnosis not present

## 2021-06-25 DIAGNOSIS — R2681 Unsteadiness on feet: Secondary | ICD-10-CM | POA: Diagnosis not present

## 2021-06-25 DIAGNOSIS — R262 Difficulty in walking, not elsewhere classified: Secondary | ICD-10-CM | POA: Diagnosis not present

## 2021-06-25 DIAGNOSIS — R296 Repeated falls: Secondary | ICD-10-CM | POA: Diagnosis not present

## 2021-06-27 DIAGNOSIS — R262 Difficulty in walking, not elsewhere classified: Secondary | ICD-10-CM | POA: Diagnosis not present

## 2021-06-27 DIAGNOSIS — R278 Other lack of coordination: Secondary | ICD-10-CM | POA: Diagnosis not present

## 2021-06-27 DIAGNOSIS — R2681 Unsteadiness on feet: Secondary | ICD-10-CM | POA: Diagnosis not present

## 2021-06-27 DIAGNOSIS — R296 Repeated falls: Secondary | ICD-10-CM | POA: Diagnosis not present

## 2021-07-04 DIAGNOSIS — R296 Repeated falls: Secondary | ICD-10-CM | POA: Diagnosis not present

## 2021-07-04 DIAGNOSIS — R262 Difficulty in walking, not elsewhere classified: Secondary | ICD-10-CM | POA: Diagnosis not present

## 2021-07-04 DIAGNOSIS — R278 Other lack of coordination: Secondary | ICD-10-CM | POA: Diagnosis not present

## 2021-07-04 DIAGNOSIS — R2681 Unsteadiness on feet: Secondary | ICD-10-CM | POA: Diagnosis not present

## 2021-07-06 DIAGNOSIS — R2681 Unsteadiness on feet: Secondary | ICD-10-CM | POA: Diagnosis not present

## 2021-07-06 DIAGNOSIS — R262 Difficulty in walking, not elsewhere classified: Secondary | ICD-10-CM | POA: Diagnosis not present

## 2021-07-06 DIAGNOSIS — R296 Repeated falls: Secondary | ICD-10-CM | POA: Diagnosis not present

## 2021-07-06 DIAGNOSIS — R278 Other lack of coordination: Secondary | ICD-10-CM | POA: Diagnosis not present

## 2021-07-09 DIAGNOSIS — R278 Other lack of coordination: Secondary | ICD-10-CM | POA: Diagnosis not present

## 2021-07-09 DIAGNOSIS — R262 Difficulty in walking, not elsewhere classified: Secondary | ICD-10-CM | POA: Diagnosis not present

## 2021-07-09 DIAGNOSIS — R296 Repeated falls: Secondary | ICD-10-CM | POA: Diagnosis not present

## 2021-07-09 DIAGNOSIS — Z20828 Contact with and (suspected) exposure to other viral communicable diseases: Secondary | ICD-10-CM | POA: Diagnosis not present

## 2021-07-09 DIAGNOSIS — R2681 Unsteadiness on feet: Secondary | ICD-10-CM | POA: Diagnosis not present

## 2021-07-11 DIAGNOSIS — R296 Repeated falls: Secondary | ICD-10-CM | POA: Diagnosis not present

## 2021-07-11 DIAGNOSIS — R2681 Unsteadiness on feet: Secondary | ICD-10-CM | POA: Diagnosis not present

## 2021-07-11 DIAGNOSIS — R262 Difficulty in walking, not elsewhere classified: Secondary | ICD-10-CM | POA: Diagnosis not present

## 2021-07-11 DIAGNOSIS — R278 Other lack of coordination: Secondary | ICD-10-CM | POA: Diagnosis not present

## 2021-07-16 DIAGNOSIS — Z20828 Contact with and (suspected) exposure to other viral communicable diseases: Secondary | ICD-10-CM | POA: Diagnosis not present

## 2021-07-17 DIAGNOSIS — R262 Difficulty in walking, not elsewhere classified: Secondary | ICD-10-CM | POA: Diagnosis not present

## 2021-07-17 DIAGNOSIS — R278 Other lack of coordination: Secondary | ICD-10-CM | POA: Diagnosis not present

## 2021-07-17 DIAGNOSIS — R296 Repeated falls: Secondary | ICD-10-CM | POA: Diagnosis not present

## 2021-07-17 DIAGNOSIS — R2681 Unsteadiness on feet: Secondary | ICD-10-CM | POA: Diagnosis not present

## 2021-07-19 DIAGNOSIS — R296 Repeated falls: Secondary | ICD-10-CM | POA: Diagnosis not present

## 2021-07-19 DIAGNOSIS — R2681 Unsteadiness on feet: Secondary | ICD-10-CM | POA: Diagnosis not present

## 2021-07-19 DIAGNOSIS — R278 Other lack of coordination: Secondary | ICD-10-CM | POA: Diagnosis not present

## 2021-07-19 DIAGNOSIS — R262 Difficulty in walking, not elsewhere classified: Secondary | ICD-10-CM | POA: Diagnosis not present

## 2021-07-23 DIAGNOSIS — Z20828 Contact with and (suspected) exposure to other viral communicable diseases: Secondary | ICD-10-CM | POA: Diagnosis not present

## 2021-07-24 DIAGNOSIS — R296 Repeated falls: Secondary | ICD-10-CM | POA: Diagnosis not present

## 2021-07-24 DIAGNOSIS — R2681 Unsteadiness on feet: Secondary | ICD-10-CM | POA: Diagnosis not present

## 2021-07-24 DIAGNOSIS — R278 Other lack of coordination: Secondary | ICD-10-CM | POA: Diagnosis not present

## 2021-07-24 DIAGNOSIS — R262 Difficulty in walking, not elsewhere classified: Secondary | ICD-10-CM | POA: Diagnosis not present

## 2021-07-25 DIAGNOSIS — R262 Difficulty in walking, not elsewhere classified: Secondary | ICD-10-CM | POA: Diagnosis not present

## 2021-07-25 DIAGNOSIS — R296 Repeated falls: Secondary | ICD-10-CM | POA: Diagnosis not present

## 2021-07-25 DIAGNOSIS — R278 Other lack of coordination: Secondary | ICD-10-CM | POA: Diagnosis not present

## 2021-07-25 DIAGNOSIS — R2681 Unsteadiness on feet: Secondary | ICD-10-CM | POA: Diagnosis not present

## 2021-07-30 DIAGNOSIS — Z20828 Contact with and (suspected) exposure to other viral communicable diseases: Secondary | ICD-10-CM | POA: Diagnosis not present

## 2021-07-30 DIAGNOSIS — R262 Difficulty in walking, not elsewhere classified: Secondary | ICD-10-CM | POA: Diagnosis not present

## 2021-07-30 DIAGNOSIS — R296 Repeated falls: Secondary | ICD-10-CM | POA: Diagnosis not present

## 2021-07-30 DIAGNOSIS — R278 Other lack of coordination: Secondary | ICD-10-CM | POA: Diagnosis not present

## 2021-07-30 DIAGNOSIS — R2681 Unsteadiness on feet: Secondary | ICD-10-CM | POA: Diagnosis not present

## 2021-08-02 DIAGNOSIS — R296 Repeated falls: Secondary | ICD-10-CM | POA: Diagnosis not present

## 2021-08-02 DIAGNOSIS — R2681 Unsteadiness on feet: Secondary | ICD-10-CM | POA: Diagnosis not present

## 2021-08-02 DIAGNOSIS — R262 Difficulty in walking, not elsewhere classified: Secondary | ICD-10-CM | POA: Diagnosis not present

## 2021-08-02 DIAGNOSIS — R278 Other lack of coordination: Secondary | ICD-10-CM | POA: Diagnosis not present

## 2021-08-06 DIAGNOSIS — R296 Repeated falls: Secondary | ICD-10-CM | POA: Diagnosis not present

## 2021-08-06 DIAGNOSIS — R278 Other lack of coordination: Secondary | ICD-10-CM | POA: Diagnosis not present

## 2021-08-06 DIAGNOSIS — R262 Difficulty in walking, not elsewhere classified: Secondary | ICD-10-CM | POA: Diagnosis not present

## 2021-08-06 DIAGNOSIS — R2681 Unsteadiness on feet: Secondary | ICD-10-CM | POA: Diagnosis not present

## 2021-08-09 ENCOUNTER — Emergency Department (HOSPITAL_COMMUNITY): Payer: Medicare Other

## 2021-08-09 ENCOUNTER — Inpatient Hospital Stay (HOSPITAL_COMMUNITY)
Admission: EM | Admit: 2021-08-09 | Discharge: 2021-08-11 | DRG: 689 | Disposition: A | Discharge status: Skilled Nursing Facility | Payer: Medicare Other | Source: Skilled Nursing Facility | Attending: Internal Medicine | Admitting: Internal Medicine

## 2021-08-09 ENCOUNTER — Other Ambulatory Visit: Payer: Self-pay

## 2021-08-09 ENCOUNTER — Encounter (HOSPITAL_COMMUNITY): Payer: Self-pay

## 2021-08-09 DIAGNOSIS — Z7982 Long term (current) use of aspirin: Secondary | ICD-10-CM | POA: Diagnosis not present

## 2021-08-09 DIAGNOSIS — Z20822 Contact with and (suspected) exposure to covid-19: Secondary | ICD-10-CM | POA: Diagnosis present

## 2021-08-09 DIAGNOSIS — G9340 Encephalopathy, unspecified: Secondary | ICD-10-CM | POA: Diagnosis not present

## 2021-08-09 DIAGNOSIS — G9341 Metabolic encephalopathy: Secondary | ICD-10-CM | POA: Diagnosis present

## 2021-08-09 DIAGNOSIS — G319 Degenerative disease of nervous system, unspecified: Secondary | ICD-10-CM | POA: Diagnosis not present

## 2021-08-09 DIAGNOSIS — R262 Difficulty in walking, not elsewhere classified: Secondary | ICD-10-CM | POA: Diagnosis not present

## 2021-08-09 DIAGNOSIS — Z9842 Cataract extraction status, left eye: Secondary | ICD-10-CM | POA: Diagnosis not present

## 2021-08-09 DIAGNOSIS — Z85820 Personal history of malignant melanoma of skin: Secondary | ICD-10-CM

## 2021-08-09 DIAGNOSIS — Z79899 Other long term (current) drug therapy: Secondary | ICD-10-CM

## 2021-08-09 DIAGNOSIS — E876 Hypokalemia: Secondary | ICD-10-CM | POA: Diagnosis present

## 2021-08-09 DIAGNOSIS — N3000 Acute cystitis without hematuria: Secondary | ICD-10-CM | POA: Diagnosis not present

## 2021-08-09 DIAGNOSIS — D329 Benign neoplasm of meninges, unspecified: Secondary | ICD-10-CM | POA: Diagnosis not present

## 2021-08-09 DIAGNOSIS — W19XXXA Unspecified fall, initial encounter: Secondary | ICD-10-CM | POA: Diagnosis not present

## 2021-08-09 DIAGNOSIS — Z66 Do not resuscitate: Secondary | ICD-10-CM | POA: Diagnosis present

## 2021-08-09 DIAGNOSIS — S2231XA Fracture of one rib, right side, initial encounter for closed fracture: Secondary | ICD-10-CM | POA: Diagnosis not present

## 2021-08-09 DIAGNOSIS — R2681 Unsteadiness on feet: Secondary | ICD-10-CM | POA: Diagnosis not present

## 2021-08-09 DIAGNOSIS — Z9841 Cataract extraction status, right eye: Secondary | ICD-10-CM

## 2021-08-09 DIAGNOSIS — I10 Essential (primary) hypertension: Secondary | ICD-10-CM | POA: Diagnosis present

## 2021-08-09 DIAGNOSIS — Z833 Family history of diabetes mellitus: Secondary | ICD-10-CM

## 2021-08-09 DIAGNOSIS — M4319 Spondylolisthesis, multiple sites in spine: Secondary | ICD-10-CM | POA: Diagnosis not present

## 2021-08-09 DIAGNOSIS — B9620 Unspecified Escherichia coli [E. coli] as the cause of diseases classified elsewhere: Secondary | ICD-10-CM | POA: Diagnosis present

## 2021-08-09 DIAGNOSIS — R296 Repeated falls: Secondary | ICD-10-CM | POA: Diagnosis not present

## 2021-08-09 DIAGNOSIS — Z888 Allergy status to other drugs, medicaments and biological substances status: Secondary | ICD-10-CM

## 2021-08-09 DIAGNOSIS — R278 Other lack of coordination: Secondary | ICD-10-CM | POA: Diagnosis not present

## 2021-08-09 DIAGNOSIS — S199XXA Unspecified injury of neck, initial encounter: Secondary | ICD-10-CM | POA: Diagnosis not present

## 2021-08-09 DIAGNOSIS — J9811 Atelectasis: Secondary | ICD-10-CM | POA: Diagnosis not present

## 2021-08-09 DIAGNOSIS — R4182 Altered mental status, unspecified: Secondary | ICD-10-CM | POA: Diagnosis not present

## 2021-08-09 DIAGNOSIS — Z981 Arthrodesis status: Secondary | ICD-10-CM

## 2021-08-09 DIAGNOSIS — R456 Violent behavior: Secondary | ICD-10-CM | POA: Diagnosis not present

## 2021-08-09 DIAGNOSIS — J984 Other disorders of lung: Secondary | ICD-10-CM | POA: Diagnosis not present

## 2021-08-09 DIAGNOSIS — M542 Cervicalgia: Secondary | ICD-10-CM | POA: Diagnosis not present

## 2021-08-09 DIAGNOSIS — N390 Urinary tract infection, site not specified: Secondary | ICD-10-CM | POA: Diagnosis present

## 2021-08-09 DIAGNOSIS — Z8249 Family history of ischemic heart disease and other diseases of the circulatory system: Secondary | ICD-10-CM | POA: Diagnosis not present

## 2021-08-09 DIAGNOSIS — R404 Transient alteration of awareness: Secondary | ICD-10-CM | POA: Diagnosis not present

## 2021-08-09 DIAGNOSIS — S0990XA Unspecified injury of head, initial encounter: Secondary | ICD-10-CM | POA: Diagnosis not present

## 2021-08-09 DIAGNOSIS — I6529 Occlusion and stenosis of unspecified carotid artery: Secondary | ICD-10-CM | POA: Diagnosis not present

## 2021-08-09 LAB — URINALYSIS, ROUTINE W REFLEX MICROSCOPIC
Bilirubin Urine: NEGATIVE
Glucose, UA: NEGATIVE mg/dL
Ketones, ur: NEGATIVE mg/dL
Nitrite: NEGATIVE
Protein, ur: NEGATIVE mg/dL
Specific Gravity, Urine: 1.009 (ref 1.005–1.030)
pH: 8 (ref 5.0–8.0)

## 2021-08-09 LAB — COMPREHENSIVE METABOLIC PANEL
ALT: 14 U/L (ref 0–44)
AST: 23 U/L (ref 15–41)
Albumin: 3.9 g/dL (ref 3.5–5.0)
Alkaline Phosphatase: 72 U/L (ref 38–126)
Anion gap: 11 (ref 5–15)
BUN: 19 mg/dL (ref 8–23)
CO2: 25 mmol/L (ref 22–32)
Calcium: 9.2 mg/dL (ref 8.9–10.3)
Chloride: 103 mmol/L (ref 98–111)
Creatinine, Ser: 0.74 mg/dL (ref 0.44–1.00)
GFR, Estimated: >60 mL/min (ref >60)
Glucose, Bld: 101 mg/dL — ABNORMAL HIGH (ref 70–99)
Potassium: 3.4 mmol/L — ABNORMAL LOW (ref 3.5–5.1)
Sodium: 139 mmol/L (ref 135–145)
Total Bilirubin: 1.5 mg/dL — ABNORMAL HIGH (ref 0.3–1.2)
Total Protein: 7.9 g/dL (ref 6.5–8.1)

## 2021-08-09 LAB — CBC WITH DIFFERENTIAL/PLATELET
Abs Immature Granulocytes: 0.03 10*3/uL (ref 0.00–0.07)
Basophils Absolute: 0 10*3/uL (ref 0.0–0.1)
Basophils Relative: 0 %
Eosinophils Absolute: 0.1 10*3/uL (ref 0.0–0.5)
Eosinophils Relative: 2 %
HCT: 41.9 % (ref 36.0–46.0)
Hemoglobin: 13.9 g/dL (ref 12.0–15.0)
Immature Granulocytes: 0 %
Lymphocytes Relative: 28 %
Lymphs Abs: 2.3 10*3/uL (ref 0.7–4.0)
MCH: 32.1 pg (ref 26.0–34.0)
MCHC: 33.2 g/dL (ref 30.0–36.0)
MCV: 96.8 fL (ref 80.0–100.0)
Monocytes Absolute: 0.8 10*3/uL (ref 0.1–1.0)
Monocytes Relative: 10 %
Neutro Abs: 4.9 10*3/uL (ref 1.7–7.7)
Neutrophils Relative %: 60 %
Platelets: 386 10*3/uL (ref 150–400)
RBC: 4.33 MIL/uL (ref 3.87–5.11)
RDW: 12.9 % (ref 11.5–15.5)
WBC: 8.1 10*3/uL (ref 4.0–10.5)
nRBC: 0 % (ref 0.0–0.2)

## 2021-08-09 LAB — AMMONIA: Ammonia: 18 umol/L (ref 9–35)

## 2021-08-09 LAB — RESP PANEL BY RT-PCR (FLU A&B, COVID) ARPGX2
Influenza A by PCR: NEGATIVE
Influenza B by PCR: NEGATIVE
SARS Coronavirus 2 by RT PCR: NEGATIVE

## 2021-08-09 LAB — FOLATE: Folate: 35.2 ng/mL (ref >5.9)

## 2021-08-09 LAB — MRSA NEXT GEN BY PCR, NASAL: MRSA by PCR Next Gen: NOT DETECTED

## 2021-08-09 LAB — C-REACTIVE PROTEIN: CRP: 2.9 mg/dL — ABNORMAL HIGH (ref <1.0)

## 2021-08-09 LAB — VITAMIN B12: Vitamin B-12: 807 pg/mL (ref 180–914)

## 2021-08-09 LAB — TSH: TSH: 3.646 u[IU]/mL (ref 0.350–4.500)

## 2021-08-09 MED ORDER — ONDANSETRON HCL 4 MG PO TABS: 4.0000 mg | ORAL_TABLET | Freq: Four times a day (QID) | ORAL | Status: DC | PRN

## 2021-08-09 MED ORDER — SODIUM CHLORIDE 0.9 % IV BOLUS
500.0000 mL | Freq: Once | INTRAVENOUS | Status: AC
  Administered 2021-08-09: 500 mL via INTRAVENOUS

## 2021-08-09 MED ORDER — ACETAMINOPHEN 325 MG PO TABS: 650.0000 mg | ORAL_TABLET | Freq: Four times a day (QID) | ORAL | Status: DC | PRN

## 2021-08-09 MED ORDER — LORAZEPAM 2 MG/ML IJ SOLN
1.0000 mg | Freq: Once | INTRAMUSCULAR | Status: AC
  Administered 2021-08-09: 1 mg via INTRAVENOUS
  Filled 2021-08-09: qty 1

## 2021-08-09 MED ORDER — SODIUM CHLORIDE 0.9 % IV SOLN: INTRAVENOUS | Status: AC

## 2021-08-09 MED ORDER — HYDRALAZINE HCL 20 MG/ML IJ SOLN: 10.0000 mg | Freq: Four times a day (QID) | INTRAMUSCULAR | Status: DC | PRN

## 2021-08-09 MED ORDER — ONDANSETRON HCL 4 MG/2ML IJ SOLN: 4.0000 mg | Freq: Four times a day (QID) | INTRAMUSCULAR | Status: DC | PRN

## 2021-08-09 MED ORDER — SODIUM CHLORIDE 0.9 % IV SOLN
1.0000 g | Freq: Once | INTRAVENOUS | Status: AC
  Administered 2021-08-09: 1 g via INTRAVENOUS
  Filled 2021-08-09: qty 10

## 2021-08-09 MED ORDER — SODIUM CHLORIDE 0.9 % IV SOLN
1.0000 g | INTRAVENOUS | Status: DC
  Administered 2021-08-10: 1 g via INTRAVENOUS
  Filled 2021-08-09: qty 10

## 2021-08-09 MED ORDER — AMLODIPINE BESYLATE 5 MG PO TABS
2.5000 mg | ORAL_TABLET | Freq: Every day | ORAL | Status: DC
  Administered 2021-08-10 – 2021-08-11 (×2): 2.5 mg via ORAL
  Filled 2021-08-09 (×2): qty 1

## 2021-08-09 MED ORDER — POTASSIUM CHLORIDE CRYS ER 20 MEQ PO TBCR
40.0000 meq | EXTENDED_RELEASE_TABLET | Freq: Once | ORAL | Status: AC
  Administered 2021-08-09: 40 meq via ORAL
  Filled 2021-08-09: qty 2

## 2021-08-09 MED ORDER — ENSURE ENLIVE PO LIQD
237.0000 mL | Freq: Two times a day (BID) | ORAL | Status: DC
  Administered 2021-08-10 – 2021-08-11 (×3): 237 mL via ORAL

## 2021-08-09 MED ORDER — ACETAMINOPHEN 650 MG RE SUPP: 650.0000 mg | Freq: Four times a day (QID) | RECTAL | Status: DC | PRN

## 2021-08-09 MED ORDER — ASPIRIN EC 81 MG PO TBEC
81.0000 mg | DELAYED_RELEASE_TABLET | Freq: Every day | ORAL | Status: DC
  Administered 2021-08-10 – 2021-08-11 (×2): 81 mg via ORAL
  Filled 2021-08-09 (×2): qty 1

## 2021-08-09 MED ORDER — POLYETHYLENE GLYCOL 3350 17 G PO PACK: 17.0000 g | PACK | Freq: Every day | ORAL | Status: DC | PRN

## 2021-08-09 NOTE — Assessment & Plan Note (Addendum)
Hypokalemia secondary to poor oral intake REPLACED

## 2021-08-09 NOTE — Assessment & Plan Note (Addendum)
Incidental finding on CT imaging, unchanged from CT head done in 2021.  Not clinically relevant to current condition Supportive care, outpatient follow-up

## 2021-08-09 NOTE — Assessment & Plan Note (Addendum)
Blood pressure stable. Resume home meds.

## 2021-08-09 NOTE — H&P (Addendum)
History and Physical    [name] FXT:[id number] DOB: [birth year] DOA: 08/09/2021  PCP: Gaynelle Arabian, MD  Patient coming from: [place] via EMS   Chief Complaint:  Chief Complaint  Patient presents with   Urinary Tract Infection     HPI:    87 year old female with past medical history of hypertension, iron deficiency anemia who presents to [facility] emergency department from [facility] assisted living facility via EMS due to progressively worsening confusion.  Patient is an extremely poor historian and therefore majority the history has been obtained from the daughter who is the decision-maker for the patient in addition to discussions with emergency department staff.  Recently was diagnosed with a urinary tract infection as an outpatient approximately 2 weeks ago.  Patient was treated with a course of Keflex and was felt to improve after therapy was complete.  Now, in the past several days patient has been exhibiting bouts of agitation, combativeness and overall confusion.  Patient also suffered a recent witnessed fall as a result.  According to the daughter this is not her baseline.  Patient's appetite has been poor as a result.  Patient has not exhibited any specific symptoms such as fevers, shortness of breath, cough, abdominal pain or diarrhea.  According to EMS discussions with the nursing facility staff patient tends to activism she has a urinary tract infection.  Due to patient's progressively worsening symptoms EMS was contacted who promptly came to evaluate the patient and brought her to [facility] emergency department for evaluation.  Upon evaluation in the emergency department COVID-19 PCR testing was found to be negative.  Chest x-ray revealed no evidence of pneumonia.  CT imaging the head revealed no acute finding except for an incidental meningioma.  Urinalysis was positive for leukocyte Estrace with 11-20 white blood cells  per high-powered field and many bacteria.  ER provider then proceeded to administer a dose of intravenous ceftriaxone for possible urinary tract infection.  Due to patient's continued confusion and agitation hospitalist group was then called to assess patient for admission to the hospital.  Review of Systems:   Review of Systems  Unable to perform ROS: Mental status change   Past Medical History:  Diagnosis Date   Cancer of the skin, basal cell 2008   FACE   Degenerative joint disease (DJD) of hip    Depression    Gait disorder    Hypertension    Hypokalemia    Iron deficiency anemia    Osteoporosis    Pelvic fracture [facility])     Past Surgical History:  Procedure Laterality Date   CATARACT EXTRACTION, BILATERAL     HIP FRACTURE SURGERY  09/2004   DR. WHITFIELD   NERVE ENTRAPMENT     LEFT THUMB   SPINAL FUSION  1996   L3-4 [facility]   WRIST FRACTURE SURGERY     DR. Daylene Katayama     reports that she has never smoked. She has never used smokeless tobacco. She reports that she does not drink alcohol and does not use drugs.  Allergies  Allergen Reactions   Dicloxacillin Other (See Comments)    Unknown severe allergic reaction   Other Other (See Comments)    Unknown reaction to hazel nuts   Pork-Derived Products Other (See Comments)    Pt does not eat pork   Septra [Sulfamethoxazole-Trimethoprim] Nausea And Vomiting    Family History  Problem Relation Age of Onset   Heart failure Mother  Hypertension Father    Diabetes Sister      Prior to Admission medications   Medication Sig Start Date End Date Taking? Authorizing Provider  amLODipine (NORVASC) 2.5 MG tablet Take 2.5 mg by mouth daily.   Yes [provider]  aspirin EC 81 MG tablet Take 81 mg by mouth daily. Swallow whole.   Yes [provider]  Calcium Carbonate-Vitamin D 600-400 MG-UNIT tablet Take 1 tablet by mouth every evening.    Yes [provider]  Multiple Vitamin  (MULTIVITAMIN WITH MINERALS) TABS tablet Take 1 tablet by mouth daily.   Yes [provider]  acetaminophen (TYLENOL) 650 MG CR tablet Take 650 mg by mouth every 8 (eight) hours as needed for pain.    [provider]  cephALEXin (KEFLEX) 250 MG capsule Take 1 capsule (250 mg total) by mouth 3 (three) times daily. Patient not taking: No sig reported 01/04/19   Norval Morton, MD  HYDROcodone-acetaminophen (NORCO/VICODIN) 5-325 MG tablet Take 1-2 tablets by mouth every 6 (six) hours as needed for moderate pain. Patient not taking: No sig reported 01/04/19   Fuller Plan A, MD  ondansetron (ZOFRAN) 4 MG tablet Take 1 tablet (4 mg total) by mouth every 8 (eight) hours as needed for nausea or vomiting. Patient not taking: No sig reported 03/11/20   Hayden Rasmussen, MD    Physical Exam: Vitals:   08/09/21 1630 08/09/21 1653 08/09/21 1852 08/09/21 1853  BP: (!) 204/80     Pulse:   86   Resp: (!) 21     Temp:    (!) 97.5 F (36.4 C)  TempSrc:    Axillary  SpO2:   100%   Weight:  40 kg    Height:  5' (1.524 m)      Constitutional: Arousable but confused and disoriented.  Patient is currently not in any acute distress. Skin: no rashes, no lesions, poor skin turgor noted. Eyes: Pupils are equally reactive to light.  No evidence of scleral icterus or conjunctival pallor.  ENMT: Dry mucous membranes noted.  Posterior pharynx clear of any exudate or lesions.   Neck: normal, supple, no masses, no thyromegaly.  No evidence of jugular venous distension.   Respiratory: clear to auscultation bilaterally, no wheezing, no crackles. Normal respiratory effort. No accessory muscle use.  Cardiovascular: Regular rate and rhythm, no murmurs / rubs / gallops. No extremity edema. 2+ pedal pulses. No carotid bruits.  Chest:   Nontender without crepitus or deformity.   Back:   Nontender without crepitus or deformity. Abdomen: Abdomen is soft and nontender.  No evidence of intra-abdominal  masses.  Positive bowel sounds noted in all quadrants.   Musculoskeletal: No joint deformity upper and lower extremities. Good ROM, no contractures.  Somewhat poor muscle tone. Neurologic: Patient is disoriented and confused with bouts of agitation.  Patient is only intermittently following commands.  Patient responds to verbal and painful stimuli.  Patient does localize to pain.  Patient is moving all 4 extremity spontaneously. Psychiatric: Unable to fully assess due to degree of confusion.  Patient currently does not seem to possess insight as to her current situation.  Labs on Admission: I have personally reviewed following labs and imaging studies -   CBC: Recent Labs  Lab 08/09/21 1623  WBC 8.1  NEUTROABS 4.9  HGB 13.9  HCT 41.9  MCV 96.8  PLT 109   Basic Metabolic Panel: Recent Labs  Lab 08/09/21 1623  NA 139  K 3.4*  CL 103  CO2 25  GLUCOSE 101*  BUN 19  CREATININE 0.74  CALCIUM 9.2   GFR: Estimated Creatinine Clearance: 24.2 mL/min (by C-G formula based on SCr of 0.74 mg/dL). Liver Function Tests: Recent Labs  Lab 08/09/21 1623  AST 23  ALT 14  ALKPHOS 72  BILITOT 1.5*  PROT 7.9  ALBUMIN 3.9   No results for input(s): LIPASE, AMYLASE in the last 168 hours. No results for input(s): AMMONIA in the last 168 hours. Coagulation Profile: No results for input(s): INR, PROTIME in the last 168 hours. Cardiac Enzymes: No results for input(s): CKTOTAL, CKMB, CKMBINDEX, TROPONINI in the last 168 hours. BNP (last 3 results) No results for input(s): PROBNP in the last 8760 hours. HbA1C: No results for input(s): HGBA1C in the last 72 hours. CBG: No results for input(s): GLUCAP in the last 168 hours. Lipid Profile: No results for input(s): CHOL, HDL, LDLCALC, TRIG, CHOLHDL, LDLDIRECT in the last 72 hours. Thyroid Function Tests: No results for input(s): TSH, T4TOTAL, FREET4, T3FREE, THYROIDAB in the last 72 hours. Anemia Panel: No results for input(s): VITAMINB12,  FOLATE, FERRITIN, TIBC, IRON, RETICCTPCT in the last 72 hours. Urine analysis:    Component Value Date/Time   COLORURINE YELLOW 08/09/2021 1623   APPEARANCEUR HAZY (A) 08/09/2021 1623   LABSPEC 1.009 08/09/2021 1623   PHURINE 8.0 08/09/2021 1623   GLUCOSEU NEGATIVE 08/09/2021 1623   HGBUR MODERATE (A) 08/09/2021 1623   BILIRUBINUR NEGATIVE 08/09/2021 1623   KETONESUR NEGATIVE 08/09/2021 1623   PROTEINUR NEGATIVE 08/09/2021 1623   UROBILINOGEN 1.0 09/16/2010 1226   NITRITE NEGATIVE 08/09/2021 1623   LEUKOCYTESUR LARGE (A) 08/09/2021 1623    Radiological Exams on Admission - Personally Reviewed: CT HEAD WO CONTRAST (5MM)  Result Date: 08/09/2021 CLINICAL DATA:  Head trauma, minor (Age >= 65y); Neck trauma (Age >= 65y) EXAM: CT HEAD WITHOUT CONTRAST CT CERVICAL SPINE WITHOUT CONTRAST TECHNIQUE: Multidetector CT imaging of the head and cervical spine was performed following the standard protocol without intravenous contrast. Multiplanar CT image reconstructions of the cervical spine were also generated. COMPARISON:  CT cervical spine July 08, 2017. CT head March 07, 2020. FINDINGS: CT HEAD FINDINGS Brain: Similar 15 mm extra-axial mass, just posterior to the porous acoustic is of the left internal auditory canal along the petrous ridge. No significant mass effect. No evidence of acute large vascular territory infarct, acute hemorrhage, midline shift. Similar age related atrophy and chronic microvascular ischemic disease. Vascular: No hyperdense vessel identified. Calcific intracranial atherosclerosis. Skull: No acute fracture. Chronic severe right TMJ degenerative change. Sinuses/Orbits: Clear visualized sinuses. Other: No mastoid effusions. CT CERVICAL SPINE FINDINGS Alignment: Similar alignment. Similar 5 mm of anterolisthesis of C7 on T1 and trace anterolisthesis of C3 on C4. Skull base and vertebrae: No evidence of acute fracture. Soft tissues and spinal canal: No prevertebral fluid or swelling.  No visible canal hematoma. Disc levels: Similar severe craniocervical degenerative change. Additionally, severe multilevel degenerative disease and facet arthropathy Upper chest: Biapical pleuroparenchymal scarring. Other: Carotid bifurcation atherosclerosis. IMPRESSION: CT head: 1. No evidence of acute intracranial abnormality. 2. Similar putative 15 mm meningioma just posterior to the left internal auditory canal. 3. Similar age related atrophy and chronic microvascular ischemic disease. CT cervical spine: 1. No evidence of acute fracture or traumatic malalignment. 2. Similar anterior C1 defect, discussed on 2018 prior. 3. Severe multilevel degenerative change. Electronically Signed   By: Margaretha Sheffield M.D.   On: 08/09/2021 17:47   CT Cervical Spine Wo Contrast  Result Date: 08/09/2021 CLINICAL DATA:  Head trauma, minor (Age >= 65y); Neck trauma (Age >= 65y) EXAM: CT HEAD WITHOUT CONTRAST CT CERVICAL SPINE WITHOUT CONTRAST TECHNIQUE: Multidetector CT imaging of the head and cervical spine was performed following the standard protocol without intravenous contrast. Multiplanar CT image reconstructions of the cervical spine were also generated. COMPARISON:  CT cervical spine July 08, 2017. CT head March 07, 2020. FINDINGS: CT HEAD FINDINGS Brain: Similar 15 mm extra-axial mass, just posterior to the porous acoustic is of the left internal auditory canal along the petrous ridge. No significant mass effect. No evidence of acute large vascular territory infarct, acute hemorrhage, midline shift. Similar age related atrophy and chronic microvascular ischemic disease. Vascular: No hyperdense vessel identified. Calcific intracranial atherosclerosis. Skull: No acute fracture. Chronic severe right TMJ degenerative change. Sinuses/Orbits: Clear visualized sinuses. Other: No mastoid effusions. CT CERVICAL SPINE FINDINGS Alignment: Similar alignment. Similar 5 mm of anterolisthesis of C7 on T1 and trace anterolisthesis  of C3 on C4. Skull base and vertebrae: No evidence of acute fracture. Soft tissues and spinal canal: No prevertebral fluid or swelling. No visible canal hematoma. Disc levels: Similar severe craniocervical degenerative change. Additionally, severe multilevel degenerative disease and facet arthropathy Upper chest: Biapical pleuroparenchymal scarring. Other: Carotid bifurcation atherosclerosis. IMPRESSION: CT head: 1. No evidence of acute intracranial abnormality. 2. Similar putative 15 mm meningioma just posterior to the left internal auditory canal. 3. Similar age related atrophy and chronic microvascular ischemic disease. CT cervical spine: 1. No evidence of acute fracture or traumatic malalignment. 2. Similar anterior C1 defect, discussed on 2018 prior. 3. Severe multilevel degenerative change. Electronically Signed   By: Margaretha Sheffield M.D.   On: 08/09/2021 17:47   DG Pelvis Portable  Result Date: 08/09/2021 CLINICAL DATA:  Altered mental status, status post fall. EXAM: PORTABLE PELVIS 1-2 VIEWS COMPARISON:  January 24, 2014 FINDINGS: There is no evidence of an acute pelvic fracture or diastasis. No pelvic bone lesions are seen. A total right hip replacement is seen without evidence of surrounding lucency to suggest the presence of hardware loosening or infection. A radiopaque fixation plate and compression screw device are seen within the proximal left femur. IMPRESSION: 1. No acute findings. 2. Right hip replacement and proximal left femoral fixation hardware without evidence of hardware complication. Electronically Signed   By: Virgina Norfolk M.D.   On: 08/09/2021 17:53   DG Chest Port 1 View  Result Date: 08/09/2021 CLINICAL DATA:  Altered mental status. EXAM: PORTABLE CHEST 1 VIEW COMPARISON:  January 02, 2019 FINDINGS: There is no evidence of acute infiltrate, pleural effusion or pneumothorax. Mild, stable left basilar atelectasis is seen the heart size and mediastinal contours are within normal  limits. A chronic seventh right rib fracture is noted. Prior vertebroplasty is seen at the level of L1 vertebral body. IMPRESSION: Predominantly stable exam without acute or active cardiopulmonary disease. Electronically Signed   By: Virgina Norfolk M.D.   On: 08/09/2021 17:47    EKG: Personally reviewed.  Rhythm is normal sinus rhythm with heart rate of 79 bpm.  Left anterior fascicular block.  No dynamic ST segment changes appreciated.  Assessment/Plan  * Acute metabolic encephalopathy Patient is exhibiting progressive confusion with bouts of agitation, a marked change from baseline mentation and activity This state has been developing over the past several days This is a deviation from patient's baseline according to the daughter and staff at the patient's nursing facility Patient is likely suffering from acute metabolic encephalopathy although  etiology is currently unclear Urinalysis is equivocal without symptoms consistent with urinary tract infection.  Patient is already been initiated on intravenous ceftriaxone by the emergency department staff and in the absence of an alternative explanation for the patient's confusion we will therefore continue this antibiotic for now until cultures are negative. Additionally provided patient with gentle intravenous hydration. CT head unremarkable. Obtaining vitamin B12, folate, urine toxicology screen, ammonia, inflammatory markers, TSH  Essential hypertension We will resume patient's home regimen of antihypertensives once regimen is confirmed by pharmacy. PRN intravenous antihypertensives for excessively elevated blood pressure    Hypokalemia, inadequate intake Hypokalemia secondary to poor oral intake Replacing with potassium chloride Evaluating for concurrent hypomagnesemia  Monitoring potassium levels with serial chemistries.   Meningioma [facility]) Incidental finding on CT imaging Not clinically relevant to current condition Supportive  care, outpatient follow-up     Code Status:  DNR  code status decision has been confirmed with: daughter Family Communication: Daughter has been updated on plan of care  Status is: Inpatient  Remains inpatient appropriate because: Acute metabolic encephalopathy of unclear etiology with suspected concurrent urinary tract infection requiring intravenous volume resuscitation, intravenous antibiotic therapy, extensive work-up to evaluate etiology of encephalopathy.        Vernelle Emerald MD [facility] Pager [phone]  If 7PM-7AM, please contact night-coverage [email or web address] Use universal [place] password for that web site. If you do not have the password, please call the hospital operator.  08/09/2021, 8:51 PM

## 2021-08-09 NOTE — ED Triage Notes (Signed)
Pt BIB EMS. Pt coming from [place] home. Per staff pt had a UTI and was treated but staff feels pt has another UTI. Per staff pt is not like her usual self. Pt fell Sunday and has possible neck and back pain. Pt is very combative.

## 2021-08-09 NOTE — Assessment & Plan Note (Addendum)
Patient is exhibiting progressive confusion with bouts of agitation, a marked change from baseline mentation and activity This is a deviation from patient's baseline according to the daughter and staff at the patient's nursing facility Patient is likely suffering from acute metabolic encephalopathy due to UTI CT head unremarkable. Normal vitamin B12, folate, ammonia, TSH.   Back to baseline after getting treatment for UTI

## 2021-08-09 NOTE — ED Notes (Signed)
ED TO INPATIENT HANDOFF REPORT  Name/Age/Gender [name] 87 y.o. female  Code Status    Code Status Orders  (From admission, onward)           Start     Ordered   08/09/21 1823  Do not attempt resuscitation/DNR  Continuous       Question Answer Comment  In the event of cardiac or respiratory ARREST Do not call a "code blue"   In the event of cardiac or respiratory ARREST Do not perform Intubation, CPR, defibrillation or ACLS   In the event of cardiac or respiratory ARREST Use medication by any route, position, wound care, and other measures to relive pain and suffering. May use oxygen, suction and manual treatment of airway obstruction as needed for comfort.      08/09/21 1822           Code Status History     Date Active Date Inactive Code Status Order ID Comments User Context   01/02/2019 2226 01/04/2019 1812 DNR [id number]  Toy Baker, MD Inpatient   07/08/2017 2219 07/09/2017 1549 DNR [id number]  Phillips Grout, MD Inpatient      Advance Directive Documentation    Flowsheet Row Most Recent Value  Type of Advance Directive Healthcare Power of [place], Living will, Out of facility DNR (pink MOST or yellow form)  Pre-existing out of facility DNR order (yellow form or pink MOST form) Pink MOST/Yellow Form most recent copy in chart - Physician notified to receive inpatient order  "MOST" Form in Place? --       Home/SNF/Other Nursing Home  Chief Complaint Acute metabolic encephalopathy [B28.41]  Level of Care/Admitting Diagnosis ED Disposition     ED Disposition  Admit   Condition  --   [place] [100102]  Level of Care: Telemetry [5]  Admit to tele based on following criteria: Monitor for Ischemic changes  May admit patient to [facility] or [facility] if equivalent level of care is available:: No  Covid Evaluation: Asymptomatic Screening Protocol (No Symptoms)  Diagnosis: Acute metabolic  encephalopathy [3244010]  Admitting Physician: Vernelle Emerald [[id number]]  Attending Physician: Vernelle Emerald [[id number]]  Estimated length of stay: 3 - 4 days  Certification:: I certify this patient will need inpatient services for at least 2 midnights          Medical History Past Medical History:  Diagnosis Date   Cancer of the skin, basal cell 2008   FACE   Degenerative joint disease (DJD) of hip    Depression    Gait disorder    Hypertension    Hypokalemia    Iron deficiency anemia    Osteoporosis    Pelvic fracture (HCC)     Allergies Allergies  Allergen Reactions   Dicloxacillin Other (See Comments)    Unknown severe allergic reaction   Other Other (See Comments)    Unknown reaction to hazel nuts   Pork-Derived Products Other (See Comments)    Pt does not eat pork   Septra [Sulfamethoxazole-Trimethoprim] Nausea And Vomiting    IV Location/Drains/Wounds Patient Lines/Drains/Airways Status     Active Line/Drains/Airways     Name Placement date Placement time Site Days   Peripheral IV 08/09/21 20 G Right Antecubital 08/09/21  1923  Antecubital  less than 1            Labs/Imaging Results for orders placed or performed during the hospital encounter of 08/09/21 (from the  past 48 hour(s))  CBC with Differential/Platelet     Status: None   Collection Time: 08/09/21  4:23 PM  Result Value Ref Range   WBC 8.1 4.0 - 10.5 K/uL   RBC 4.33 3.87 - 5.11 MIL/uL   Hemoglobin 13.9 12.0 - 15.0 g/dL   HCT 41.9 36.0 - 46.0 %   MCV 96.8 80.0 - 100.0 fL   MCH 32.1 26.0 - 34.0 pg   MCHC 33.2 30.0 - 36.0 g/dL   RDW 12.9 11.5 - 15.5 %   Platelets 386 150 - 400 K/uL   nRBC 0.0 0.0 - 0.2 %   Neutrophils Relative % 60 %   Neutro Abs 4.9 1.7 - 7.7 K/uL   Lymphocytes Relative 28 %   Lymphs Abs 2.3 0.7 - 4.0 K/uL   Monocytes Relative 10 %   Monocytes Absolute 0.8 0.1 - 1.0 K/uL   Eosinophils Relative 2 %   Eosinophils Absolute 0.1 0.0 - 0.5 K/uL   Basophils  Relative 0 %   Basophils Absolute 0.0 0.0 - 0.1 K/uL   Immature Granulocytes 0 %   Abs Immature Granulocytes 0.03 0.00 - 0.07 K/uL    Comment: Performed at [facility], [place] [address], [place], [place] [address]  Comprehensive metabolic panel     Status: Abnormal   Collection Time: 08/09/21  4:23 PM  Result Value Ref Range   Sodium 139 135 - 145 mmol/L   Potassium 3.4 (L) 3.5 - 5.1 mmol/L   Chloride 103 98 - 111 mmol/L   CO2 25 22 - 32 mmol/L   Glucose, Bld 101 (H) 70 - 99 mg/dL    Comment: Glucose reference range applies only to samples taken after fasting for at least 8 hours.   BUN 19 8 - 23 mg/dL   Creatinine, Ser 0.74 0.44 - 1.00 mg/dL   Calcium 9.2 8.9 - 10.3 mg/dL   Total Protein 7.9 6.5 - 8.1 g/dL   Albumin 3.9 3.5 - 5.0 g/dL   AST 23 15 - 41 U/L   ALT 14 0 - 44 U/L   Alkaline Phosphatase 72 38 - 126 U/L   Total Bilirubin 1.5 (H) 0.3 - 1.2 mg/dL   GFR, Estimated >60 >60 mL/min    Comment: (NOTE) Calculated using the CKD-EPI Creatinine Equation (2021)    Anion gap 11 5 - 15    Comment: Performed at [facility], [place] [address] [place], [place] [address]  Urinalysis, Routine w reflex microscopic Urine, Clean Catch     Status: Abnormal   Collection Time: 08/09/21  4:23 PM  Result Value Ref Range   Color, Urine YELLOW YELLOW   APPearance HAZY (A) CLEAR   Specific Gravity, Urine 1.009 1.005 - 1.030   pH 8.0 5.0 - 8.0   Glucose, UA NEGATIVE NEGATIVE mg/dL   Hgb urine dipstick MODERATE (A) NEGATIVE   Bilirubin Urine NEGATIVE NEGATIVE   Ketones, ur NEGATIVE NEGATIVE mg/dL   Protein, ur NEGATIVE NEGATIVE mg/dL   Nitrite NEGATIVE NEGATIVE   Leukocytes,Ua LARGE (A) NEGATIVE   RBC / HPF 0-5 0 - 5 RBC/hpf   WBC, UA 11-20 0 - 5 WBC/hpf   Bacteria, UA MANY (A) NONE SEEN   Squamous Epithelial / LPF 0-5 0 - 5   Mucus PRESENT     Comment: Performed at [facility], [place] [address] [place], [place] [address]  Resp Panel  by RT-PCR (Flu A&B, Covid) Nasopharyngeal Swab     Status:  None   Collection Time: 08/09/21  6:37 PM   Specimen: Nasopharyngeal Swab; Nasopharyngeal(NP) swabs in vial transport medium  Result Value Ref Range   SARS Coronavirus 2 by RT PCR NEGATIVE NEGATIVE    Comment: (NOTE) SARS-CoV-2 target nucleic acids are NOT DETECTED.  The SARS-CoV-2 RNA is generally detectable in upper respiratory specimens during the acute phase of infection. The lowest concentration of SARS-CoV-2 viral copies this assay can detect is 138 copies/mL. A negative result does not preclude SARS-Cov-2 infection and should not be used as the sole basis for treatment or other patient management decisions. A negative result may occur with  improper specimen collection/handling, submission of specimen other than nasopharyngeal swab, presence of viral mutation(s) within the areas targeted by this assay, and inadequate number of viral copies(<138 copies/mL). A negative result must be combined with clinical observations, patient history, and epidemiological information. The expected result is Negative.  Fact Sheet for Patients:  EntrepreneurPulse.com.au  Fact Sheet for Healthcare Providers:  IncredibleEmployment.be  This test is no t yet approved or cleared by the [place] FDA and  has been authorized for detection and/or diagnosis of SARS-CoV-2 by FDA under an Emergency Use Authorization (EUA). This EUA will remain  in effect (meaning this test can be used) for the duration of the COVID-19 declaration under Section 564(b)(1) of the Act, 21 U.S.C.section 360bbb-3(b)(1), unless the authorization is terminated  or revoked sooner.       Influenza A by PCR NEGATIVE NEGATIVE   Influenza B by PCR NEGATIVE NEGATIVE    Comment: (NOTE) The Xpert Xpress SARS-CoV-2/FLU/RSV plus assay is intended as an aid in the diagnosis of influenza from Nasopharyngeal swab specimens and should not  be used as a sole basis for treatment. Nasal washings and aspirates are unacceptable for Xpert Xpress SARS-CoV-2/FLU/RSV testing.  Fact Sheet for Patients: EntrepreneurPulse.com.au  Fact Sheet for Healthcare Providers: IncredibleEmployment.be  This test is not yet approved or cleared by the [place] FDA and has been authorized for detection and/or diagnosis of SARS-CoV-2 by FDA under an Emergency Use Authorization (EUA). This EUA will remain in effect (meaning this test can be used) for the duration of the COVID-19 declaration under Section 564(b)(1) of the Act, 21 U.S.C. section 360bbb-3(b)(1), unless the authorization is terminated or revoked.  Performed at [facility], [place] [address], [place], [place] [address]    CT HEAD WO CONTRAST (5MM)  Result Date: 08/09/2021 CLINICAL DATA:  Head trauma, minor (Age >= 65y); Neck trauma (Age >= 65y) EXAM: CT HEAD WITHOUT CONTRAST CT CERVICAL SPINE WITHOUT CONTRAST TECHNIQUE: Multidetector CT imaging of the head and cervical spine was performed following the standard protocol without intravenous contrast. Multiplanar CT image reconstructions of the cervical spine were also generated. COMPARISON:  CT cervical spine July 08, 2017. CT head March 07, 2020. FINDINGS: CT HEAD FINDINGS Brain: Similar 15 mm extra-axial mass, just posterior to the porous acoustic is of the left internal auditory canal along the petrous ridge. No significant mass effect. No evidence of acute large vascular territory infarct, acute hemorrhage, midline shift. Similar age related atrophy and chronic microvascular ischemic disease. Vascular: No hyperdense vessel identified. Calcific intracranial atherosclerosis. Skull: No acute fracture. Chronic severe right TMJ degenerative change. Sinuses/Orbits: Clear visualized sinuses. Other: No mastoid effusions. CT CERVICAL SPINE FINDINGS Alignment: Similar alignment. Similar 5 mm of  anterolisthesis of C7 on T1 and trace anterolisthesis of C3 on C4. Skull base and vertebrae: No evidence of acute fracture. Soft tissues and spinal  canal: No prevertebral fluid or swelling. No visible canal hematoma. Disc levels: Similar severe craniocervical degenerative change. Additionally, severe multilevel degenerative disease and facet arthropathy Upper chest: Biapical pleuroparenchymal scarring. Other: Carotid bifurcation atherosclerosis. IMPRESSION: CT head: 1. No evidence of acute intracranial abnormality. 2. Similar putative 15 mm meningioma just posterior to the left internal auditory canal. 3. Similar age related atrophy and chronic microvascular ischemic disease. CT cervical spine: 1. No evidence of acute fracture or traumatic malalignment. 2. Similar anterior C1 defect, discussed on 2018 prior. 3. Severe multilevel degenerative change. Electronically Signed   By: Margaretha Sheffield M.D.   On: 08/09/2021 17:47   CT Cervical Spine Wo Contrast  Result Date: 08/09/2021 CLINICAL DATA:  Head trauma, minor (Age >= 65y); Neck trauma (Age >= 65y) EXAM: CT HEAD WITHOUT CONTRAST CT CERVICAL SPINE WITHOUT CONTRAST TECHNIQUE: Multidetector CT imaging of the head and cervical spine was performed following the standard protocol without intravenous contrast. Multiplanar CT image reconstructions of the cervical spine were also generated. COMPARISON:  CT cervical spine July 08, 2017. CT head March 07, 2020. FINDINGS: CT HEAD FINDINGS Brain: Similar 15 mm extra-axial mass, just posterior to the porous acoustic is of the left internal auditory canal along the petrous ridge. No significant mass effect. No evidence of acute large vascular territory infarct, acute hemorrhage, midline shift. Similar age related atrophy and chronic microvascular ischemic disease. Vascular: No hyperdense vessel identified. Calcific intracranial atherosclerosis. Skull: No acute fracture. Chronic severe right TMJ degenerative change.  Sinuses/Orbits: Clear visualized sinuses. Other: No mastoid effusions. CT CERVICAL SPINE FINDINGS Alignment: Similar alignment. Similar 5 mm of anterolisthesis of C7 on T1 and trace anterolisthesis of C3 on C4. Skull base and vertebrae: No evidence of acute fracture. Soft tissues and spinal canal: No prevertebral fluid or swelling. No visible canal hematoma. Disc levels: Similar severe craniocervical degenerative change. Additionally, severe multilevel degenerative disease and facet arthropathy Upper chest: Biapical pleuroparenchymal scarring. Other: Carotid bifurcation atherosclerosis. IMPRESSION: CT head: 1. No evidence of acute intracranial abnormality. 2. Similar putative 15 mm meningioma just posterior to the left internal auditory canal. 3. Similar age related atrophy and chronic microvascular ischemic disease. CT cervical spine: 1. No evidence of acute fracture or traumatic malalignment. 2. Similar anterior C1 defect, discussed on 2018 prior. 3. Severe multilevel degenerative change. Electronically Signed   By: Margaretha Sheffield M.D.   On: 08/09/2021 17:47   DG Pelvis Portable  Result Date: 08/09/2021 CLINICAL DATA:  Altered mental status, status post fall. EXAM: PORTABLE PELVIS 1-2 VIEWS COMPARISON:  January 24, 2014 FINDINGS: There is no evidence of an acute pelvic fracture or diastasis. No pelvic bone lesions are seen. A total right hip replacement is seen without evidence of surrounding lucency to suggest the presence of hardware loosening or infection. A radiopaque fixation plate and compression screw device are seen within the proximal left femur. IMPRESSION: 1. No acute findings. 2. Right hip replacement and proximal left femoral fixation hardware without evidence of hardware complication. Electronically Signed   By: Virgina Norfolk M.D.   On: 08/09/2021 17:53   DG Chest Port 1 View  Result Date: 08/09/2021 CLINICAL DATA:  Altered mental status. EXAM: PORTABLE CHEST 1 VIEW COMPARISON:  January 02, 2019 FINDINGS: There is no evidence of acute infiltrate, pleural effusion or pneumothorax. Mild, stable left basilar atelectasis is seen the heart size and mediastinal contours are within normal limits. A chronic seventh right rib fracture is noted. Prior vertebroplasty is seen at the level of L1 vertebral  body. IMPRESSION: Predominantly stable exam without acute or active cardiopulmonary disease. Electronically Signed   By: Virgina Norfolk M.D.   On: 08/09/2021 17:47    Pending Labs Unresulted Labs (From admission, onward)     Start     Ordered   08/09/21 1623  Urine Culture  Once,   STAT       Question:  Indication  Answer:  Dysuria   08/09/21 1622            Vitals/Pain Today's Vitals   08/09/21 1630 08/09/21 1653 08/09/21 1852 08/09/21 1853  BP: (!) 204/80     Pulse:   86   Resp: (!) 21     Temp:    (!) 97.5 F (36.4 C)  TempSrc:    Axillary  SpO2:   100%   Weight:  40 kg    Height:  5' (1.524 m)      Isolation Precautions No active isolations  Medications Medications  sodium chloride 0.9 % bolus 500 mL (0 mLs Intravenous Stopped 08/09/21 1829)  LORazepam (ATIVAN) injection 1 mg (1 mg Intravenous Given 08/09/21 1700)  cefTRIAXone (ROCEPHIN) 1 g in sodium chloride 0.9 % 100 mL IVPB (0 g Intravenous Stopped 08/09/21 2009)    Mobility walks with device

## 2021-08-09 NOTE — ED Provider Notes (Addendum)
[facility] DEPT Provider Note   CSN: [id number] Arrival date & time: 08/09/21  1610     History Chief Complaint  Patient presents with   Urinary Tract Infection    [name] is a 87 y.o. female history of hypertension, previous pelvic fracture, here presenting with fall, possible UTI, agitation.  Patient is from [facility] independent living.  Per the staff, patient recently had UTI and finished treatment.  They cannot tell me what she was on and there was no antibiotics on the transfer paperwork.  Apparently she has been more agitated than usual.  She has been hitting other staff and refusing meds.  She also fell several days ago and may have hit her head.  She was noted to be combative per EMS.  The history is provided by the patient and the EMS personnel.      Past Medical History:  Diagnosis Date   Cancer of the skin, basal cell 2008   FACE   Degenerative joint disease (DJD) of hip    Depression    Gait disorder    Hypertension    Hypokalemia    Iron deficiency anemia    Osteoporosis    Pelvic fracture [facility])     Patient Active Problem List   Diagnosis Date Noted   Normocytic anemia 01/04/2019   Leukocytosis 01/02/2019   Pubic ramus fracture ([place]) 01/02/2019   Hip pain 01/02/2019   Essential hypertension 01/02/2019   Hypokalemia 07/09/2017   Subdural hematoma 07/08/2017   Frequent falls 07/08/2017   Acute lower UTI 40/98/1191   Acute metabolic encephalopathy 47/82/9562   Fall     Past Surgical History:  Procedure Laterality Date   CATARACT EXTRACTION, BILATERAL     HIP FRACTURE SURGERY  09/2004   DR. WHITFIELD   NERVE ENTRAPMENT     LEFT THUMB   SPINAL FUSION  1996   L3-4 [facility]   WRIST FRACTURE SURGERY     DR. Daylene Katayama     OB History   No obstetric history on file.     Family History  Problem Relation Age of Onset   Heart failure Mother    Hypertension Father    Diabetes Sister     Social  History   Tobacco Use   Smoking status: Never   Smokeless tobacco: Never  Substance Use Topics   Alcohol use: No   Drug use: No    Home Medications Prior to Admission medications   Medication Sig Start Date End Date Taking? Authorizing Provider  acetaminophen (TYLENOL) 650 MG CR tablet Take 650 mg by mouth every 8 (eight) hours as needed for pain.    [provider]  amLODipine (NORVASC) 2.5 MG tablet Take 2.5 mg by mouth daily.    [provider]  Calcium Carbonate-Vitamin D (CALTRATE 600+D) 600-400 MG-UNIT per tablet Take 1 tablet by mouth every evening.     [provider]  cephALEXin (KEFLEX) 250 MG capsule Take 1 capsule (250 mg total) by mouth 3 (three) times daily. 01/04/19   Norval Morton, MD  HYDROcodone-acetaminophen (NORCO/VICODIN) 5-325 MG tablet Take 1-2 tablets by mouth every 6 (six) hours as needed for moderate pain. 01/04/19   Norval Morton, MD  Multiple Vitamin (MULTIVITAMIN WITH MINERALS) TABS tablet Take 1 tablet by mouth daily.    [provider]  ondansetron (ZOFRAN) 4 MG tablet Take 1 tablet (4 mg total) by mouth every 8 (eight) hours as needed for nausea or vomiting.  03/11/20   Hayden Rasmussen, MD    Allergies    Dicloxacillin, Other, Pork-derived products, and Septra [sulfamethoxazole-trimethoprim]  Review of Systems   Review of Systems  Psychiatric/Behavioral:  Positive for agitation.   All other systems reviewed and are negative.  Physical Exam Updated Vital Signs Ht 5' (1.524 m)   Wt 40 kg   BMI 17.22 kg/m   Physical Exam Vitals and nursing note reviewed.  Constitutional:      Comments: slightly agitated and hard of hearing  HENT:     Head: Atraumatic.     Nose: Nose normal.     Mouth/Throat:     Mouth: Mucous membranes are moist.  Eyes:     Extraocular Movements: Extraocular movements intact.     Pupils: Pupils are equal, round, and reactive to light.  Cardiovascular:     Rate and Rhythm: Normal rate  and regular rhythm.     Pulses: Normal pulses.  Pulmonary:     Effort: Pulmonary effort is normal.     Breath sounds: Normal breath sounds.  Abdominal:     General: Abdomen is flat.     Palpations: Abdomen is soft.  Musculoskeletal:        General: Normal range of motion.     Cervical back: Normal range of motion and neck supple.  Skin:    General: Skin is warm.     Capillary Refill: Capillary refill takes less than 2 seconds.     Comments: Bruising on the right shin.  No obvious ecchymosis on back   Neurological:     Comments: Demented, combative, moving all extremities.  Difficulty following commands  Psychiatric:     Comments: Slightly agitated    ED Results / Procedures / Treatments   Labs (all labs ordered are listed, but only abnormal results are displayed) Labs Reviewed  COMPREHENSIVE METABOLIC PANEL - Abnormal; Notable for the following components:      Result Value   Potassium 3.4 (*)    Glucose, Bld 101 (*)    Total Bilirubin 1.5 (*)    All other components within normal limits  URINALYSIS, ROUTINE W REFLEX MICROSCOPIC - Abnormal; Notable for the following components:   APPearance HAZY (*)    Hgb urine dipstick MODERATE (*)    Leukocytes,Ua LARGE (*)    Bacteria, UA MANY (*)    All other components within normal limits  URINE CULTURE  RESP PANEL BY RT-PCR (FLU A&B, COVID) ARPGX2  CBC WITH DIFFERENTIAL/PLATELET    EKG EKG Interpretation  Date/Time:  Thursday August 09 2021 16:50:25 EDT Ventricular Rate:  79 PR Interval:  161 QRS Duration: 97 QT Interval:  396 QTC Calculation: 454 R Axis:   -45 Text Interpretation: Sinus rhythm Left anterior fascicular block Abnormal R-wave progression, late transition Left ventricular hypertrophy No significant change since last tracing Confirmed by Wandra Arthurs [phone]) on 08/09/2021 4:55:19 PM  Radiology CT HEAD WO CONTRAST (5MM)  Result Date: 08/09/2021 CLINICAL DATA:  Head trauma, minor (Age >= 65y); Neck trauma  (Age >= 65y) EXAM: CT HEAD WITHOUT CONTRAST CT CERVICAL SPINE WITHOUT CONTRAST TECHNIQUE: Multidetector CT imaging of the head and cervical spine was performed following the standard protocol without intravenous contrast. Multiplanar CT image reconstructions of the cervical spine were also generated. COMPARISON:  CT cervical spine July 08, 2017. CT head March 07, 2020. FINDINGS: CT HEAD FINDINGS Brain: Similar 15 mm extra-axial mass, just posterior to the porous acoustic is of the left internal auditory canal along  the petrous ridge. No significant mass effect. No evidence of acute large vascular territory infarct, acute hemorrhage, midline shift. Similar age related atrophy and chronic microvascular ischemic disease. Vascular: No hyperdense vessel identified. Calcific intracranial atherosclerosis. Skull: No acute fracture. Chronic severe right TMJ degenerative change. Sinuses/Orbits: Clear visualized sinuses. Other: No mastoid effusions. CT CERVICAL SPINE FINDINGS Alignment: Similar alignment. Similar 5 mm of anterolisthesis of C7 on T1 and trace anterolisthesis of C3 on C4. Skull base and vertebrae: No evidence of acute fracture. Soft tissues and spinal canal: No prevertebral fluid or swelling. No visible canal hematoma. Disc levels: Similar severe craniocervical degenerative change. Additionally, severe multilevel degenerative disease and facet arthropathy Upper chest: Biapical pleuroparenchymal scarring. Other: Carotid bifurcation atherosclerosis. IMPRESSION: CT head: 1. No evidence of acute intracranial abnormality. 2. Similar putative 15 mm meningioma just posterior to the left internal auditory canal. 3. Similar age related atrophy and chronic microvascular ischemic disease. CT cervical spine: 1. No evidence of acute fracture or traumatic malalignment. 2. Similar anterior C1 defect, discussed on 2018 prior. 3. Severe multilevel degenerative change. Electronically Signed   By: Margaretha Sheffield M.D.   On:  08/09/2021 17:47   CT Cervical Spine Wo Contrast  Result Date: 08/09/2021 CLINICAL DATA:  Head trauma, minor (Age >= 65y); Neck trauma (Age >= 65y) EXAM: CT HEAD WITHOUT CONTRAST CT CERVICAL SPINE WITHOUT CONTRAST TECHNIQUE: Multidetector CT imaging of the head and cervical spine was performed following the standard protocol without intravenous contrast. Multiplanar CT image reconstructions of the cervical spine were also generated. COMPARISON:  CT cervical spine July 08, 2017. CT head March 07, 2020. FINDINGS: CT HEAD FINDINGS Brain: Similar 15 mm extra-axial mass, just posterior to the porous acoustic is of the left internal auditory canal along the petrous ridge. No significant mass effect. No evidence of acute large vascular territory infarct, acute hemorrhage, midline shift. Similar age related atrophy and chronic microvascular ischemic disease. Vascular: No hyperdense vessel identified. Calcific intracranial atherosclerosis. Skull: No acute fracture. Chronic severe right TMJ degenerative change. Sinuses/Orbits: Clear visualized sinuses. Other: No mastoid effusions. CT CERVICAL SPINE FINDINGS Alignment: Similar alignment. Similar 5 mm of anterolisthesis of C7 on T1 and trace anterolisthesis of C3 on C4. Skull base and vertebrae: No evidence of acute fracture. Soft tissues and spinal canal: No prevertebral fluid or swelling. No visible canal hematoma. Disc levels: Similar severe craniocervical degenerative change. Additionally, severe multilevel degenerative disease and facet arthropathy Upper chest: Biapical pleuroparenchymal scarring. Other: Carotid bifurcation atherosclerosis. IMPRESSION: CT head: 1. No evidence of acute intracranial abnormality. 2. Similar putative 15 mm meningioma just posterior to the left internal auditory canal. 3. Similar age related atrophy and chronic microvascular ischemic disease. CT cervical spine: 1. No evidence of acute fracture or traumatic malalignment. 2. Similar anterior  C1 defect, discussed on 2018 prior. 3. Severe multilevel degenerative change. Electronically Signed   By: Margaretha Sheffield M.D.   On: 08/09/2021 17:47   DG Pelvis Portable  Result Date: 08/09/2021 CLINICAL DATA:  Altered mental status, status post fall. EXAM: PORTABLE PELVIS 1-2 VIEWS COMPARISON:  January 24, 2014 FINDINGS: There is no evidence of an acute pelvic fracture or diastasis. No pelvic bone lesions are seen. A total right hip replacement is seen without evidence of surrounding lucency to suggest the presence of hardware loosening or infection. A radiopaque fixation plate and compression screw device are seen within the proximal left femur. IMPRESSION: 1. No acute findings. 2. Right hip replacement and proximal left femoral fixation hardware without evidence of  hardware complication. Electronically Signed   By: Virgina Norfolk M.D.   On: 08/09/2021 17:53   DG Chest Port 1 View  Result Date: 08/09/2021 CLINICAL DATA:  Altered mental status. EXAM: PORTABLE CHEST 1 VIEW COMPARISON:  January 02, 2019 FINDINGS: There is no evidence of acute infiltrate, pleural effusion or pneumothorax. Mild, stable left basilar atelectasis is seen the heart size and mediastinal contours are within normal limits. A chronic seventh right rib fracture is noted. Prior vertebroplasty is seen at the level of L1 vertebral body. IMPRESSION: Predominantly stable exam without acute or active cardiopulmonary disease. Electronically Signed   By: Virgina Norfolk M.D.   On: 08/09/2021 17:47    Procedures Procedures   Medications Ordered in ED Medications  cefTRIAXone (ROCEPHIN) 1 g in sodium chloride 0.9 % 100 mL IVPB (has no administration in time range)  sodium chloride 0.9 % bolus 500 mL (500 mLs Intravenous New Bag/Given 08/09/21 1652)  LORazepam (ATIVAN) injection 1 mg (1 mg Intravenous Given 08/09/21 1700)    ED Course  I have reviewed the triage vital signs and the nursing notes.  Pertinent labs & imaging results  that were available during my care of the patient were reviewed by me and considered in my medical decision making (see chart for details).    MDM Rules/Calculators/A&P                           NATAJAH DERDERIAN is a 87 y.o. female here presenting with agitation and possible fall.  She has bruising all over.  She also recently had a UTI.  Plan to get CBC and CMP and CT head and neck and chest x-ray and urinalysis   6:21 PM UA + UTI.  CT head showed old meningioma.  Daughter is now at bedside.  She states that mother is in [facility] independent living and she is now herself.  She is still pulling at everything.  She confirmed that patient is DNR.  However she wants patient admitted for encephalopathy secondary to UTI.  Final Clinical Impression(s) / ED Diagnoses Final diagnoses:  None    Rx / DC Orders ED Discharge Orders     None        Drenda Freeze, MD 08/09/21 Priscella Mann, MD 08/09/21 [phone]

## 2021-08-10 ENCOUNTER — Encounter (HOSPITAL_COMMUNITY): Payer: Self-pay | Admitting: Internal Medicine

## 2021-08-10 DIAGNOSIS — N390 Urinary tract infection, site not specified: Principal | ICD-10-CM

## 2021-08-10 LAB — COMPREHENSIVE METABOLIC PANEL
ALT: 13 U/L (ref 0–44)
AST: 22 U/L (ref 15–41)
Albumin: 3.2 g/dL — ABNORMAL LOW (ref 3.5–5.0)
Alkaline Phosphatase: 58 U/L (ref 38–126)
Anion gap: 13 (ref 5–15)
BUN: 14 mg/dL (ref 8–23)
CO2: 22 mmol/L (ref 22–32)
Calcium: 8.6 mg/dL — ABNORMAL LOW (ref 8.9–10.3)
Chloride: 103 mmol/L (ref 98–111)
Creatinine, Ser: 0.48 mg/dL (ref 0.44–1.00)
GFR, Estimated: >60 mL/min (ref >60)
Glucose, Bld: 95 mg/dL (ref 70–99)
Potassium: 3.5 mmol/L (ref 3.5–5.1)
Sodium: 138 mmol/L (ref 135–145)
Total Bilirubin: 1.3 mg/dL — ABNORMAL HIGH (ref 0.3–1.2)
Total Protein: 7 g/dL (ref 6.5–8.1)

## 2021-08-10 LAB — CBC WITH DIFFERENTIAL/PLATELET
Abs Immature Granulocytes: 0.03 10*3/uL (ref 0.00–0.07)
Basophils Absolute: 0 10*3/uL (ref 0.0–0.1)
Basophils Relative: 1 %
Eosinophils Absolute: 0.2 10*3/uL (ref 0.0–0.5)
Eosinophils Relative: 3 %
HCT: 38.9 % (ref 36.0–46.0)
Hemoglobin: 12.9 g/dL (ref 12.0–15.0)
Immature Granulocytes: 0 %
Lymphocytes Relative: 28 %
Lymphs Abs: 2 10*3/uL (ref 0.7–4.0)
MCH: 32.3 pg (ref 26.0–34.0)
MCHC: 33.2 g/dL (ref 30.0–36.0)
MCV: 97.3 fL (ref 80.0–100.0)
Monocytes Absolute: 0.9 10*3/uL (ref 0.1–1.0)
Monocytes Relative: 13 %
Neutro Abs: 4 10*3/uL (ref 1.7–7.7)
Neutrophils Relative %: 55 %
Platelets: 360 10*3/uL (ref 150–400)
RBC: 4 MIL/uL (ref 3.87–5.11)
RDW: 12.7 % (ref 11.5–15.5)
WBC: 7.1 10*3/uL (ref 4.0–10.5)
nRBC: 0 % (ref 0.0–0.2)

## 2021-08-10 LAB — MAGNESIUM: Magnesium: 2 mg/dL (ref 1.7–2.4)

## 2021-08-10 LAB — PROCALCITONIN: Procalcitonin: <0.1 ng/mL

## 2021-08-10 MED ORDER — ADULT MULTIVITAMIN W/MINERALS CH
1.0000 | ORAL_TABLET | Freq: Every day | ORAL | Status: DC
  Administered 2021-08-10 – 2021-08-11 (×2): 1 via ORAL
  Filled 2021-08-10 (×2): qty 1

## 2021-08-10 MED ORDER — POTASSIUM CHLORIDE CRYS ER 20 MEQ PO TBCR
40.0000 meq | EXTENDED_RELEASE_TABLET | Freq: Once | ORAL | Status: AC
  Administered 2021-08-10: 40 meq via ORAL
  Filled 2021-08-10: qty 2

## 2021-08-10 NOTE — TOC Initial Note (Signed)
Transition of Care [facility]) - Initial/Assessment Note    Patient Details  Name: [name] MRN: [id number] Date of Birth: 04/13/1919  Transition of [facility]) CM/SW Contact:    Trish Mage, LCSW Phone Number: 08/10/2021, 2:43 PM  Clinical Narrative:   Spoke with Denton Ar, nurse at [facility] who confirms they can receive patient back over weekend.  No FL2 required.  Please call Brianna at [phone] to confirm d/c and for RN report.  FAX d/c summary to [phone]. Daughter will provide transportation. TOC will continue to follow during the course of hospitalization.                 Expected Discharge Plan:  ([facility]) Barriers to Discharge: No Barriers Identified   Patient Goals and CMS Choice        Expected Discharge Plan and Services Expected Discharge Plan:  ([facility])                                              Prior Living Arrangements/Services                       Activities of Daily Living Home Assistive Devices/Equipment: Environmental consultant (specify type) ADL Screening (condition at time of admission) Patient's cognitive ability adequate to safely complete daily activities?: No Is the patient deaf or have difficulty hearing?: Yes Does the patient have difficulty seeing, even when wearing glasses/contacts?: No Does the patient have difficulty concentrating, remembering, or making decisions?: Yes Patient able to express need for assistance with ADLs?: No Does the patient have difficulty dressing or bathing?: Yes Independently performs ADLs?: No Communication: Independent Dressing (OT): Needs assistance Is this a change from baseline?: Pre-admission baseline Grooming: Needs assistance Is this a change from baseline?: Pre-admission baseline Feeding: Needs assistance Is this a change from baseline?: Pre-admission baseline Bathing: Needs assistance Is this a change from baseline?: Pre-admission baseline Toileting: Needs  assistance Is this a change from baseline?: Pre-admission baseline In/Out Bed: Needs assistance Is this a change from baseline?: Pre-admission baseline Walks in Home: Needs assistance (uses a walker) Is this a change from baseline?: Pre-admission baseline Does the patient have difficulty walking or climbing stairs?: Yes Weakness of Legs: Both Weakness of Arms/Hands: Both  Permission Sought/Granted                  Emotional Assessment              Admission diagnosis:  Encephalopathy [G93.40] Acute cystitis without hematuria [F64.33] Acute metabolic encephalopathy [I95.18] Patient Active Problem List   Diagnosis Date Noted   Meningioma ([place]) 08/09/2021   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  [place] [address] Phone: [phone] Fax: [phone]     Social Determinants of Health (SDOH) Interventions    Readmission Risk Interventions No flowsheet data found.

## 2021-08-10 NOTE — Progress Notes (Signed)
Pt received in room 1617. Alert and restless. Daughter at bedside. Vitals taken and pt made comfortable.

## 2021-08-10 NOTE — Progress Notes (Signed)
Initial Nutrition Assessment  DOCUMENTATION CODES:   Underweight  INTERVENTION:   -Ensure Enlive po BID, each supplement provides 350 kcal and 20 grams of protein  -Multivitamin with minerals daily  NUTRITION DIAGNOSIS:   Inadequate oral intake related to lethargy/confusion as evidenced by per patient/family report.  GOAL:   Patient will meet greater than or equal to 90% of their needs  MONITOR:   PO intake, Supplement acceptance, Labs, Weight trends, I & O's  REASON FOR ASSESSMENT:   Malnutrition Screening Tool    ASSESSMENT:   87 y.o. female with medical history significant for hypertension, iron deficiency anemia, hearing impairment, recent UTI about 2 weeks prior to admission for which she completed a course of Keflex.  She  presented to the hospital from the assisted living facility because of worsening confusion.  Her daughter said she normally behaves this way when she has a UTI.    She was admitted to the hospital for acute UTI.  Unable to speak with pt, pt working with therapies.  Per chart review, pt has had increased confusion r/t UTI with resulting poor appetite. MD checked B-12 and folate, all WNL. Ensure supplements have been ordered for additional kcals and protein. Will add daily MVI as well. Consuming 50-75% of meals at this time.   Per weight records, pt weighed 109 lbs on 6/30, that is a 19% wt loss x 4 months, significant for time frame. Suspect some degree of malnutrition. Advanced age could play a factor as well.  Per nursing documentation, pt with mild BLE edema.  Medications: KLOR-CON   Labs reviewed.  NUTRITION - FOCUSED PHYSICAL EXAM:  Unable to perform  Diet Order:   Diet Order             Diet Heart Room service appropriate? Yes; Fluid consistency: Thin  Diet effective now                   EDUCATION NEEDS:   No education needs have been identified at this time  Skin:  Skin Assessment: Reviewed RN Assessment  Last BM:   PTA  Height:   Ht Readings from Last 1 Encounters:  08/09/21 5' (1.524 m)    Weight:   Wt Readings from Last 1 Encounters:  08/09/21 40 kg    BMI:  Body mass index is 17.22 kg/m.  Estimated Nutritional Needs:   Kcal:  1250-1450  Protein:  65-75g  Fluid:  1.5L/day   Clayton Bibles, MS, RD, LDN Inpatient Clinical Dietitian Contact information available via Amion

## 2021-08-10 NOTE — Evaluation (Signed)
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  Past medical history of L superior pubic rami fracture in 2018, Cancer of the skin, basal cell (2008), Degenerative joint disease (DJD) of hip, Depression, Gait disorder, Hypertension, Hypokalemia, Iron deficiency anemia, Osteoporosis, and Pelvic fracture ([place]).   Clinical Impression   Patient is currently requiring assistance with ADLs including moderate to maximum assist with toileting, LE dressing, and with bathing, and minimal assist with bed mobility, transfers and limited in-room ambulation with RW and frequent posterior losses of balance which family stated occurs at baseline with subsequent falls.  Current level of function is below patient's typical baseline.  During this evaluation, patient was limited by generalized weakness, impaired activity tolerance, and change in mentation, as well as baseline deafness and posterior losses of balance, all of which has the potential to impact patient's safety and independence during functional mobility, as well as performance for ADLs.  Patient lives at [facility] with near 24/7 supervision and assistance.  Patient demonstrates good rehab potential, and should benefit from continued skilled occupational therapy services while in acute care to maximize safety, independence and quality of life at home.   ?     Recommendations for follow up therapy are one component of a multi-disciplinary discharge planning process, led by the attending physician.  Recommendations may be updated based on patient status, additional functional criteria and insurance authorization.   Follow Up Recommendations  No OT follow up    Assistance Recommended at  Discharge Frequent or constant Supervision/Assistance  Functional Status Assessment  Patient has had a recent decline in their functional status and demonstrates the ability to make significant improvements in function in a reasonable and predictable amount of time.  Equipment Recommendations  None recommended by OT (Spoke with family re: possible trial of upwalker with pt's PT at facility.)    Recommendations for Other Services PT consult     Precautions / Restrictions Precautions Precautions: Fall Precaution Comments: Pt falls posteriorly and fall frequently at her ALF.  Pt is deaf and able to read instructions well. Restrictions Weight Bearing Restrictions: No      Mobility Bed Mobility Overal bed mobility: Needs Assistance Bed Mobility: Supine to Sit     Supine to sit: Min assist;HOB elevated     General bed mobility comments: Min HHA for trunk.    Transfers Overall transfer level: Needs assistance Equipment used: Rolling walker (2 wheels) Transfers: Sit to/from Stand Sit to Stand: Min guard;Min assist                  Balance Overall balance assessment: Needs assistance Sitting-balance support: Bilateral upper extremity supported Sitting balance-Leahy Scale: Fair       Standing balance-Leahy Scale: Poor Standing balance comment: Posterior LOB, frequent falls, requires BUE support on RW.                           ADL either performed or assessed with clinical judgement   ADL Overall ADL's : Needs assistance/impaired Eating/Feeding: Set up;Minimal assistance;Sitting   Grooming: Oral care;Minimal assistance;Set up;Sitting Grooming Details (indicate cue type and reason): Min assist to wipe mouth after pt expectorated into kidney basin. Upper Body Bathing: Minimal assistance;Sitting  Lower Body Bathing: Moderate assistance;Sit to/from stand   Upper Body Dressing : Minimal assistance;Sitting   Lower Body Dressing: Moderate  assistance;Sitting/lateral leans;Sit to/from stand   Toilet Transfer: Minimal assistance;Min guard;Rolling walker (2 wheels) Toilet Transfer Details (indicate cue type and reason): Pt ambulated around bed to recliner with RW and Min As with constant light tactile cuing to promote anterior weight shift due to frequent posterior leaning/losses of balance. Toileting- Clothing Manipulation and Hygiene: Moderate assistance;Sit to/from stand;Sitting/lateral lean       Functional mobility during ADLs: Min guard;Minimal assistance;Rolling walker (2 wheels)       Vision Baseline Vision/History: 0 No visual deficits Ability to See in Adequate Light: 0 Adequate Patient Visual Report: No change from baseline Vision Assessment?: No apparent visual deficits Additional Comments: 20/15 vision per family. Does not need glasses.     Perception     Praxis      Pertinent Vitals/Pain Pain Assessment: Faces Breathing: normal Negative Vocalization: none Facial Expression: smiling or inexpressive Body Language: relaxed Consolability: distracted or reassured by voice/touch PAINAD Score: 1 Pain Location: Per family, pt was pointing to her Bil hips earlier with facial grimace. Imaging without acute fracture to pelvis/hip areas. Pt made no c/o pain during OT assessment. Pain Intervention(s): Monitored during session     Hand Dominance     Extremity/Trunk Assessment Upper Extremity Assessment Upper Extremity Assessment: Overall WFL for tasks assessed   Lower Extremity Assessment Lower Extremity Assessment: Defer to PT evaluation   Cervical / Trunk Assessment Cervical / Trunk Assessment: Kyphotic   Communication Communication Communication: Deaf   Cognition Arousal/Alertness: Awake/alert Behavior During Therapy: WFL for tasks assessed/performed Overall Cognitive Status: History of cognitive impairments - at baseline                                 General Comments: Per family,  pt seems to be returning to baseline mentation and now appears weaker than usual. Pt likes to be active and self-initiated UE and LE exercises while up in recliner.     General Comments       Exercises Other Exercises Other Exercises: Pt independently moving UEs and LEs for several minutes in recliner. Pt given gait belt and began dowel style BUE AROM exercises over head. No stiffness noted to extremities or trunk. Family states that pt is very aware of preventing blood clots and knows to move.   Shoulder Instructions      Home Living Family/patient expects to be discharged to:: Assisted living                                 Additional Comments: From [facility].      Prior Functioning/Environment Prior Level of Function : Needs assist;History of Falls (last six months)  Cognitive Assist : ADLs (cognitive)   ADLs (Cognitive): Set up cues Physical Assist : Mobility (physical);ADLs (physical) Mobility (physical): Gait;Transfers ADLs (physical): Bathing;Dressing;Toileting;IADLs Mobility Comments: Pt ambuates with her 2 wheeled RW at all times but falls posteriorly fairly frequently with past fractures per medical history. Pt is active with PT "Belenda Cruise" per pt report and likes to exrecise. Pt needs Min guard to [place] with ambulation at baseline and can usually walk down long hallways without fatigue. ADLs Comments: Pt feeds self finger foods and performs grooming in chair with setup/visual cues. Pt is assisted with LB ADLs and  some UB ADLs at baseline.        OT Problem List: Decreased cognition;Decreased activity tolerance;Impaired balance (sitting and/or standing);Decreased knowledge of use of DME or AE      OT Treatment/Interventions:      OT Goals(Current goals can be found in the care plan section) Acute Rehab OT Goals Patient Stated Goal: Per family, for pt to regain ability to ambulate normal distances and reduce fall risk. OT Goal Formulation: With  family Time For Goal Achievement: 08/24/21 Potential to Achieve Goals: Fair ADL Goals Pt Will Perform Grooming: with min guard assist;standing Pt Will Transfer to Toilet: with min guard assist;ambulating;regular height toilet;grab bars Additional ADL Goal #1: Pt will engage in at least 5 min standing functional activities without loss of balance or signs of combativeness, in order to demonstrate improved activity tolerance and balance needed to perform ADLs safely at home.  OT Frequency:     Barriers to D/C:            Co-evaluation              AM-PAC OT "6 Clicks" Daily Activity     Outcome Measure Help from another person eating meals?: A Little Help from another person taking care of personal grooming?: A Little Help from another person toileting, which includes using toliet, bedpan, or urinal?: A Lot Help from another person bathing (including washing, rinsing, drying)?: A Lot Help from another person to put on and taking off regular upper body clothing?: A Lot Help from another person to put on and taking off regular lower body clothing?: A Lot 6 Click Score: 14   End of Session Equipment Utilized During Treatment: Gait belt;Rolling walker (2 wheels) Nurse Communication: Mobility status;Other (comment) (No chair alarm as did not anticipate pt ability to move to chair, but family present and family knows to contact RN before leaving pt.)  Activity Tolerance: Patient tolerated treatment well;Patient limited by fatigue Patient left: in chair;with family/visitor present  OT Visit Diagnosis: Unsteadiness on feet (R26.81);Repeated falls (R29.6);History of falling (Z91.81);Other symptoms and signs involving cognitive function                Time: 5956-3875 OT Time Calculation (min): 33 min Charges:  OT General Charges $OT Visit: 1 Visit OT Evaluation $OT Eval Low Complexity: 1 Low OT Treatments $Self Care/Home Management : 8-22 mins  Anderson [place], [place] Office: [phone] 08/10/2021 Julien Girt 08/10/2021, 1:33 PM

## 2021-08-10 NOTE — Progress Notes (Addendum)
Progress Note    [name]  GUY:[id number] DOB: July 31, 1919  DOA: 08/09/2021 PCP: Gaynelle Arabian, MD      Brief Narrative:    Medical records reviewed and are as summarized below:  [name] is a 87 y.o. female with medical history significant for hypertension, iron deficiency anemia, hearing impairment, recent UTI about 2 weeks prior to admission for which she completed a course of Keflex.  She  presented to the hospital from the assisted living facility because of worsening confusion.  Her daughter said she normally behaves this way when she has a UTI.  She was admitted to the hospital for acute UTI.  She was treated with IV Rocephin and IV fluids.      Assessment/Plan:   Principal Problem:   Acute metabolic encephalopathy Active Problems:   Acute lower UTI   Hypokalemia, inadequate intake   Essential hypertension   Meningioma (HCC)    Body mass index is 17.22 kg/m.  Acute UTI: Continue IV Rocephin.  Follow-up urine and blood cultures.  Acute metabolic encephalopathy: Improving.  Hypokalemia: Improved.  Continue potassium repletion.  Meningioma: Outpatient follow-up with PCP    Diet Order             Diet Heart Room service appropriate? Yes; Fluid consistency: Thin  Diet effective now                      Consultants: None  Procedures: None    Medications:    amLODipine  2.5 mg Oral Daily   aspirin EC  81 mg Oral Daily   feeding supplement  237 mL Oral BID BM   potassium chloride  40 mEq Oral Once   Continuous Infusions:  cefTRIAXone (ROCEPHIN)  IV       Anti-infectives (From admission, onward)    Start     Dose/Rate Route Frequency Ordered Stop   08/10/21 1800  cefTRIAXone (ROCEPHIN) 1 g in sodium chloride 0.9 % 100 mL IVPB        1 g 200 mL/hr over 30 Minutes Intravenous Every 24 hours 08/09/21 2048 08/12/21 1759   08/09/21 1800  cefTRIAXone (ROCEPHIN) 1 g in sodium chloride 0.9 % 100 mL IVPB        1  g 200 mL/hr over 30 Minutes Intravenous  Once 08/09/21 1759 08/09/21 2009              Family Communication/Anticipated D/C date and plan/Code Status   DVT prophylaxis: SCDs Start: 08/09/21 2058     Code Status: DNR  Family Communication: Daughter at the bedside Disposition Plan: Possible discharge to independent living facility  in 1 to 2 days   Status is: Inpatient  Remains inpatient appropriate because: On IV antibiotics for acute UTI           Subjective:   Interval events noted.  Her daughter was at the bedside.  Daughter said patient was belligerent yesterday but acknowledges that mental status is better today.  Objective:    Vitals:   08/09/21 2213 08/10/21 0209 08/10/21 0633 08/10/21 1026  BP: (!) 143/63 139/76 (!) 156/70 (!) 147/64  Pulse: 77 70 65 71  Resp: 18 14 17 18   Temp: 97.7 F (36.5 C) 98.5 F (36.9 C) 97.6 F (36.4 C) 97.6 F (36.4 C)  TempSrc: Oral Oral Oral Oral  SpO2: 95% 97% 96% 97%  Weight:      Height:       No data  found.   Intake/Output Summary (Last 24 hours) at 08/10/2021 1235 Last data filed at 08/10/2021 0654 Gross per 24 hour  Intake 100 ml  Output 150 ml  Net -50 ml   Filed Weights   08/09/21 1653  Weight: 40 kg    Exam:  GEN: NAD SKIN: No rash EYES: EOMI ENT: MMM, hard of hearing CV: RRR PULM: CTA B ABD: soft, ND, NT, +BS CNS: AAO x person and place, non focal EXT: No edema or tenderness        Data Reviewed:   I have personally reviewed following labs and imaging studies:  Labs: Labs show the following:   Basic Metabolic Panel: Recent Labs  Lab 08/09/21 1623 08/10/21 0515  NA 139 138  K 3.4* 3.5  CL 103 103  CO2 25 22  GLUCOSE 101* 95  BUN 19 14  CREATININE 0.74 0.48  CALCIUM 9.2 8.6*  MG  --  2.0   GFR Estimated Creatinine Clearance: 24.2 mL/min (by C-G formula based on SCr of 0.48 mg/dL). Liver Function Tests: Recent Labs  Lab 08/09/21 1623 08/10/21 0515  AST 23 22   ALT 14 13  ALKPHOS 72 58  BILITOT 1.5* 1.3*  PROT 7.9 7.0  ALBUMIN 3.9 3.2*   No results for input(s): LIPASE, AMYLASE in the last 168 hours. Recent Labs  Lab 08/09/21 2153  AMMONIA 18   Coagulation profile No results for input(s): INR, PROTIME in the last 168 hours.  CBC: Recent Labs  Lab 08/09/21 1623 08/10/21 0515  WBC 8.1 7.1  NEUTROABS 4.9 4.0  HGB 13.9 12.9  HCT 41.9 38.9  MCV 96.8 97.3  PLT 386 360   Cardiac Enzymes: No results for input(s): CKTOTAL, CKMB, CKMBINDEX, TROPONINI in the last 168 hours. BNP (last 3 results) No results for input(s): PROBNP in the last 8760 hours. CBG: No results for input(s): GLUCAP in the last 168 hours. D-Dimer: No results for input(s): DDIMER in the last 72 hours. Hgb A1c: No results for input(s): HGBA1C in the last 72 hours. Lipid Profile: No results for input(s): CHOL, HDL, LDLCALC, TRIG, CHOLHDL, LDLDIRECT in the last 72 hours. Thyroid function studies: Recent Labs    08/09/21 2153  TSH 3.646   Anemia work up: Recent Labs    08/09/21 2153  VITAMINB12 807  FOLATE 35.2   Sepsis Labs: Recent Labs  Lab 08/09/21 1623 08/09/21 2153 08/10/21 0515  PROCALCITON  --  <0.10  --   WBC 8.1  --  7.1    Microbiology Recent Results (from the past 240 hour(s))  Resp Panel by RT-PCR (Flu A&B, Covid) Nasopharyngeal Swab     Status: None   Collection Time: 08/09/21  6:37 PM   Specimen: Nasopharyngeal Swab; Nasopharyngeal(NP) swabs in vial transport medium  Result Value Ref Range Status   SARS Coronavirus 2 by RT PCR NEGATIVE NEGATIVE Final    Comment: (NOTE) SARS-CoV-2 target nucleic acids are NOT DETECTED.  The SARS-CoV-2 RNA is generally detectable in upper respiratory specimens during the acute phase of infection. The lowest concentration of SARS-CoV-2 viral copies this assay can detect is 138 copies/mL. A negative result does not preclude SARS-Cov-2 infection and should not be used as the sole basis for treatment  or other patient management decisions. A negative result may occur with  improper specimen collection/handling, submission of specimen other than nasopharyngeal swab, presence of viral mutation(s) within the areas targeted by this assay, and inadequate number of viral copies(<138 copies/mL). A negative result must be  combined with clinical observations, patient history, and epidemiological information. The expected result is Negative.  Fact Sheet for Patients:  EntrepreneurPulse.com.au  Fact Sheet for Healthcare Providers:  IncredibleEmployment.be  This test is no t yet approved or cleared by the [place] FDA and  has been authorized for detection and/or diagnosis of SARS-CoV-2 by FDA under an Emergency Use Authorization (EUA). This EUA will remain  in effect (meaning this test can be used) for the duration of the COVID-19 declaration under Section 564(b)(1) of the Act, 21 U.S.C.section 360bbb-3(b)(1), unless the authorization is terminated  or revoked sooner.       Influenza A by PCR NEGATIVE NEGATIVE Final   Influenza B by PCR NEGATIVE NEGATIVE Final    Comment: (NOTE) The Xpert Xpress SARS-CoV-2/FLU/RSV plus assay is intended as an aid in the diagnosis of influenza from Nasopharyngeal swab specimens and should not be used as a sole basis for treatment. Nasal washings and aspirates are unacceptable for Xpert Xpress SARS-CoV-2/FLU/RSV testing.  Fact Sheet for Patients: EntrepreneurPulse.com.au  Fact Sheet for Healthcare Providers: IncredibleEmployment.be  This test is not yet approved or cleared by the [place] FDA and has been authorized for detection and/or diagnosis of SARS-CoV-2 by FDA under an Emergency Use Authorization (EUA). This EUA will remain in effect (meaning this test can be used) for the duration of the COVID-19 declaration under Section 564(b)(1) of the Act, 21 U.S.C. section  360bbb-3(b)(1), unless the authorization is terminated or revoked.  Performed at [facility], [place] [address], [place], [place] [address]   Culture, blood (routine x 2)     Status: None (Preliminary result)   Collection Time: 08/09/21  9:53 PM   Specimen: BLOOD RIGHT ARM  Result Value Ref Range Status   Specimen Description   Final    BLOOD RIGHT ARM Performed at [facility] [address], [place], [place] [address]    Special Requests   Final    BOTTLES DRAWN AEROBIC ONLY Blood Culture adequate volume Performed at [facility] [address], [place], [place] [address]    Culture   Final    NO GROWTH < 12 HOURS Performed at [facility] [address] [place], [place] [address]    Report Status PENDING  Incomplete  Culture, blood (routine x 2)     Status: None (Preliminary result)   Collection Time: 08/09/21  9:53 PM   Specimen: BLOOD LEFT ARM  Result Value Ref Range Status   Specimen Description   Final    BLOOD LEFT ARM Performed at [facility] [address] [place], [place] [address]    Special Requests   Final    BOTTLES DRAWN AEROBIC ONLY Blood Culture adequate volume Performed at [facility] [address], [place], [place] [address]    Culture   Final    NO GROWTH < 12 HOURS Performed at [facility] [address], [place],  [address]    Report Status PENDING  Incomplete  MRSA Next Gen by PCR, Nasal     Status: None   Collection Time: 08/09/21 10:25 PM   Specimen: Nasal Mucosa; Nasal Swab  Result Value Ref Range Status   MRSA by PCR Next Gen NOT DETECTED NOT DETECTED Final    Comment: (NOTE) The GeneXpert MRSA Assay (FDA approved for NASAL specimens only), is one component of a comprehensive MRSA colonization surveillance program. It is not intended to diagnose MRSA  infection nor to guide or monitor treatment for MRSA  infections. Test performance is not FDA approved in patients less than 23 years old. Performed at [facility], [place] [address], [place], [place] [address]     Procedures and diagnostic studies:  CT HEAD WO CONTRAST (5MM)  Result Date: 08/09/2021 CLINICAL DATA:  Head trauma, minor (Age >= 65y); Neck trauma (Age >= 65y) EXAM: CT HEAD WITHOUT CONTRAST CT CERVICAL SPINE WITHOUT CONTRAST TECHNIQUE: Multidetector CT imaging of the head and cervical spine was performed following the standard protocol without intravenous contrast. Multiplanar CT image reconstructions of the cervical spine were also generated. COMPARISON:  CT cervical spine July 08, 2017. CT head March 07, 2020. FINDINGS: CT HEAD FINDINGS Brain: Similar 15 mm extra-axial mass, just posterior to the porous acoustic is of the left internal auditory canal along the petrous ridge. No significant mass effect. No evidence of acute large vascular territory infarct, acute hemorrhage, midline shift. Similar age related atrophy and chronic microvascular ischemic disease. Vascular: No hyperdense vessel identified. Calcific intracranial atherosclerosis. Skull: No acute fracture. Chronic severe right TMJ degenerative change. Sinuses/Orbits: Clear visualized sinuses. Other: No mastoid effusions. CT CERVICAL SPINE FINDINGS Alignment: Similar alignment. Similar 5 mm of anterolisthesis of C7 on T1 and trace anterolisthesis of C3 on C4. Skull base and vertebrae: No evidence of acute fracture. Soft tissues and spinal canal: No prevertebral fluid or swelling. No visible canal hematoma. Disc levels: Similar severe craniocervical degenerative change. Additionally, severe multilevel degenerative disease and facet arthropathy Upper chest: Biapical pleuroparenchymal scarring. Other: Carotid bifurcation atherosclerosis. IMPRESSION: CT head: 1. No evidence of acute intracranial abnormality. 2. Similar putative 15 mm meningioma just posterior to the left  internal auditory canal. 3. Similar age related atrophy and chronic microvascular ischemic disease. CT cervical spine: 1. No evidence of acute fracture or traumatic malalignment. 2. Similar anterior C1 defect, discussed on 2018 prior. 3. Severe multilevel degenerative change. Electronically Signed   By: Margaretha Sheffield M.D.   On: 08/09/2021 17:47   CT Cervical Spine Wo Contrast  Result Date: 08/09/2021 CLINICAL DATA:  Head trauma, minor (Age >= 65y); Neck trauma (Age >= 65y) EXAM: CT HEAD WITHOUT CONTRAST CT CERVICAL SPINE WITHOUT CONTRAST TECHNIQUE: Multidetector CT imaging of the head and cervical spine was performed following the standard protocol without intravenous contrast. Multiplanar CT image reconstructions of the cervical spine were also generated. COMPARISON:  CT cervical spine July 08, 2017. CT head March 07, 2020. FINDINGS: CT HEAD FINDINGS Brain: Similar 15 mm extra-axial mass, just posterior to the porous acoustic is of the left internal auditory canal along the petrous ridge. No significant mass effect. No evidence of acute large vascular territory infarct, acute hemorrhage, midline shift. Similar age related atrophy and chronic microvascular ischemic disease. Vascular: No hyperdense vessel identified. Calcific intracranial atherosclerosis. Skull: No acute fracture. Chronic severe right TMJ degenerative change. Sinuses/Orbits: Clear visualized sinuses. Other: No mastoid effusions. CT CERVICAL SPINE FINDINGS Alignment: Similar alignment. Similar 5 mm of anterolisthesis of C7 on T1 and trace anterolisthesis of C3 on C4. Skull base and vertebrae: No evidence of acute fracture. Soft tissues and spinal canal: No prevertebral fluid or swelling. No visible canal hematoma. Disc levels: Similar severe craniocervical degenerative change. Additionally, severe multilevel degenerative disease and facet arthropathy Upper chest: Biapical pleuroparenchymal scarring. Other: Carotid bifurcation atherosclerosis.  IMPRESSION: CT head: 1. No evidence of acute intracranial abnormality. 2. Similar putative 15 mm meningioma just posterior to the left internal auditory canal. 3. Similar age related  atrophy and chronic microvascular ischemic disease. CT cervical spine: 1. No evidence of acute fracture or traumatic malalignment. 2. Similar anterior C1 defect, discussed on 2018 prior. 3. Severe multilevel degenerative change. Electronically Signed   By: Margaretha Sheffield M.D.   On: 08/09/2021 17:47   DG Pelvis Portable  Result Date: 08/09/2021 CLINICAL DATA:  Altered mental status, status post fall. EXAM: PORTABLE PELVIS 1-2 VIEWS COMPARISON:  January 24, 2014 FINDINGS: There is no evidence of an acute pelvic fracture or diastasis. No pelvic bone lesions are seen. A total right hip replacement is seen without evidence of surrounding lucency to suggest the presence of hardware loosening or infection. A radiopaque fixation plate and compression screw device are seen within the proximal left femur. IMPRESSION: 1. No acute findings. 2. Right hip replacement and proximal left femoral fixation hardware without evidence of hardware complication. Electronically Signed   By: Virgina Norfolk M.D.   On: 08/09/2021 17:53   DG Chest Port 1 View  Result Date: 08/09/2021 CLINICAL DATA:  Altered mental status. EXAM: PORTABLE CHEST 1 VIEW COMPARISON:  January 02, 2019 FINDINGS: There is no evidence of acute infiltrate, pleural effusion or pneumothorax. Mild, stable left basilar atelectasis is seen the heart size and mediastinal contours are within normal limits. A chronic seventh right rib fracture is noted. Prior vertebroplasty is seen at the level of L1 vertebral body. IMPRESSION: Predominantly stable exam without acute or active cardiopulmonary disease. Electronically Signed   By: Virgina Norfolk M.D.   On: 08/09/2021 17:47               LOS: 1 day   Oluwatimilehin Balfour  [facility]   Pager on [email or web address]. If 7PM-7AM,  please contact night-coverage at [email or web address]     08/10/2021, 12:35 PM

## 2021-08-11 LAB — URINE CULTURE: Culture: >=100000 — AB

## 2021-08-11 MED ORDER — CEFDINIR 300 MG PO CAPS: 300.0000 mg | ORAL_CAPSULE | Freq: Every day | ORAL | 0 refills | Status: AC

## 2021-08-11 MED ORDER — DOCUSATE SODIUM 100 MG PO CAPS: 100.0000 mg | ORAL_CAPSULE | Freq: Every day | ORAL | 2 refills | Status: AC

## 2021-08-11 MED ORDER — CEFDINIR 300 MG PO CAPS
300.0000 mg | ORAL_CAPSULE | Freq: Every day | ORAL | Status: DC
  Administered 2021-08-11: 300 mg via ORAL
  Filled 2021-08-11: qty 1

## 2021-08-11 MED ORDER — ENSURE ENLIVE PO LIQD: 237.0000 mL | Freq: Two times a day (BID) | ORAL | 0 refills | Status: AC

## 2021-08-11 NOTE — Progress Notes (Signed)
Report called to [employer] who acknowledged that she would be receiving the patient.

## 2021-08-11 NOTE — Discharge Summary (Addendum)
Physician Discharge Summary   Patient name: [name]  Admit date:     08/09/2021  Discharge date: 08/11/2021  Discharge Physician: Berle Mull   PCP: Gaynelle Arabian, MD   Recommendations at discharge: follow up with PCP in 1 week  Discharge Diagnoses Principal Problem:   Acute metabolic encephalopathy Active Problems:   Acute lower UTI   Hypokalemia, inadequate intake   Essential hypertension   Meningioma ([place])   Resolved Diagnoses Resolved Problems:   * No resolved hospital problems. [facility] Course   * Acute metabolic encephalopathy Patient is exhibiting progressive confusion with bouts of agitation, a marked change from baseline mentation and activity This is a deviation from patient's baseline according to the daughter and staff at the patient's nursing facility Patient is likely suffering from acute metabolic encephalopathy due to UTI CT head unremarkable. Normal vitamin B12, folate, ammonia, TSH.   Back to baseline after getting treatment for UTI  Acute lower UTI Due to E coli. Treated with ceftriaxone will be on omnicef, renally adjusted.  Need to ensure that the pt doesn't get constipated and gets her diapers changed frequently at facility. Will need to ensure hydration as well.   Hypokalemia, inadequate intake Hypokalemia secondary to poor oral intake REPLACED   Essential hypertension Blood pressure stable. Resume home meds.    Meningioma [facility]) Incidental finding on CT imaging, unchanged from CT head done in 2021.  Not clinically relevant to current condition Supportive care, outpatient follow-up  Body mass index is 17.22 kg/m.   Malnutrition Type: Nutrition Problem: Inadequate oral intake Etiology: lethargy/confusion Nutrition Interventions: Interventions: Ensure Enlive (each supplement provides 350kcal and 20 grams of protein), MVI    Procedures performed: none   Condition at discharge: good  Exam General: Appear in mild  distress, no Rash; Oral Mucosa Clear, moist. no Abnormal Neck Mass Or lumps, Conjunctiva normal  Cardiovascular: S1 and S2 Present, no Murmur, Respiratory: good respiratory effort, Bilateral Air entry present and CTA, no Crackles, no wheezes Abdomen: Bowel Sound present, Soft and no tenderness Extremities: no Pedal edema Neurology: alert and oriented to self and family.  affect appropriate. no new focal deficit Gait not checked due to patient safety concerns   Disposition: Assisted living  Discharge time: greater than 30 minutes.  Follow-up Information     Gaynelle Arabian, MD. Schedule an appointment as soon as possible for a visit in 1 week(s).   Specialty: Family Medicine Contact information: [address] [place] [address] [phone]                 Allergies as of 08/11/2021       Reactions   Dicloxacillin Other (See Comments)   Unknown severe allergic reaction   Other Other (See Comments)   Unknown reaction to hazel nuts   Pork-derived Products Other (See Comments)   Pt does not eat pork   Septra [sulfamethoxazole-trimethoprim] Nausea And Vomiting        Medication List     STOP taking these medications    cephALEXin 250 MG capsule Commonly known as: KEFLEX   HYDROcodone-acetaminophen 5-325 MG tablet Commonly known as: NORCO/VICODIN       TAKE these medications    acetaminophen 650 MG CR tablet Commonly known as: TYLENOL Take 650 mg by mouth every 8 (eight) hours as needed for pain.   amLODipine 2.5 MG tablet Commonly known as: NORVASC Take 2.5 mg by mouth daily.   aspirin EC 81 MG tablet  Take 81 mg by mouth daily. Swallow whole.   Calcium Carbonate-Vitamin D 600-400 MG-UNIT tablet Take 1 tablet by mouth every evening.   cefdinir 300 MG capsule Commonly known as: OMNICEF Take 1 capsule (300 mg total) by mouth daily for 4 days.   docusate sodium 100 MG capsule Commonly known as: Colace Take 1 capsule (100 mg  total) by mouth daily. Hold if has diarrhea   feeding supplement Liqd Take 237 mLs by mouth 2 (two) times daily between meals.   multivitamin with minerals Tabs tablet Take 1 tablet by mouth daily.   ondansetron 4 MG tablet Commonly known as: ZOFRAN Take 1 tablet (4 mg total) by mouth every 8 (eight) hours as needed for nausea or vomiting.        CT HEAD WO CONTRAST (5MM)  Result Date: 08/09/2021 CLINICAL DATA:  Head trauma, minor (Age >= 65y); Neck trauma (Age >= 65y) EXAM: CT HEAD WITHOUT CONTRAST CT CERVICAL SPINE WITHOUT CONTRAST TECHNIQUE: Multidetector CT imaging of the head and cervical spine was performed following the standard protocol without intravenous contrast. Multiplanar CT image reconstructions of the cervical spine were also generated. COMPARISON:  CT cervical spine July 08, 2017. CT head March 07, 2020. FINDINGS: CT HEAD FINDINGS Brain: Similar 15 mm extra-axial mass, just posterior to the porous acoustic is of the left internal auditory canal along the petrous ridge. No significant mass effect. No evidence of acute large vascular territory infarct, acute hemorrhage, midline shift. Similar age related atrophy and chronic microvascular ischemic disease. Vascular: No hyperdense vessel identified. Calcific intracranial atherosclerosis. Skull: No acute fracture. Chronic severe right TMJ degenerative change. Sinuses/Orbits: Clear visualized sinuses. Other: No mastoid effusions. CT CERVICAL SPINE FINDINGS Alignment: Similar alignment. Similar 5 mm of anterolisthesis of C7 on T1 and trace anterolisthesis of C3 on C4. Skull base and vertebrae: No evidence of acute fracture. Soft tissues and spinal canal: No prevertebral fluid or swelling. No visible canal hematoma. Disc levels: Similar severe craniocervical degenerative change. Additionally, severe multilevel degenerative disease and facet arthropathy Upper chest: Biapical pleuroparenchymal scarring. Other: Carotid bifurcation  atherosclerosis. IMPRESSION: CT head: 1. No evidence of acute intracranial abnormality. 2. Similar putative 15 mm meningioma just posterior to the left internal auditory canal. 3. Similar age related atrophy and chronic microvascular ischemic disease. CT cervical spine: 1. No evidence of acute fracture or traumatic malalignment. 2. Similar anterior C1 defect, discussed on 2018 prior. 3. Severe multilevel degenerative change. Electronically Signed   By: Margaretha Sheffield M.D.   On: 08/09/2021 17:47   CT Cervical Spine Wo Contrast  Result Date: 08/09/2021 CLINICAL DATA:  Head trauma, minor (Age >= 65y); Neck trauma (Age >= 65y) EXAM: CT HEAD WITHOUT CONTRAST CT CERVICAL SPINE WITHOUT CONTRAST TECHNIQUE: Multidetector CT imaging of the head and cervical spine was performed following the standard protocol without intravenous contrast. Multiplanar CT image reconstructions of the cervical spine were also generated. COMPARISON:  CT cervical spine July 08, 2017. CT head March 07, 2020. FINDINGS: CT HEAD FINDINGS Brain: Similar 15 mm extra-axial mass, just posterior to the porous acoustic is of the left internal auditory canal along the petrous ridge. No significant mass effect. No evidence of acute large vascular territory infarct, acute hemorrhage, midline shift. Similar age related atrophy and chronic microvascular ischemic disease. Vascular: No hyperdense vessel identified. Calcific intracranial atherosclerosis. Skull: No acute fracture. Chronic severe right TMJ degenerative change. Sinuses/Orbits: Clear visualized sinuses. Other: No mastoid effusions. CT CERVICAL SPINE FINDINGS Alignment: Similar alignment. Similar 5 mm of  anterolisthesis of C7 on T1 and trace anterolisthesis of C3 on C4. Skull base and vertebrae: No evidence of acute fracture. Soft tissues and spinal canal: No prevertebral fluid or swelling. No visible canal hematoma. Disc levels: Similar severe craniocervical degenerative change. Additionally,  severe multilevel degenerative disease and facet arthropathy Upper chest: Biapical pleuroparenchymal scarring. Other: Carotid bifurcation atherosclerosis. IMPRESSION: CT head: 1. No evidence of acute intracranial abnormality. 2. Similar putative 15 mm meningioma just posterior to the left internal auditory canal. 3. Similar age related atrophy and chronic microvascular ischemic disease. CT cervical spine: 1. No evidence of acute fracture or traumatic malalignment. 2. Similar anterior C1 defect, discussed on 2018 prior. 3. Severe multilevel degenerative change. Electronically Signed   By: Margaretha Sheffield M.D.   On: 08/09/2021 17:47   DG Pelvis Portable  Result Date: 08/09/2021 CLINICAL DATA:  Altered mental status, status post fall. EXAM: PORTABLE PELVIS 1-2 VIEWS COMPARISON:  January 24, 2014 FINDINGS: There is no evidence of an acute pelvic fracture or diastasis. No pelvic bone lesions are seen. A total right hip replacement is seen without evidence of surrounding lucency to suggest the presence of hardware loosening or infection. A radiopaque fixation plate and compression screw device are seen within the proximal left femur. IMPRESSION: 1. No acute findings. 2. Right hip replacement and proximal left femoral fixation hardware without evidence of hardware complication. Electronically Signed   By: Virgina Norfolk M.D.   On: 08/09/2021 17:53   DG Chest Port 1 View  Result Date: 08/09/2021 CLINICAL DATA:  Altered mental status. EXAM: PORTABLE CHEST 1 VIEW COMPARISON:  January 02, 2019 FINDINGS: There is no evidence of acute infiltrate, pleural effusion or pneumothorax. Mild, stable left basilar atelectasis is seen the heart size and mediastinal contours are within normal limits. A chronic seventh right rib fracture is noted. Prior vertebroplasty is seen at the level of L1 vertebral body. IMPRESSION: Predominantly stable exam without acute or active cardiopulmonary disease. Electronically Signed   By: Virgina Norfolk M.D.   On: 08/09/2021 17:47   Results for orders placed or performed during the hospital encounter of 08/09/21  Urine Culture     Status: Abnormal   Collection Time: 08/09/21  4:23 PM   Specimen: Urine, Clean Catch  Result Value Ref Range Status   Specimen Description   Final    URINE, CLEAN CATCH Performed at [facility] [address], [place], [place] [address]    Special Requests   Final    NONE Performed at [facility], [place] [address] [place], [place] [address]    Culture >=100,000 COLONIES/mL ESCHERICHIA COLI (A)  Final   Report Status 08/11/2021 FINAL  Final   Organism ID, Bacteria ESCHERICHIA COLI (A)  Final      Susceptibility   Escherichia coli - MIC*    AMPICILLIN 4 SENSITIVE Sensitive     CEFAZOLIN <=4 SENSITIVE Sensitive     CEFEPIME <=0.12 SENSITIVE Sensitive     CEFTRIAXONE <=0.25 SENSITIVE Sensitive     CIPROFLOXACIN >=4 RESISTANT Resistant     GENTAMICIN <=1 SENSITIVE Sensitive     IMIPENEM <=0.25 SENSITIVE Sensitive     NITROFURANTOIN <=16 SENSITIVE Sensitive     TRIMETH/SULFA <=20 SENSITIVE Sensitive     AMPICILLIN/SULBACTAM <=2 SENSITIVE Sensitive     PIP/TAZO <=4 SENSITIVE Sensitive     * >=100,000 COLONIES/mL ESCHERICHIA COLI  Resp Panel by RT-PCR (Flu A&B, Covid) Nasopharyngeal Swab     Status: None   Collection  Time: 08/09/21  6:37 PM   Specimen: Nasopharyngeal Swab; Nasopharyngeal(NP) swabs in vial transport medium  Result Value Ref Range Status   SARS Coronavirus 2 by RT PCR NEGATIVE NEGATIVE Final    Comment: (NOTE) SARS-CoV-2 target nucleic acids are NOT DETECTED.  The SARS-CoV-2 RNA is generally detectable in upper respiratory specimens during the acute phase of infection. The lowest concentration of SARS-CoV-2 viral copies this assay can detect is 138 copies/mL. A negative result does not preclude SARS-Cov-2 infection and should not be used as the sole basis for treatment or other patient  management decisions. A negative result may occur with  improper specimen collection/handling, submission of specimen other than nasopharyngeal swab, presence of viral mutation(s) within the areas targeted by this assay, and inadequate number of viral copies(<138 copies/mL). A negative result must be combined with clinical observations, patient history, and epidemiological information. The expected result is Negative.  Fact Sheet for Patients:  EntrepreneurPulse.com.au  Fact Sheet for Healthcare Providers:  IncredibleEmployment.be  This test is no t yet approved or cleared by the [place] FDA and  has been authorized for detection and/or diagnosis of SARS-CoV-2 by FDA under an Emergency Use Authorization (EUA). This EUA will remain  in effect (meaning this test can be used) for the duration of the COVID-19 declaration under Section 564(b)(1) of the Act, 21 U.S.C.section 360bbb-3(b)(1), unless the authorization is terminated  or revoked sooner.       Influenza A by PCR NEGATIVE NEGATIVE Final   Influenza B by PCR NEGATIVE NEGATIVE Final    Comment: (NOTE) The Xpert Xpress SARS-CoV-2/FLU/RSV plus assay is intended as an aid in the diagnosis of influenza from Nasopharyngeal swab specimens and should not be used as a sole basis for treatment. Nasal washings and aspirates are unacceptable for Xpert Xpress SARS-CoV-2/FLU/RSV testing.  Fact Sheet for Patients: EntrepreneurPulse.com.au  Fact Sheet for Healthcare Providers: IncredibleEmployment.be  This test is not yet approved or cleared by the [place] FDA and has been authorized for detection and/or diagnosis of SARS-CoV-2 by FDA under an Emergency Use Authorization (EUA). This EUA will remain in effect (meaning this test can be used) for the duration of the COVID-19 declaration under Section 564(b)(1) of the Act, 21 U.S.C. section 360bbb-3(b)(1),  unless the authorization is terminated or revoked.  Performed at [facility], [place] [address] [place], [place] [address]   Culture, blood (routine x 2)     Status: None (Preliminary result)   Collection Time: 08/09/21  9:53 PM   Specimen: BLOOD RIGHT ARM  Result Value Ref Range Status   Specimen Description   Final    BLOOD RIGHT ARM Performed at [facility] [address] [place], [place] [address]    Special Requests   Final    BOTTLES DRAWN AEROBIC ONLY Blood Culture adequate volume Performed at [facility] [address], [place], [place] [address]    Culture   Final    NO GROWTH 2 DAYS Performed at [facility] [address] [place], [place] [address]    Report Status PENDING  Incomplete  Culture, blood (routine x 2)     Status: None (Preliminary result)   Collection Time: 08/09/21  9:53 PM   Specimen: BLOOD LEFT ARM  Result Value Ref Range Status   Specimen Description   Final    BLOOD LEFT ARM Performed at [facility] [address], [place], [place] [address]    Special Requests  Final    BOTTLES DRAWN AEROBIC ONLY Blood Culture adequate volume Performed at [facility] [address], [place], [place] [address]    Culture   Final    NO GROWTH 2 DAYS Performed at [facility] [address] [place], [place] [address]    Report Status PENDING  Incomplete  MRSA Next Gen by PCR, Nasal     Status: None   Collection Time: 08/09/21 10:25 PM   Specimen: Nasal Mucosa; Nasal Swab  Result Value Ref Range Status   MRSA by PCR Next Gen NOT DETECTED NOT DETECTED Final    Comment: (NOTE) The GeneXpert MRSA Assay (FDA approved for NASAL specimens only), is one component of a comprehensive MRSA colonization surveillance program. It is not intended to diagnose MRSA infection nor to guide or monitor treatment for MRSA infections. Test performance is not FDA  approved in patients less than 58 years old. Performed at [facility], [place] [address] [place], [place] [address]     Signed:  Berle Mull MD.  [facility] 08/11/2021, 12:23 PM

## 2021-08-11 NOTE — Assessment & Plan Note (Signed)
Due to E coli. Treated with ceftriaxone will be on omnicef, renally adjusted.  Need to ensure that the pt doesn't get constipated and gets her diapers changed frequently at facility. Will need to ensure hydration as well.

## 2021-08-11 NOTE — Evaluation (Signed)
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  Past medical history of L superior pubic rami fracture in 2018, Cancer of the skin, basal cell (2008), Degenerative joint disease (DJD) of hip, Depression, Gait disorder, Hypertension, Hypokalemia, Iron deficiency anemia, Osteoporosis, and Pelvic fracture ([place]).  Clinical Impression  Pt admitted as above and presenting with functional mobility limitations 2* generalized weakness, ambulatory balance deficits and dementia related cognitive deficits.  This date pt very motivated and up to ambulate in hall with noted unusual gait and posterior drift placing pt at increased risk of additional falls.  Pt's dtr is present and states that pt appears weaker but is otherwise close to baseline and that pt has had this gait for many years and is related to "neurological problems".  Pt is currently resident of [facility] living but has additional assistance.  Dtr states assistance level can be increased to be with pt for OOB activities.  Pt would also benefit from return to previous living arrangement with enhanced support and follow up PT to maximize IND and safety.     Recommendations for follow up therapy are one component of a multi-disciplinary discharge planning process, led by the attending physician.  Recommendations may be updated based on patient status, additional functional criteria and insurance authorization.  Follow Up Recommendations Other (comment) (Pt has been on caseload at [facility] per Dtr)    Assistance Recommended at Discharge Frequent or constant Supervision/Assistance  Functional Status Assessment Patient has had a recent decline in their functional status and  demonstrates the ability to make significant improvements in function in a reasonable and predictable amount of time.  Equipment Recommendations  None recommended by PT    Recommendations for Other Services       Precautions / Restrictions Precautions Precautions: Fall Precaution Comments: Pt falls posteriorly and fall frequently at her ALF.  Pt is deaf and able to read instructions well. Restrictions Weight Bearing Restrictions: No      Mobility  Bed Mobility Overal bed mobility: Needs Assistance Bed Mobility: Supine to Sit     Supine to sit: Supervision     General bed mobility comments: Increased time but no physical assist to move to EOB sitting    Transfers Overall transfer level: Needs assistance Equipment used: Rolling walker (2 wheels) Transfers: Sit to/from Stand Sit to Stand: Min guard;Min assist           General transfer comment: Steady assist to prevent fall backward with standing    Ambulation/Gait Ambulation/Gait assistance: Min assist;Min guard Gait Distance (Feet): 68 Feet Assistive device: Rolling walker (2 wheels) Gait Pattern/deviations: Step-to pattern;Decreased step length - right;Decreased step length - left;Shuffle;Wide base of support;Trunk flexed;Steppage Gait velocity: decr   General Gait Details: Posterior drift; unique gait with increased BOS and noted steppage type gait - more exagerated on R.  Dtr states this has been her norm for many years 2* long running neurological issues.  Stairs            Wheelchair Mobility    Modified Rankin (Stroke Patients Only)       Balance Overall balance assessment: Needs assistance Sitting-balance support: No upper extremity supported;Feet supported Sitting balance-Leahy Scale: Good     Standing balance support: Bilateral upper  extremity supported Standing balance-Leahy Scale: Poor Standing balance comment: Posterior LOB, frequent falls, requires BUE support on RW.                              Pertinent Vitals/Pain Pain Assessment: Faces Faces Pain Scale: No hurt Pain Intervention(s): Monitored during session    Home Living Family/patient expects to be discharged to:: Other (Comment)                   Additional Comments: Per Dtr, pt lives at [facility] living but with assistance    Prior Function Prior Level of Function : Needs assist;History of Falls (last six months)  Cognitive Assist : ADLs (cognitive)   ADLs (Cognitive): Set up cues Physical Assist : Mobility (physical);ADLs (physical) Mobility (physical): Gait;Transfers ADLs (physical): Bathing;Dressing;Toileting;IADLs Mobility Comments: Pt ambulates with her 2 wheeled RW at all times but falls posteriorly fairly frequently with past fractures per medical history. Pt is active with PT "Belenda Cruise" per pt report and likes to exrecise. Pt needs Sup to Min Assist with ambulation at baseline and can usually walk down long hallways but with increased time.  Dtr states "we start to lunch 30 minutes early to get there in time"       Hand Dominance        Extremity/Trunk Assessment   Upper Extremity Assessment Upper Extremity Assessment: Defer to OT evaluation    Lower Extremity Assessment Lower Extremity Assessment: Generalized weakness;Difficult to assess due to impaired cognition    Cervical / Trunk Assessment Cervical / Trunk Assessment: Kyphotic  Communication   Communication: HOH  Cognition Arousal/Alertness: Awake/alert Behavior During Therapy: WFL for tasks assessed/performed;Impulsive Overall Cognitive Status: History of cognitive impairments - at baseline                                 General Comments: Per family, pt seems to be returning to baseline mentation but now appears weaker than usual.        General Comments      Exercises     Assessment/Plan    PT Assessment Patient needs continued PT services  PT Problem List Decreased  strength;Decreased activity tolerance;Decreased balance;Decreased mobility;Decreased cognition;Decreased knowledge of use of DME;Decreased safety awareness       PT Treatment Interventions DME instruction;Gait training;Functional mobility training;Therapeutic activities;Therapeutic exercise;Balance training;Cognitive remediation;Patient/family education    PT Goals (Current goals can be found in the Care Plan section)  Acute Rehab PT Goals Patient Stated Goal: Regain IND and go home PT Goal Formulation: With patient Time For Goal Achievement: 08/25/21 Potential to Achieve Goals: Fair    Frequency Min 3X/week   Barriers to discharge        Co-evaluation               AM-PAC PT "6 Clicks" Mobility  Outcome Measure Help needed turning from your back to your side while in a flat bed without using bedrails?: None Help needed moving from lying on your back to sitting on the side of a flat bed without using bedrails?: A Little Help needed moving to and from a bed to a chair (including a wheelchair)?: A Little Help needed standing up from a chair using your arms (e.g., wheelchair or bedside chair)?: A Little Help needed to walk in hospital room?: A Little Help needed climbing 3-5 steps with a railing? : A Lot  6 Click Score: 18    End of Session Equipment Utilized During Treatment: Gait belt Activity Tolerance: Patient tolerated treatment well;Patient limited by fatigue Patient left: in chair;with call bell/phone within reach;with chair alarm set;with family/visitor present Nurse Communication: Mobility status PT Visit Diagnosis: History of falling (Z91.81);Difficulty in walking, not elsewhere classified (R26.2);Muscle weakness (generalized) (M62.81);Other abnormalities of gait and mobility (R26.89)    Time: 9432-0037 PT Time Calculation (min) (ACUTE ONLY): 30 min   Charges:   PT Evaluation $PT Eval Low Complexity: 1 Low PT Treatments $Gait Training: 8-22 mins         Debe Coder PT Acute Rehabilitation Services Pager [phone] Office [phone]   Lileigh Fahringer 08/11/2021, 12:44 PM

## 2021-08-11 NOTE — TOC Progression Note (Signed)
Transition of Care [facility]) - Progression Note    Patient Details  Name: [name] MRN: [id number] Date of Birth: 05-14-1919  Transition of Care [facility]) CM/SW Contact  Ventura Hollenbeck, Marjie Skiff, RN Phone Number: 08/11/2021, 12:31 PM  Clinical Narrative:     Pt to dc back to [facility] today via daughter's private vehicle. DC summary faxed to [facility]. HHPT order faxed to [facility]. RN to call report to [phone].  Expected Discharge Plan:  ([facility]) Barriers to Discharge: No Barriers Identified  Expected Discharge Plan and Services Expected Discharge Plan:  ([facility])         Expected Discharge Date: 08/11/21                     Social Determinants of Health (SDOH) Interventions    Readmission Risk Interventions No flowsheet data found.

## 2021-08-12 DIAGNOSIS — Z20828 Contact with and (suspected) exposure to other viral communicable diseases: Secondary | ICD-10-CM | POA: Diagnosis not present

## 2021-08-13 DIAGNOSIS — Z20828 Contact with and (suspected) exposure to other viral communicable diseases: Secondary | ICD-10-CM | POA: Diagnosis not present

## 2021-08-14 DIAGNOSIS — R262 Difficulty in walking, not elsewhere classified: Secondary | ICD-10-CM | POA: Diagnosis not present

## 2021-08-14 DIAGNOSIS — R296 Repeated falls: Secondary | ICD-10-CM | POA: Diagnosis not present

## 2021-08-14 DIAGNOSIS — R278 Other lack of coordination: Secondary | ICD-10-CM | POA: Diagnosis not present

## 2021-08-14 DIAGNOSIS — R2681 Unsteadiness on feet: Secondary | ICD-10-CM | POA: Diagnosis not present

## 2021-08-14 LAB — CULTURE, BLOOD (ROUTINE X 2)
Culture: NO GROWTH
Culture: NO GROWTH
Special Requests: ADEQUATE
Special Requests: ADEQUATE

## 2021-08-16 DIAGNOSIS — R262 Difficulty in walking, not elsewhere classified: Secondary | ICD-10-CM | POA: Diagnosis not present

## 2021-08-16 DIAGNOSIS — R2681 Unsteadiness on feet: Secondary | ICD-10-CM | POA: Diagnosis not present

## 2021-08-16 DIAGNOSIS — R278 Other lack of coordination: Secondary | ICD-10-CM | POA: Diagnosis not present

## 2021-08-16 DIAGNOSIS — R296 Repeated falls: Secondary | ICD-10-CM | POA: Diagnosis not present

## 2021-08-17 DIAGNOSIS — R296 Repeated falls: Secondary | ICD-10-CM | POA: Diagnosis not present

## 2021-08-17 DIAGNOSIS — R262 Difficulty in walking, not elsewhere classified: Secondary | ICD-10-CM | POA: Diagnosis not present

## 2021-08-17 DIAGNOSIS — R2681 Unsteadiness on feet: Secondary | ICD-10-CM | POA: Diagnosis not present

## 2021-08-17 DIAGNOSIS — R278 Other lack of coordination: Secondary | ICD-10-CM | POA: Diagnosis not present

## 2021-08-20 DIAGNOSIS — Z20828 Contact with and (suspected) exposure to other viral communicable diseases: Secondary | ICD-10-CM | POA: Diagnosis not present

## 2021-08-20 DIAGNOSIS — N390 Urinary tract infection, site not specified: Secondary | ICD-10-CM | POA: Diagnosis not present

## 2021-08-22 DIAGNOSIS — R278 Other lack of coordination: Secondary | ICD-10-CM | POA: Diagnosis not present

## 2021-08-22 DIAGNOSIS — R2681 Unsteadiness on feet: Secondary | ICD-10-CM | POA: Diagnosis not present

## 2021-08-22 DIAGNOSIS — R296 Repeated falls: Secondary | ICD-10-CM | POA: Diagnosis not present

## 2021-08-22 DIAGNOSIS — R262 Difficulty in walking, not elsewhere classified: Secondary | ICD-10-CM | POA: Diagnosis not present

## 2021-08-23 DIAGNOSIS — R278 Other lack of coordination: Secondary | ICD-10-CM | POA: Diagnosis not present

## 2021-08-23 DIAGNOSIS — R296 Repeated falls: Secondary | ICD-10-CM | POA: Diagnosis not present

## 2021-08-23 DIAGNOSIS — R2681 Unsteadiness on feet: Secondary | ICD-10-CM | POA: Diagnosis not present

## 2021-08-23 DIAGNOSIS — R262 Difficulty in walking, not elsewhere classified: Secondary | ICD-10-CM | POA: Diagnosis not present

## 2021-08-24 DIAGNOSIS — R2681 Unsteadiness on feet: Secondary | ICD-10-CM | POA: Diagnosis not present

## 2021-08-24 DIAGNOSIS — R262 Difficulty in walking, not elsewhere classified: Secondary | ICD-10-CM | POA: Diagnosis not present

## 2021-08-24 DIAGNOSIS — R296 Repeated falls: Secondary | ICD-10-CM | POA: Diagnosis not present

## 2021-08-24 DIAGNOSIS — R278 Other lack of coordination: Secondary | ICD-10-CM | POA: Diagnosis not present

## 2021-08-27 DIAGNOSIS — R262 Difficulty in walking, not elsewhere classified: Secondary | ICD-10-CM | POA: Diagnosis not present

## 2021-08-27 DIAGNOSIS — Z20822 Contact with and (suspected) exposure to covid-19: Secondary | ICD-10-CM | POA: Diagnosis not present

## 2021-08-27 DIAGNOSIS — R296 Repeated falls: Secondary | ICD-10-CM | POA: Diagnosis not present

## 2021-08-27 DIAGNOSIS — R278 Other lack of coordination: Secondary | ICD-10-CM | POA: Diagnosis not present

## 2021-08-27 DIAGNOSIS — R2681 Unsteadiness on feet: Secondary | ICD-10-CM | POA: Diagnosis not present

## 2021-08-29 DIAGNOSIS — R296 Repeated falls: Secondary | ICD-10-CM | POA: Diagnosis not present

## 2021-08-29 DIAGNOSIS — R2681 Unsteadiness on feet: Secondary | ICD-10-CM | POA: Diagnosis not present

## 2021-08-29 DIAGNOSIS — R278 Other lack of coordination: Secondary | ICD-10-CM | POA: Diagnosis not present

## 2021-08-29 DIAGNOSIS — R262 Difficulty in walking, not elsewhere classified: Secondary | ICD-10-CM | POA: Diagnosis not present

## 2021-08-31 DIAGNOSIS — R2681 Unsteadiness on feet: Secondary | ICD-10-CM | POA: Diagnosis not present

## 2021-08-31 DIAGNOSIS — R262 Difficulty in walking, not elsewhere classified: Secondary | ICD-10-CM | POA: Diagnosis not present

## 2021-08-31 DIAGNOSIS — R278 Other lack of coordination: Secondary | ICD-10-CM | POA: Diagnosis not present

## 2021-08-31 DIAGNOSIS — R296 Repeated falls: Secondary | ICD-10-CM | POA: Diagnosis not present

## 2021-09-03 DIAGNOSIS — Z1159 Encounter for screening for other viral diseases: Secondary | ICD-10-CM | POA: Diagnosis not present

## 2021-09-03 DIAGNOSIS — Z20828 Contact with and (suspected) exposure to other viral communicable diseases: Secondary | ICD-10-CM | POA: Diagnosis not present

## 2021-09-03 DIAGNOSIS — R2681 Unsteadiness on feet: Secondary | ICD-10-CM | POA: Diagnosis not present

## 2021-09-03 DIAGNOSIS — R296 Repeated falls: Secondary | ICD-10-CM | POA: Diagnosis not present

## 2021-09-03 DIAGNOSIS — R278 Other lack of coordination: Secondary | ICD-10-CM | POA: Diagnosis not present

## 2021-09-03 DIAGNOSIS — R262 Difficulty in walking, not elsewhere classified: Secondary | ICD-10-CM | POA: Diagnosis not present

## 2021-09-05 DIAGNOSIS — R296 Repeated falls: Secondary | ICD-10-CM | POA: Diagnosis not present

## 2021-09-05 DIAGNOSIS — R2681 Unsteadiness on feet: Secondary | ICD-10-CM | POA: Diagnosis not present

## 2021-09-05 DIAGNOSIS — R262 Difficulty in walking, not elsewhere classified: Secondary | ICD-10-CM | POA: Diagnosis not present

## 2021-09-05 DIAGNOSIS — R278 Other lack of coordination: Secondary | ICD-10-CM | POA: Diagnosis not present

## 2021-09-07 DIAGNOSIS — R262 Difficulty in walking, not elsewhere classified: Secondary | ICD-10-CM | POA: Diagnosis not present

## 2021-09-07 DIAGNOSIS — R296 Repeated falls: Secondary | ICD-10-CM | POA: Diagnosis not present

## 2021-09-07 DIAGNOSIS — R278 Other lack of coordination: Secondary | ICD-10-CM | POA: Diagnosis not present

## 2021-09-07 DIAGNOSIS — R2681 Unsteadiness on feet: Secondary | ICD-10-CM | POA: Diagnosis not present

## 2021-09-10 DIAGNOSIS — R278 Other lack of coordination: Secondary | ICD-10-CM | POA: Diagnosis not present

## 2021-09-10 DIAGNOSIS — R2681 Unsteadiness on feet: Secondary | ICD-10-CM | POA: Diagnosis not present

## 2021-09-10 DIAGNOSIS — R296 Repeated falls: Secondary | ICD-10-CM | POA: Diagnosis not present

## 2021-09-10 DIAGNOSIS — R262 Difficulty in walking, not elsewhere classified: Secondary | ICD-10-CM | POA: Diagnosis not present

## 2021-09-12 DIAGNOSIS — R262 Difficulty in walking, not elsewhere classified: Secondary | ICD-10-CM | POA: Diagnosis not present

## 2021-09-12 DIAGNOSIS — R296 Repeated falls: Secondary | ICD-10-CM | POA: Diagnosis not present

## 2021-09-12 DIAGNOSIS — R278 Other lack of coordination: Secondary | ICD-10-CM | POA: Diagnosis not present

## 2021-09-12 DIAGNOSIS — R2681 Unsteadiness on feet: Secondary | ICD-10-CM | POA: Diagnosis not present

## 2021-09-14 DIAGNOSIS — R278 Other lack of coordination: Secondary | ICD-10-CM | POA: Diagnosis not present

## 2021-09-14 DIAGNOSIS — R2681 Unsteadiness on feet: Secondary | ICD-10-CM | POA: Diagnosis not present

## 2021-09-14 DIAGNOSIS — R296 Repeated falls: Secondary | ICD-10-CM | POA: Diagnosis not present

## 2021-09-14 DIAGNOSIS — R262 Difficulty in walking, not elsewhere classified: Secondary | ICD-10-CM | POA: Diagnosis not present

## 2021-09-17 DIAGNOSIS — R262 Difficulty in walking, not elsewhere classified: Secondary | ICD-10-CM | POA: Diagnosis not present

## 2021-09-17 DIAGNOSIS — Z20828 Contact with and (suspected) exposure to other viral communicable diseases: Secondary | ICD-10-CM | POA: Diagnosis not present

## 2021-09-17 DIAGNOSIS — Z1159 Encounter for screening for other viral diseases: Secondary | ICD-10-CM | POA: Diagnosis not present

## 2021-09-17 DIAGNOSIS — R296 Repeated falls: Secondary | ICD-10-CM | POA: Diagnosis not present

## 2021-09-17 DIAGNOSIS — R2681 Unsteadiness on feet: Secondary | ICD-10-CM | POA: Diagnosis not present

## 2021-09-17 DIAGNOSIS — R278 Other lack of coordination: Secondary | ICD-10-CM | POA: Diagnosis not present

## 2021-09-19 DIAGNOSIS — Z1159 Encounter for screening for other viral diseases: Secondary | ICD-10-CM | POA: Diagnosis not present

## 2021-09-19 DIAGNOSIS — R262 Difficulty in walking, not elsewhere classified: Secondary | ICD-10-CM | POA: Diagnosis not present

## 2021-09-19 DIAGNOSIS — Z20828 Contact with and (suspected) exposure to other viral communicable diseases: Secondary | ICD-10-CM | POA: Diagnosis not present

## 2021-09-19 DIAGNOSIS — R278 Other lack of coordination: Secondary | ICD-10-CM | POA: Diagnosis not present

## 2021-09-19 DIAGNOSIS — R296 Repeated falls: Secondary | ICD-10-CM | POA: Diagnosis not present

## 2021-09-19 DIAGNOSIS — R2681 Unsteadiness on feet: Secondary | ICD-10-CM | POA: Diagnosis not present

## 2021-09-24 DIAGNOSIS — R2681 Unsteadiness on feet: Secondary | ICD-10-CM | POA: Diagnosis not present

## 2021-09-24 DIAGNOSIS — Z1159 Encounter for screening for other viral diseases: Secondary | ICD-10-CM | POA: Diagnosis not present

## 2021-09-24 DIAGNOSIS — R296 Repeated falls: Secondary | ICD-10-CM | POA: Diagnosis not present

## 2021-09-24 DIAGNOSIS — R278 Other lack of coordination: Secondary | ICD-10-CM | POA: Diagnosis not present

## 2021-09-24 DIAGNOSIS — Z20828 Contact with and (suspected) exposure to other viral communicable diseases: Secondary | ICD-10-CM | POA: Diagnosis not present

## 2021-09-24 DIAGNOSIS — R262 Difficulty in walking, not elsewhere classified: Secondary | ICD-10-CM | POA: Diagnosis not present

## 2021-09-26 DIAGNOSIS — Z1159 Encounter for screening for other viral diseases: Secondary | ICD-10-CM | POA: Diagnosis not present

## 2021-09-26 DIAGNOSIS — Z20828 Contact with and (suspected) exposure to other viral communicable diseases: Secondary | ICD-10-CM | POA: Diagnosis not present

## 2021-09-28 DIAGNOSIS — R278 Other lack of coordination: Secondary | ICD-10-CM | POA: Diagnosis not present

## 2021-09-28 DIAGNOSIS — R2681 Unsteadiness on feet: Secondary | ICD-10-CM | POA: Diagnosis not present

## 2021-09-28 DIAGNOSIS — R296 Repeated falls: Secondary | ICD-10-CM | POA: Diagnosis not present

## 2021-09-28 DIAGNOSIS — R262 Difficulty in walking, not elsewhere classified: Secondary | ICD-10-CM | POA: Diagnosis not present

## 2021-10-01 DIAGNOSIS — Z20828 Contact with and (suspected) exposure to other viral communicable diseases: Secondary | ICD-10-CM | POA: Diagnosis not present

## 2021-10-01 DIAGNOSIS — Z1159 Encounter for screening for other viral diseases: Secondary | ICD-10-CM | POA: Diagnosis not present

## 2021-10-03 DIAGNOSIS — Z20828 Contact with and (suspected) exposure to other viral communicable diseases: Secondary | ICD-10-CM | POA: Diagnosis not present

## 2021-10-03 DIAGNOSIS — R278 Other lack of coordination: Secondary | ICD-10-CM | POA: Diagnosis not present

## 2021-10-03 DIAGNOSIS — R262 Difficulty in walking, not elsewhere classified: Secondary | ICD-10-CM | POA: Diagnosis not present

## 2021-10-03 DIAGNOSIS — R2681 Unsteadiness on feet: Secondary | ICD-10-CM | POA: Diagnosis not present

## 2021-10-03 DIAGNOSIS — Z1159 Encounter for screening for other viral diseases: Secondary | ICD-10-CM | POA: Diagnosis not present

## 2021-10-03 DIAGNOSIS — R296 Repeated falls: Secondary | ICD-10-CM | POA: Diagnosis not present

## 2021-10-04 DIAGNOSIS — R278 Other lack of coordination: Secondary | ICD-10-CM | POA: Diagnosis not present

## 2021-10-04 DIAGNOSIS — R296 Repeated falls: Secondary | ICD-10-CM | POA: Diagnosis not present

## 2021-10-04 DIAGNOSIS — R2681 Unsteadiness on feet: Secondary | ICD-10-CM | POA: Diagnosis not present

## 2021-10-04 DIAGNOSIS — R262 Difficulty in walking, not elsewhere classified: Secondary | ICD-10-CM | POA: Diagnosis not present

## 2021-10-10 DIAGNOSIS — R278 Other lack of coordination: Secondary | ICD-10-CM | POA: Diagnosis not present

## 2021-10-10 DIAGNOSIS — R2681 Unsteadiness on feet: Secondary | ICD-10-CM | POA: Diagnosis not present

## 2021-10-10 DIAGNOSIS — R262 Difficulty in walking, not elsewhere classified: Secondary | ICD-10-CM | POA: Diagnosis not present

## 2021-10-10 DIAGNOSIS — R296 Repeated falls: Secondary | ICD-10-CM | POA: Diagnosis not present

## 2021-10-11 DIAGNOSIS — Z87440 Personal history of urinary (tract) infections: Secondary | ICD-10-CM | POA: Diagnosis not present

## 2021-10-11 DIAGNOSIS — F0390 Unspecified dementia without behavioral disturbance: Secondary | ICD-10-CM | POA: Diagnosis not present

## 2021-10-11 DIAGNOSIS — K5901 Slow transit constipation: Secondary | ICD-10-CM | POA: Diagnosis not present

## 2021-10-11 DIAGNOSIS — I10 Essential (primary) hypertension: Secondary | ICD-10-CM | POA: Diagnosis not present

## 2021-10-12 DIAGNOSIS — R262 Difficulty in walking, not elsewhere classified: Secondary | ICD-10-CM | POA: Diagnosis not present

## 2021-10-12 DIAGNOSIS — R278 Other lack of coordination: Secondary | ICD-10-CM | POA: Diagnosis not present

## 2021-10-12 DIAGNOSIS — R2681 Unsteadiness on feet: Secondary | ICD-10-CM | POA: Diagnosis not present

## 2021-10-12 DIAGNOSIS — R296 Repeated falls: Secondary | ICD-10-CM | POA: Diagnosis not present

## 2021-10-15 DIAGNOSIS — Z20828 Contact with and (suspected) exposure to other viral communicable diseases: Secondary | ICD-10-CM | POA: Diagnosis not present

## 2021-10-16 DIAGNOSIS — R262 Difficulty in walking, not elsewhere classified: Secondary | ICD-10-CM | POA: Diagnosis not present

## 2021-10-16 DIAGNOSIS — R296 Repeated falls: Secondary | ICD-10-CM | POA: Diagnosis not present

## 2021-10-16 DIAGNOSIS — R2681 Unsteadiness on feet: Secondary | ICD-10-CM | POA: Diagnosis not present

## 2021-10-16 DIAGNOSIS — R278 Other lack of coordination: Secondary | ICD-10-CM | POA: Diagnosis not present

## 2021-10-17 DIAGNOSIS — Z1159 Encounter for screening for other viral diseases: Secondary | ICD-10-CM | POA: Diagnosis not present

## 2021-10-17 DIAGNOSIS — Z20828 Contact with and (suspected) exposure to other viral communicable diseases: Secondary | ICD-10-CM | POA: Diagnosis not present

## 2021-10-18 DIAGNOSIS — F0390 Unspecified dementia without behavioral disturbance: Secondary | ICD-10-CM | POA: Diagnosis not present

## 2021-10-18 DIAGNOSIS — L98411 Non-pressure chronic ulcer of buttock limited to breakdown of skin: Secondary | ICD-10-CM | POA: Diagnosis not present

## 2021-10-18 DIAGNOSIS — I10 Essential (primary) hypertension: Secondary | ICD-10-CM | POA: Diagnosis not present

## 2021-10-19 ENCOUNTER — Telehealth: Payer: Self-pay

## 2021-10-19 DIAGNOSIS — R2681 Unsteadiness on feet: Secondary | ICD-10-CM | POA: Diagnosis not present

## 2021-10-19 DIAGNOSIS — R278 Other lack of coordination: Secondary | ICD-10-CM | POA: Diagnosis not present

## 2021-10-19 DIAGNOSIS — R262 Difficulty in walking, not elsewhere classified: Secondary | ICD-10-CM | POA: Diagnosis not present

## 2021-10-19 DIAGNOSIS — R296 Repeated falls: Secondary | ICD-10-CM | POA: Diagnosis not present

## 2021-10-19 NOTE — Telephone Encounter (Signed)
Spoke with patient's daughter [name] and scheduled an in-person Palliative Consult for 10/29/21 @ 1:30PM.   COVID screening was negative. No pets in home. Patient lives alone.  Consent obtained; updated Outlook/Netsmart/Team List and Epic.   Family is aware they may be receiving a call from provider the day before or day of to confirm appointment.

## 2021-10-25 DIAGNOSIS — D329 Benign neoplasm of meninges, unspecified: Secondary | ICD-10-CM | POA: Diagnosis not present

## 2021-10-25 DIAGNOSIS — R159 Full incontinence of feces: Secondary | ICD-10-CM | POA: Diagnosis not present

## 2021-10-25 DIAGNOSIS — Z993 Dependence on wheelchair: Secondary | ICD-10-CM | POA: Diagnosis not present

## 2021-10-25 DIAGNOSIS — I10 Essential (primary) hypertension: Secondary | ICD-10-CM | POA: Diagnosis not present

## 2021-10-25 DIAGNOSIS — S31819D Unspecified open wound of right buttock, subsequent encounter: Secondary | ICD-10-CM | POA: Diagnosis not present

## 2021-10-25 DIAGNOSIS — R32 Unspecified urinary incontinence: Secondary | ICD-10-CM | POA: Diagnosis not present

## 2021-10-25 DIAGNOSIS — F0390 Unspecified dementia without behavioral disturbance: Secondary | ICD-10-CM | POA: Diagnosis not present

## 2021-10-25 DIAGNOSIS — Z87440 Personal history of urinary (tract) infections: Secondary | ICD-10-CM | POA: Diagnosis not present

## 2021-10-25 DIAGNOSIS — H9193 Unspecified hearing loss, bilateral: Secondary | ICD-10-CM | POA: Diagnosis not present

## 2021-10-25 DIAGNOSIS — K5900 Constipation, unspecified: Secondary | ICD-10-CM | POA: Diagnosis not present

## 2021-10-25 DIAGNOSIS — Z9181 History of falling: Secondary | ICD-10-CM | POA: Diagnosis not present

## 2021-10-29 ENCOUNTER — Other Ambulatory Visit: Payer: Self-pay

## 2021-10-29 ENCOUNTER — Other Ambulatory Visit: Payer: Medicare Other | Admitting: Hospice

## 2021-10-29 DIAGNOSIS — F0390 Unspecified dementia without behavioral disturbance: Secondary | ICD-10-CM

## 2021-10-29 DIAGNOSIS — R269 Unspecified abnormalities of gait and mobility: Secondary | ICD-10-CM

## 2021-10-29 DIAGNOSIS — I10 Essential (primary) hypertension: Secondary | ICD-10-CM | POA: Diagnosis not present

## 2021-10-29 DIAGNOSIS — Z515 Encounter for palliative care: Secondary | ICD-10-CM

## 2021-10-29 DIAGNOSIS — Z20828 Contact with and (suspected) exposure to other viral communicable diseases: Secondary | ICD-10-CM | POA: Diagnosis not present

## 2021-10-29 NOTE — Progress Notes (Signed)
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 Palliative Care was asked to follow this patient by consultation request of  Clide Deutscher, NP to address advance care planning, complex medical decision making and goals of care clarification. [name] and her spouse are present with patient during visit. This is the initial visit.  Patient is hard of hearing, uses a white board for communication.     ASSESSMENT AND / RECOMMENDATIONS:   Advance Care Planning: Our advance care planning conversation included a discussion about:    The value and importance of advance care planning  Difference between Hospice and Palliative care Exploration of goals of care in the event of a sudden injury or illness  Identification and preparation of a healthcare agent  Review and updating or creation of an  advance directive document . Decision not to resuscitate or to de-escalate disease focused treatments due to poor prognosis.  CODE STATUS: Patient is a Do Not Resuscitate.  Goals of Care: Goals include to maximize quality of life and symptom management. Family is interested in hospice care in the future when patient qualifies for it.   I spent  30 minutes providing this initial consultation. More than 50% of the time in this consultation was spent on counseling  patient and coordinating communication. --------------------------------------------------------------------------------------------------------------------------------------  Symptom Management/Plan: Gait Disturbance: with repeated falls several times a day in the past; currently non ambulatory. She is working with physical therapy with [facility]. Patient is a hight fall risk. Safety and Fall  precautions in place.  Dementia: Memory loss and confusion, incontinent of bowel and bladder, FAST 6D. Education provided on Dementia disease trajectory and what to expect. Impoverished thoughts, impaired judgement, word-finding difficulty. Encourage reminiscence, word search/puzzles, cueing for recollection.  Promote calm approach and engaging environment Routine CBC BMP HTN: Managed with Amlodipine. Follow up: Palliative care will continue to follow for complex medical decision making, advance care planning, and clarification of goals. Return 6 weeks or prn. Encouraged to call provider sooner with any concerns.   Family /Caregiver/Community Supports: Patient lives at home independently with assistance from [place] home. Home health services in place.   HOSPICE ELIGIBILITY/DIAGNOSIS: TBD  Chief Complaint: Initial Palliative care visit  HISTORY OF PRESENT ILLNESS:  [name] is a 88 y.o. year old female  with multiple medical conditions including gait disturbance, weak poor balance resulting in multiple falls which impairs her quality of life. Gait disturbance is worse when she tries to stand. Physical therapy is ongoing, patient not sure if it is helpful. She is currently non ambulatory but asks for a cane or rolling walker. Ambulation not safe at this time; patient continues to work with [facility] physical therapy. History of Tuberculosis - latent since 1953, HTN, Dementia without behavior.  History obtained from review of EMR, discussion with primary team, caregiver, family and/or Ms. Specht.  Review and summarization of Epic records shows history from other than patient. Rest of 10 point ROS asked and negative.  I reviewed as needed, available labs, patient records, imaging, studies and related documents from the EMR. Sodium 135 - 145 mmol/L 138  139  140  137   137  138   Potassium 3.5 - 5.1 mmol/L 3.5  3.4 Low   3.4 Low   3.6   3.8  3.4 Low    Chloride 98 - 111 mmol/L 103  103  103  106   107  104 R   CO2 22 - 32 mmol/L 22  25  25  22   23  26    Glucose, Bld 70 - 99 mg/dL 95  101 High  CM  113 High  CM  125 High    116 High   107 High  R   Comment: Glucose reference range applies only to samples taken after fasting for at least 8 hours.  BUN 8 - 23 mg/dL 14  19  24  High   19   19  10  R   Creatinine, Ser 0.44 - 1.00 mg/dL 0.48  0.74  1.12 High   0.93   0.95  0.83   Calcium 8.9 - 10.3 mg/dL 8.6 Low   9.2  9.7  8.9   9.3  8.5 Low    Total Protein 6.5 - 8.1 g/dL 7.0  7.9     6.8    Albumin 3.5 - 5.0 g/dL 3.2 Low   3.9    3.6 CM  3.6    AST 15 - 41 U/L 22  23     22     ALT 0 - 44 U/L 13  14     17     Alkaline Phosphatase 38 - 126 U/L 58  72     62    Total Bilirubin 0.3 - 1.2 mg/dL 1.3 High   1.5 High      0.7    GFR, Estimated >60 mL/min >60  >60 CM        Comment: (NOTE)  Calculated using the CKD-EPI Creatinine Equation (2021)   Anion gap 5 - 15 13  11  CM  12 CM  9 CM        ROS General: NAD EYES: denies vision changes ENMT: denies dysphagia Cardiovascular: denies chest pain/discomfort Pulmonary: denies cough, denies SOB Abdomen: endorses good appetite, denies constipation/diarrhea GU: denies dysuria, urinary frequency MSK:  endorses weakness,  recent falls with no injuries Skin: denies rashes or wounds Neurological: denies pain, denies insomnia Psych: Endorses positive mood Heme/lymph/immuno: denies bruises, abnormal bleeding  Physical Exam: Height/Weight: 4 feet 10 inches/102 Ibs Constitutional: NAD, hard of hearing, white board for writing for communication to  patient General: Well groomed, cooperative EYES: anicteric sclera, lids intact, no discharge  ENMT: Moist mucous membrane CV: S1 S2, RRR, no LE edema Pulmonary: LCTA, no increased work of breathing, no cough, Abdomen: active BS + 4 quadrants, soft and non tender GU: no suprapubic tenderness MSK: weakness, sarcopenia, non ambulatory in the last month. Skin: warm and dry, no rashes or wounds on visible skin Neuro:  weakness, otherwise non focal Psych: non-anxious affect Hem/lymph/immuno: no widespread bruising   PAST MEDICAL HISTORY:  Active Ambulatory Problems    Diagnosis Date Noted   Subdural hematoma 07/08/2017   Frequent falls 07/08/2017   Acute lower UTI 48/18/5631   Acute metabolic encephalopathy 49/70/2637   Fall    Hypokalemia, inadequate intake  07/09/2017   Leukocytosis 01/02/2019   Pubic ramus fracture ([place]) 01/02/2019   Hip pain 01/02/2019   Essential hypertension 01/02/2019   Normocytic anemia 01/04/2019   Meningioma ([place]) 08/09/2021   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/21/2019   Resolved Ambulatory Problems    Diagnosis Date Noted   No Resolved Ambulatory Problems   Past Medical History:  Diagnosis Date   Cancer of the skin, basal cell 2008   Degenerative joint disease (DJD) of hip    Depression    Gait disorder    Hypertension    Hypokalemia    Iron deficiency anemia    Osteoporosis    Pelvic fracture (HCC)     SOCIAL HX:  Social History   Tobacco Use   Smoking status: Never   Smokeless tobacco: Never  Substance Use Topics   Alcohol use: No     FAMILY HX:  Family History  Problem Relation Age of Onset   Heart failure Mother    Hypertension Father    Diabetes Sister       ALLERGIES:  Allergies  Allergen Reactions   Dicloxacillin Other (See Comments)    Unknown severe allergic reaction   Other Other (See Comments)    Unknown reaction to hazel nuts   Pork-Derived Products Other (See Comments)    Pt does  not eat pork   Septra [Sulfamethoxazole-Trimethoprim] Nausea And Vomiting      PERTINENT MEDICATIONS:  Outpatient Encounter Medications as of 10/29/2021  Medication Sig   acetaminophen (TYLENOL) 650 MG CR tablet Take 650 mg by mouth every 8 (eight) hours as needed for pain.   amLODipine (NORVASC) 2.5 MG tablet Take 2.5 mg by mouth daily.   aspirin EC 81 MG tablet Take 81 mg by mouth daily. Swallow whole.   Calcium Carbonate-Vitamin D 600-400 MG-UNIT tablet Take 1 tablet by mouth every evening.    docusate sodium (COLACE) 100 MG capsule Take 1 capsule (100 mg total) by mouth daily. Hold if has diarrhea   feeding supplement (ENSURE ENLIVE / ENSURE PLUS) LIQD Take 237 mLs by mouth 2 (two) times daily between meals.   Multiple Vitamin (MULTIVITAMIN WITH MINERALS) TABS tablet Take 1 tablet by mouth daily.   ondansetron (ZOFRAN) 4 MG tablet Take 1 tablet (4 mg total) by mouth every 8 (eight) hours as needed for nausea or vomiting. (Patient not taking: No sig reported)   No facility-administered encounter medications on file as of 10/29/2021.     Thank you for the opportunity to participate in the care of Ms. Wiltgen.  The palliative care team will continue to follow. Please call our office at [phone] if we can be of additional assistance.   Note: Portions of this note were generated with Lobbyist. Dictation errors may occur despite best attempts at proofreading.  Teodoro Spray, NP

## 2021-10-31 DIAGNOSIS — I10 Essential (primary) hypertension: Secondary | ICD-10-CM | POA: Diagnosis not present

## 2021-10-31 DIAGNOSIS — S31819D Unspecified open wound of right buttock, subsequent encounter: Secondary | ICD-10-CM | POA: Diagnosis not present

## 2021-10-31 DIAGNOSIS — F0390 Unspecified dementia without behavioral disturbance: Secondary | ICD-10-CM | POA: Diagnosis not present

## 2021-10-31 DIAGNOSIS — D329 Benign neoplasm of meninges, unspecified: Secondary | ICD-10-CM | POA: Diagnosis not present

## 2021-10-31 DIAGNOSIS — H9193 Unspecified hearing loss, bilateral: Secondary | ICD-10-CM | POA: Diagnosis not present

## 2021-10-31 DIAGNOSIS — K5900 Constipation, unspecified: Secondary | ICD-10-CM | POA: Diagnosis not present

## 2021-11-02 DIAGNOSIS — I10 Essential (primary) hypertension: Secondary | ICD-10-CM | POA: Diagnosis not present

## 2021-11-02 DIAGNOSIS — H9193 Unspecified hearing loss, bilateral: Secondary | ICD-10-CM | POA: Diagnosis not present

## 2021-11-02 DIAGNOSIS — D329 Benign neoplasm of meninges, unspecified: Secondary | ICD-10-CM | POA: Diagnosis not present

## 2021-11-02 DIAGNOSIS — K5900 Constipation, unspecified: Secondary | ICD-10-CM | POA: Diagnosis not present

## 2021-11-02 DIAGNOSIS — F0390 Unspecified dementia without behavioral disturbance: Secondary | ICD-10-CM | POA: Diagnosis not present

## 2021-11-02 DIAGNOSIS — S31819D Unspecified open wound of right buttock, subsequent encounter: Secondary | ICD-10-CM | POA: Diagnosis not present

## 2021-11-05 DIAGNOSIS — K5900 Constipation, unspecified: Secondary | ICD-10-CM | POA: Diagnosis not present

## 2021-11-05 DIAGNOSIS — I10 Essential (primary) hypertension: Secondary | ICD-10-CM | POA: Diagnosis not present

## 2021-11-05 DIAGNOSIS — Z20828 Contact with and (suspected) exposure to other viral communicable diseases: Secondary | ICD-10-CM | POA: Diagnosis not present

## 2021-11-05 DIAGNOSIS — S31819D Unspecified open wound of right buttock, subsequent encounter: Secondary | ICD-10-CM | POA: Diagnosis not present

## 2021-11-05 DIAGNOSIS — F0390 Unspecified dementia without behavioral disturbance: Secondary | ICD-10-CM | POA: Diagnosis not present

## 2021-11-05 DIAGNOSIS — D329 Benign neoplasm of meninges, unspecified: Secondary | ICD-10-CM | POA: Diagnosis not present

## 2021-11-05 DIAGNOSIS — H9193 Unspecified hearing loss, bilateral: Secondary | ICD-10-CM | POA: Diagnosis not present

## 2021-11-08 DIAGNOSIS — H9193 Unspecified hearing loss, bilateral: Secondary | ICD-10-CM | POA: Diagnosis not present

## 2021-11-08 DIAGNOSIS — S31819D Unspecified open wound of right buttock, subsequent encounter: Secondary | ICD-10-CM | POA: Diagnosis not present

## 2021-11-08 DIAGNOSIS — D329 Benign neoplasm of meninges, unspecified: Secondary | ICD-10-CM | POA: Diagnosis not present

## 2021-11-08 DIAGNOSIS — F0390 Unspecified dementia without behavioral disturbance: Secondary | ICD-10-CM | POA: Diagnosis not present

## 2021-11-08 DIAGNOSIS — K5900 Constipation, unspecified: Secondary | ICD-10-CM | POA: Diagnosis not present

## 2021-11-08 DIAGNOSIS — I10 Essential (primary) hypertension: Secondary | ICD-10-CM | POA: Diagnosis not present

## 2021-11-09 DIAGNOSIS — K5900 Constipation, unspecified: Secondary | ICD-10-CM | POA: Diagnosis not present

## 2021-11-09 DIAGNOSIS — I10 Essential (primary) hypertension: Secondary | ICD-10-CM | POA: Diagnosis not present

## 2021-11-09 DIAGNOSIS — H9193 Unspecified hearing loss, bilateral: Secondary | ICD-10-CM | POA: Diagnosis not present

## 2021-11-09 DIAGNOSIS — D329 Benign neoplasm of meninges, unspecified: Secondary | ICD-10-CM | POA: Diagnosis not present

## 2021-11-09 DIAGNOSIS — F0390 Unspecified dementia without behavioral disturbance: Secondary | ICD-10-CM | POA: Diagnosis not present

## 2021-11-09 DIAGNOSIS — S31819D Unspecified open wound of right buttock, subsequent encounter: Secondary | ICD-10-CM | POA: Diagnosis not present

## 2021-11-12 DIAGNOSIS — F0390 Unspecified dementia without behavioral disturbance: Secondary | ICD-10-CM | POA: Diagnosis not present

## 2021-11-12 DIAGNOSIS — K5900 Constipation, unspecified: Secondary | ICD-10-CM | POA: Diagnosis not present

## 2021-11-12 DIAGNOSIS — I10 Essential (primary) hypertension: Secondary | ICD-10-CM | POA: Diagnosis not present

## 2021-11-12 DIAGNOSIS — S31819D Unspecified open wound of right buttock, subsequent encounter: Secondary | ICD-10-CM | POA: Diagnosis not present

## 2021-11-12 DIAGNOSIS — D329 Benign neoplasm of meninges, unspecified: Secondary | ICD-10-CM | POA: Diagnosis not present

## 2021-11-12 DIAGNOSIS — H9193 Unspecified hearing loss, bilateral: Secondary | ICD-10-CM | POA: Diagnosis not present

## 2021-11-14 DIAGNOSIS — S31819D Unspecified open wound of right buttock, subsequent encounter: Secondary | ICD-10-CM | POA: Diagnosis not present

## 2021-11-14 DIAGNOSIS — F0390 Unspecified dementia without behavioral disturbance: Secondary | ICD-10-CM | POA: Diagnosis not present

## 2021-11-14 DIAGNOSIS — K5900 Constipation, unspecified: Secondary | ICD-10-CM | POA: Diagnosis not present

## 2021-11-14 DIAGNOSIS — I10 Essential (primary) hypertension: Secondary | ICD-10-CM | POA: Diagnosis not present

## 2021-11-14 DIAGNOSIS — D329 Benign neoplasm of meninges, unspecified: Secondary | ICD-10-CM | POA: Diagnosis not present

## 2021-11-14 DIAGNOSIS — H9193 Unspecified hearing loss, bilateral: Secondary | ICD-10-CM | POA: Diagnosis not present

## 2021-11-15 DIAGNOSIS — I739 Peripheral vascular disease, unspecified: Secondary | ICD-10-CM | POA: Diagnosis not present

## 2021-11-15 DIAGNOSIS — F0390 Unspecified dementia without behavioral disturbance: Secondary | ICD-10-CM | POA: Diagnosis not present

## 2021-11-15 DIAGNOSIS — I10 Essential (primary) hypertension: Secondary | ICD-10-CM | POA: Diagnosis not present

## 2021-11-15 DIAGNOSIS — S31819D Unspecified open wound of right buttock, subsequent encounter: Secondary | ICD-10-CM | POA: Diagnosis not present

## 2021-11-15 DIAGNOSIS — R600 Localized edema: Secondary | ICD-10-CM | POA: Diagnosis not present

## 2021-11-15 DIAGNOSIS — D329 Benign neoplasm of meninges, unspecified: Secondary | ICD-10-CM | POA: Diagnosis not present

## 2021-11-15 DIAGNOSIS — H9193 Unspecified hearing loss, bilateral: Secondary | ICD-10-CM | POA: Diagnosis not present

## 2021-11-15 DIAGNOSIS — K5904 Chronic idiopathic constipation: Secondary | ICD-10-CM | POA: Diagnosis not present

## 2021-11-15 DIAGNOSIS — K5900 Constipation, unspecified: Secondary | ICD-10-CM | POA: Diagnosis not present

## 2021-11-15 DIAGNOSIS — E559 Vitamin D deficiency, unspecified: Secondary | ICD-10-CM | POA: Diagnosis not present

## 2021-11-20 DIAGNOSIS — H9193 Unspecified hearing loss, bilateral: Secondary | ICD-10-CM | POA: Diagnosis not present

## 2021-11-20 DIAGNOSIS — S31819D Unspecified open wound of right buttock, subsequent encounter: Secondary | ICD-10-CM | POA: Diagnosis not present

## 2021-11-20 DIAGNOSIS — I10 Essential (primary) hypertension: Secondary | ICD-10-CM | POA: Diagnosis not present

## 2021-11-20 DIAGNOSIS — K5900 Constipation, unspecified: Secondary | ICD-10-CM | POA: Diagnosis not present

## 2021-11-20 DIAGNOSIS — F0390 Unspecified dementia without behavioral disturbance: Secondary | ICD-10-CM | POA: Diagnosis not present

## 2021-11-20 DIAGNOSIS — D329 Benign neoplasm of meninges, unspecified: Secondary | ICD-10-CM | POA: Diagnosis not present

## 2021-11-21 DIAGNOSIS — I10 Essential (primary) hypertension: Secondary | ICD-10-CM | POA: Diagnosis not present

## 2021-11-21 DIAGNOSIS — D329 Benign neoplasm of meninges, unspecified: Secondary | ICD-10-CM | POA: Diagnosis not present

## 2021-11-21 DIAGNOSIS — F0390 Unspecified dementia without behavioral disturbance: Secondary | ICD-10-CM | POA: Diagnosis not present

## 2021-11-21 DIAGNOSIS — H9193 Unspecified hearing loss, bilateral: Secondary | ICD-10-CM | POA: Diagnosis not present

## 2021-11-21 DIAGNOSIS — S31819D Unspecified open wound of right buttock, subsequent encounter: Secondary | ICD-10-CM | POA: Diagnosis not present

## 2021-11-21 DIAGNOSIS — K5900 Constipation, unspecified: Secondary | ICD-10-CM | POA: Diagnosis not present

## 2021-11-23 ENCOUNTER — Other Ambulatory Visit: Payer: Self-pay

## 2021-11-23 ENCOUNTER — Other Ambulatory Visit: Payer: Medicare Other | Admitting: Hospice

## 2021-11-23 DIAGNOSIS — F0390 Unspecified dementia without behavioral disturbance: Secondary | ICD-10-CM | POA: Diagnosis not present

## 2021-11-23 DIAGNOSIS — R269 Unspecified abnormalities of gait and mobility: Secondary | ICD-10-CM

## 2021-11-23 DIAGNOSIS — S31819D Unspecified open wound of right buttock, subsequent encounter: Secondary | ICD-10-CM | POA: Diagnosis not present

## 2021-11-23 DIAGNOSIS — Z515 Encounter for palliative care: Secondary | ICD-10-CM

## 2021-11-23 DIAGNOSIS — D329 Benign neoplasm of meninges, unspecified: Secondary | ICD-10-CM | POA: Diagnosis not present

## 2021-11-23 DIAGNOSIS — H9193 Unspecified hearing loss, bilateral: Secondary | ICD-10-CM | POA: Diagnosis not present

## 2021-11-23 DIAGNOSIS — R5381 Other malaise: Secondary | ICD-10-CM | POA: Diagnosis not present

## 2021-11-23 DIAGNOSIS — I10 Essential (primary) hypertension: Secondary | ICD-10-CM | POA: Diagnosis not present

## 2021-11-23 DIAGNOSIS — K5900 Constipation, unspecified: Secondary | ICD-10-CM | POA: Diagnosis not present

## 2021-11-23 NOTE — Progress Notes (Signed)
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 Palliative Care was asked to follow this patient by consultation request of  Clide Deutscher, NP to address advance care planning, complex medical decision making and goals of care clarification.  NP called [name] and left her a voicemail with callback number. Patient is hard of hearing, uses a white board for communication.     ASSESSMENT AND / RECOMMENDATIONS:   CODE STATUS: Patient is a Do Not Resuscitate.  Goals of Care: Goals include to maximize quality of life and symptom management. Family is interested in hospice care in the future when patient qualifies for it. Visit consisted of counseling and education dealing with the complex and emotionally intense issues of symptom management and palliative care in the setting of serious and potentially life-threatening illness. Palliative care team will continue to support patient, patient's family, and medical team.  Symptom Management/Plan: Debility: secondary to advanced Dementia, protein caloric malnutrition, weight loss. Patient is now non ambulatory, requires assistance in ADLs. Continue ongoing supportive care.  Provide assistance during meals to ensure adequate oral intake.  Ensure twice daily. Gait Disturbance: with repeated falls several times  a day in the past; currently non ambulatory. She is working with physical therapy with [facility] [employer]. Patient is a hight fall risk. Safety and Fall  precautions in place.  Dementia:  Progressive Dementia. Memory loss and confusion, incontinent of bowel and bladder, FAST 6D. Continue to encourage reminiscence, word search/puzzles, cueing for recollection.  Promote calm approach and engaging environment  HTN: Managed with Amlodipine. Follow up: Palliative care will continue to follow for complex medical decision making, advance care planning, and clarification of goals. Return 6 weeks or prn. Encouraged to call provider sooner with any concerns.   Family /Caregiver/Community Supports: Patient lives at home independently with assistance from [place] home. Home health services in place.   HOSPICE ELIGIBILITY/DIAGNOSIS: TBD  Chief Complaint: Follow up visit  HISTORY OF PRESENT ILLNESS:  DECLYN OFFIELD is a 88 y.o. year old female  with multiple medical conditions including advanced dementia without behavior , gait disturbance, falls, She is currently non ambulatory since not safe at this time; patient continues to work with [facility] physical therapy. History of Tuberculosis - latent since 1953, HTN. Nursing staff [place] with no concerns today.  History obtained from review of EMR, discussion with primary team, caregiver, family and/or Ms. Lafontaine.  Review and summarization of Epic records shows history from other than patient. Rest of 10 point ROS asked and negative.  I reviewed as needed, available labs, patient records, imaging, studies and related documents from the EMR. Sodium 135 - 145 mmol/L 138  139  140  137   137  138   Potassium 3.5 - 5.1 mmol/L 3.5  3.4 Low   3.4 Low   3.6   3.8  3.4 Low  Chloride 98 - 111 mmol/L 103  103  103  106   107  104 R   CO2 22 - 32 mmol/L 22  25  25  22   23  26    Glucose, Bld 70 - 99 mg/dL 95  101 High  CM  113 High  CM  125 High    116 High   107 High  R    Comment: Glucose reference range applies only to samples taken after fasting for at least 8 hours.  BUN 8 - 23 mg/dL 14  19  24  High   19   19  10  R   Creatinine, Ser 0.44 - 1.00 mg/dL 0.48  0.74  1.12 High   0.93   0.95  0.83   Calcium 8.9 - 10.3 mg/dL 8.6 Low   9.2  9.7  8.9   9.3  8.5 Low    Total Protein 6.5 - 8.1 g/dL 7.0  7.9     6.8    Albumin 3.5 - 5.0 g/dL 3.2 Low   3.9    3.6 CM  3.6    AST 15 - 41 U/L 22  23     22     ALT 0 - 44 U/L 13  14     17     Alkaline Phosphatase 38 - 126 U/L 58  72     62    Total Bilirubin 0.3 - 1.2 mg/dL 1.3 High   1.5 High      0.7    GFR, Estimated >60 mL/min >60  >60 CM        Comment: (NOTE)  Calculated using the CKD-EPI Creatinine Equation (2021)   Anion gap 5 - 15 13  11  CM  12 CM  9 CM        Physical Exam: Height/Weight: 4 feet 10 inches/102 Ibs Constitutional: NAD, hard of hearing, white board for writing for communication to patient General: Well groomed, cooperative EYES: anicteric sclera, lids intact, no discharge  ENMT: Moist mucous membrane CV: S1 S2, RRR, no LE edema Pulmonary: LCTA, no increased work of breathing, no cough, Abdomen: active BS + 4 quadrants, soft and non tender GU: no suprapubic tenderness MSK: weakness, sarcopenia, non ambulatory,  gets around on wheelchair, assisted by staff. Skin: warm and dry, no rashes or wounds on visible skin Neuro:  weakness, otherwise non focal Psych: non-anxious affect Hem/lymph/immuno: no widespread bruising   PAST MEDICAL HISTORY:  Active Ambulatory Problems    Diagnosis Date Noted   Subdural hematoma 07/08/2017   Frequent falls 07/08/2017   Acute lower UTI 82/50/5397   Acute metabolic encephalopathy 67/34/1937   Fall    Hypokalemia, inadequate intake 07/09/2017   Leukocytosis 01/02/2019   Pubic ramus fracture ([place]) 01/02/2019   Hip pain 01/02/2019   Essential hypertension 01/02/2019   Normocytic anemia 01/04/2019   Meningioma ([place]) 08/09/2021   Mixed conductive and  sensorineural hearing loss of both ears 07/21/2019   Resolved Ambulatory Problems    Diagnosis Date Noted   No Resolved Ambulatory Problems   Past Medical History:  Diagnosis Date   Cancer of the skin, basal cell 2008   Degenerative joint disease (DJD) of hip    Depression    Gait disorder    Hypertension    Hypokalemia    Iron deficiency anemia    Osteoporosis    Pelvic fracture (HCC)     SOCIAL HX:  Social History   Tobacco Use   Smoking status:  Never   Smokeless tobacco: Never  Substance Use Topics   Alcohol use: No     FAMILY HX:  Family History  Problem Relation Age of Onset   Heart failure Mother    Hypertension Father    Diabetes Sister       ALLERGIES:  Allergies  Allergen Reactions   Dicloxacillin Other (See Comments)    Unknown severe allergic reaction   Other Other (See Comments)    Unknown reaction to hazel nuts   Pork-Derived Products Other (See Comments)    Pt does not eat pork   Septra [Sulfamethoxazole-Trimethoprim] Nausea And Vomiting      PERTINENT MEDICATIONS:  Outpatient Encounter Medications as of 11/23/2021  Medication Sig   acetaminophen (TYLENOL) 650 MG CR tablet Take 650 mg by mouth every 8 (eight) hours as needed for pain.   amLODipine (NORVASC) 2.5 MG tablet Take 2.5 mg by mouth daily.   aspirin EC 81 MG tablet Take 81 mg by mouth daily. Swallow whole.   Calcium Carbonate-Vitamin D 600-400 MG-UNIT tablet Take 1 tablet by mouth every evening.    docusate sodium (COLACE) 100 MG capsule Take 1 capsule (100 mg total) by mouth daily. Hold if has diarrhea   feeding supplement (ENSURE ENLIVE / ENSURE PLUS) LIQD Take 237 mLs by mouth 2 (two) times daily between meals.   Multiple Vitamin (MULTIVITAMIN WITH MINERALS) TABS tablet Take 1 tablet by mouth daily.   ondansetron (ZOFRAN) 4 MG tablet Take 1 tablet (4 mg total) by mouth every 8 (eight) hours as needed for nausea or vomiting. (Patient not taking: No sig reported)   No  facility-administered encounter medications on file as of 11/23/2021.    I spent 60 minutes providing this consultation; this includes time spent with patient/family, chart review and documentation. More than 50% of the time in this consultation was spent on counseling and coordinating communication.  Thank you for the opportunity to participate in the care of Ms. Nygren.  The palliative care team will continue to follow. Please call our office at [phone] if we can be of additional assistance.   Note: Portions of this note were generated with Lobbyist. Dictation errors may occur despite best attempts at proofreading.  Teodoro Spray, NP

## 2021-11-24 DIAGNOSIS — S31819D Unspecified open wound of right buttock, subsequent encounter: Secondary | ICD-10-CM | POA: Diagnosis not present

## 2021-11-24 DIAGNOSIS — K5900 Constipation, unspecified: Secondary | ICD-10-CM | POA: Diagnosis not present

## 2021-11-24 DIAGNOSIS — Z87440 Personal history of urinary (tract) infections: Secondary | ICD-10-CM | POA: Diagnosis not present

## 2021-11-24 DIAGNOSIS — I10 Essential (primary) hypertension: Secondary | ICD-10-CM | POA: Diagnosis not present

## 2021-11-24 DIAGNOSIS — R159 Full incontinence of feces: Secondary | ICD-10-CM | POA: Diagnosis not present

## 2021-11-24 DIAGNOSIS — R32 Unspecified urinary incontinence: Secondary | ICD-10-CM | POA: Diagnosis not present

## 2021-11-24 DIAGNOSIS — F0390 Unspecified dementia without behavioral disturbance: Secondary | ICD-10-CM | POA: Diagnosis not present

## 2021-11-24 DIAGNOSIS — Z993 Dependence on wheelchair: Secondary | ICD-10-CM | POA: Diagnosis not present

## 2021-11-24 DIAGNOSIS — Z9181 History of falling: Secondary | ICD-10-CM | POA: Diagnosis not present

## 2021-11-24 DIAGNOSIS — H9193 Unspecified hearing loss, bilateral: Secondary | ICD-10-CM | POA: Diagnosis not present

## 2021-11-24 DIAGNOSIS — D329 Benign neoplasm of meninges, unspecified: Secondary | ICD-10-CM | POA: Diagnosis not present

## 2021-11-28 DIAGNOSIS — H9193 Unspecified hearing loss, bilateral: Secondary | ICD-10-CM | POA: Diagnosis not present

## 2021-11-28 DIAGNOSIS — S31819D Unspecified open wound of right buttock, subsequent encounter: Secondary | ICD-10-CM | POA: Diagnosis not present

## 2021-11-28 DIAGNOSIS — K5900 Constipation, unspecified: Secondary | ICD-10-CM | POA: Diagnosis not present

## 2021-11-28 DIAGNOSIS — I10 Essential (primary) hypertension: Secondary | ICD-10-CM | POA: Diagnosis not present

## 2021-11-28 DIAGNOSIS — F0390 Unspecified dementia without behavioral disturbance: Secondary | ICD-10-CM | POA: Diagnosis not present

## 2021-11-28 DIAGNOSIS — D329 Benign neoplasm of meninges, unspecified: Secondary | ICD-10-CM | POA: Diagnosis not present

## 2021-11-29 DIAGNOSIS — D329 Benign neoplasm of meninges, unspecified: Secondary | ICD-10-CM | POA: Diagnosis not present

## 2021-11-29 DIAGNOSIS — F0390 Unspecified dementia without behavioral disturbance: Secondary | ICD-10-CM | POA: Diagnosis not present

## 2021-11-29 DIAGNOSIS — H9193 Unspecified hearing loss, bilateral: Secondary | ICD-10-CM | POA: Diagnosis not present

## 2021-11-29 DIAGNOSIS — I10 Essential (primary) hypertension: Secondary | ICD-10-CM | POA: Diagnosis not present

## 2021-11-29 DIAGNOSIS — K5900 Constipation, unspecified: Secondary | ICD-10-CM | POA: Diagnosis not present

## 2021-11-29 DIAGNOSIS — S31819D Unspecified open wound of right buttock, subsequent encounter: Secondary | ICD-10-CM | POA: Diagnosis not present

## 2021-12-03 DIAGNOSIS — K5901 Slow transit constipation: Secondary | ICD-10-CM | POA: Diagnosis not present

## 2021-12-03 DIAGNOSIS — K5900 Constipation, unspecified: Secondary | ICD-10-CM | POA: Diagnosis not present

## 2021-12-03 DIAGNOSIS — D329 Benign neoplasm of meninges, unspecified: Secondary | ICD-10-CM | POA: Diagnosis not present

## 2021-12-03 DIAGNOSIS — S31819D Unspecified open wound of right buttock, subsequent encounter: Secondary | ICD-10-CM | POA: Diagnosis not present

## 2021-12-03 DIAGNOSIS — L98411 Non-pressure chronic ulcer of buttock limited to breakdown of skin: Secondary | ICD-10-CM | POA: Diagnosis not present

## 2021-12-03 DIAGNOSIS — F0390 Unspecified dementia without behavioral disturbance: Secondary | ICD-10-CM | POA: Diagnosis not present

## 2021-12-03 DIAGNOSIS — H9193 Unspecified hearing loss, bilateral: Secondary | ICD-10-CM | POA: Diagnosis not present

## 2021-12-03 DIAGNOSIS — I10 Essential (primary) hypertension: Secondary | ICD-10-CM | POA: Diagnosis not present

## 2021-12-03 DIAGNOSIS — Z9181 History of falling: Secondary | ICD-10-CM | POA: Diagnosis not present

## 2021-12-06 DIAGNOSIS — F0390 Unspecified dementia without behavioral disturbance: Secondary | ICD-10-CM | POA: Diagnosis not present

## 2021-12-06 DIAGNOSIS — K5901 Slow transit constipation: Secondary | ICD-10-CM | POA: Diagnosis not present

## 2021-12-06 DIAGNOSIS — I10 Essential (primary) hypertension: Secondary | ICD-10-CM | POA: Diagnosis not present

## 2021-12-06 DIAGNOSIS — H9193 Unspecified hearing loss, bilateral: Secondary | ICD-10-CM | POA: Diagnosis not present

## 2021-12-06 DIAGNOSIS — D329 Benign neoplasm of meninges, unspecified: Secondary | ICD-10-CM | POA: Diagnosis not present

## 2021-12-06 DIAGNOSIS — S31819D Unspecified open wound of right buttock, subsequent encounter: Secondary | ICD-10-CM | POA: Diagnosis not present

## 2021-12-06 DIAGNOSIS — K5900 Constipation, unspecified: Secondary | ICD-10-CM | POA: Diagnosis not present

## 2021-12-10 DIAGNOSIS — I10 Essential (primary) hypertension: Secondary | ICD-10-CM | POA: Diagnosis not present

## 2021-12-10 DIAGNOSIS — Z8781 Personal history of (healed) traumatic fracture: Secondary | ICD-10-CM | POA: Diagnosis not present

## 2021-12-10 DIAGNOSIS — K5900 Constipation, unspecified: Secondary | ICD-10-CM | POA: Diagnosis not present

## 2021-12-10 DIAGNOSIS — F0283 Dementia in other diseases classified elsewhere, unspecified severity, with mood disturbance: Secondary | ICD-10-CM | POA: Diagnosis not present

## 2021-12-10 DIAGNOSIS — D329 Benign neoplasm of meninges, unspecified: Secondary | ICD-10-CM | POA: Diagnosis not present

## 2021-12-10 DIAGNOSIS — S31819D Unspecified open wound of right buttock, subsequent encounter: Secondary | ICD-10-CM | POA: Diagnosis not present

## 2021-12-10 DIAGNOSIS — H9193 Unspecified hearing loss, bilateral: Secondary | ICD-10-CM | POA: Diagnosis not present

## 2021-12-10 DIAGNOSIS — R296 Repeated falls: Secondary | ICD-10-CM | POA: Diagnosis not present

## 2021-12-10 DIAGNOSIS — F0390 Unspecified dementia without behavioral disturbance: Secondary | ICD-10-CM | POA: Diagnosis not present

## 2021-12-10 DIAGNOSIS — G311 Senile degeneration of brain, not elsewhere classified: Secondary | ICD-10-CM | POA: Diagnosis not present

## 2021-12-11 DIAGNOSIS — I10 Essential (primary) hypertension: Secondary | ICD-10-CM | POA: Diagnosis not present

## 2021-12-11 DIAGNOSIS — R296 Repeated falls: Secondary | ICD-10-CM | POA: Diagnosis not present

## 2021-12-11 DIAGNOSIS — F0283 Dementia in other diseases classified elsewhere, unspecified severity, with mood disturbance: Secondary | ICD-10-CM | POA: Diagnosis not present

## 2021-12-11 DIAGNOSIS — G311 Senile degeneration of brain, not elsewhere classified: Secondary | ICD-10-CM | POA: Diagnosis not present

## 2021-12-11 DIAGNOSIS — Z8781 Personal history of (healed) traumatic fracture: Secondary | ICD-10-CM | POA: Diagnosis not present

## 2021-12-12 DIAGNOSIS — I10 Essential (primary) hypertension: Secondary | ICD-10-CM | POA: Diagnosis not present

## 2021-12-12 DIAGNOSIS — G311 Senile degeneration of brain, not elsewhere classified: Secondary | ICD-10-CM | POA: Diagnosis not present

## 2021-12-12 DIAGNOSIS — R296 Repeated falls: Secondary | ICD-10-CM | POA: Diagnosis not present

## 2021-12-12 DIAGNOSIS — F0283 Dementia in other diseases classified elsewhere, unspecified severity, with mood disturbance: Secondary | ICD-10-CM | POA: Diagnosis not present

## 2021-12-12 DIAGNOSIS — Z8781 Personal history of (healed) traumatic fracture: Secondary | ICD-10-CM | POA: Diagnosis not present

## 2021-12-17 DIAGNOSIS — Z8781 Personal history of (healed) traumatic fracture: Secondary | ICD-10-CM | POA: Diagnosis not present

## 2021-12-17 DIAGNOSIS — R296 Repeated falls: Secondary | ICD-10-CM | POA: Diagnosis not present

## 2021-12-17 DIAGNOSIS — I10 Essential (primary) hypertension: Secondary | ICD-10-CM | POA: Diagnosis not present

## 2021-12-17 DIAGNOSIS — G311 Senile degeneration of brain, not elsewhere classified: Secondary | ICD-10-CM | POA: Diagnosis not present

## 2021-12-17 DIAGNOSIS — F0283 Dementia in other diseases classified elsewhere, unspecified severity, with mood disturbance: Secondary | ICD-10-CM | POA: Diagnosis not present

## 2021-12-18 DIAGNOSIS — R296 Repeated falls: Secondary | ICD-10-CM | POA: Diagnosis not present

## 2021-12-18 DIAGNOSIS — I10 Essential (primary) hypertension: Secondary | ICD-10-CM | POA: Diagnosis not present

## 2021-12-18 DIAGNOSIS — G311 Senile degeneration of brain, not elsewhere classified: Secondary | ICD-10-CM | POA: Diagnosis not present

## 2021-12-18 DIAGNOSIS — Z8781 Personal history of (healed) traumatic fracture: Secondary | ICD-10-CM | POA: Diagnosis not present

## 2021-12-18 DIAGNOSIS — F0283 Dementia in other diseases classified elsewhere, unspecified severity, with mood disturbance: Secondary | ICD-10-CM | POA: Diagnosis not present

## 2021-12-19 DIAGNOSIS — I10 Essential (primary) hypertension: Secondary | ICD-10-CM | POA: Diagnosis not present

## 2021-12-19 DIAGNOSIS — F0283 Dementia in other diseases classified elsewhere, unspecified severity, with mood disturbance: Secondary | ICD-10-CM | POA: Diagnosis not present

## 2021-12-19 DIAGNOSIS — R296 Repeated falls: Secondary | ICD-10-CM | POA: Diagnosis not present

## 2021-12-19 DIAGNOSIS — G311 Senile degeneration of brain, not elsewhere classified: Secondary | ICD-10-CM | POA: Diagnosis not present

## 2021-12-19 DIAGNOSIS — Z8781 Personal history of (healed) traumatic fracture: Secondary | ICD-10-CM | POA: Diagnosis not present

## 2021-12-20 DIAGNOSIS — Z8781 Personal history of (healed) traumatic fracture: Secondary | ICD-10-CM | POA: Diagnosis not present

## 2021-12-20 DIAGNOSIS — I10 Essential (primary) hypertension: Secondary | ICD-10-CM | POA: Diagnosis not present

## 2021-12-20 DIAGNOSIS — F0283 Dementia in other diseases classified elsewhere, unspecified severity, with mood disturbance: Secondary | ICD-10-CM | POA: Diagnosis not present

## 2021-12-20 DIAGNOSIS — R296 Repeated falls: Secondary | ICD-10-CM | POA: Diagnosis not present

## 2021-12-20 DIAGNOSIS — G311 Senile degeneration of brain, not elsewhere classified: Secondary | ICD-10-CM | POA: Diagnosis not present

## 2021-12-21 ENCOUNTER — Other Ambulatory Visit: Payer: Self-pay

## 2021-12-21 ENCOUNTER — Other Ambulatory Visit: Payer: Medicare Other | Admitting: Hospice

## 2021-12-21 DIAGNOSIS — Z515 Encounter for palliative care: Secondary | ICD-10-CM

## 2021-12-21 DIAGNOSIS — R296 Repeated falls: Secondary | ICD-10-CM | POA: Diagnosis not present

## 2021-12-21 DIAGNOSIS — F0283 Dementia in other diseases classified elsewhere, unspecified severity, with mood disturbance: Secondary | ICD-10-CM | POA: Diagnosis not present

## 2021-12-21 DIAGNOSIS — G311 Senile degeneration of brain, not elsewhere classified: Secondary | ICD-10-CM | POA: Diagnosis not present

## 2021-12-21 DIAGNOSIS — Z8781 Personal history of (healed) traumatic fracture: Secondary | ICD-10-CM | POA: Diagnosis not present

## 2021-12-21 DIAGNOSIS — I10 Essential (primary) hypertension: Secondary | ICD-10-CM | POA: Diagnosis not present

## 2021-12-21 NOTE — Progress Notes (Signed)
Meeting with patient, her daughter and son in law did not continue because patient is now under hospice service.  ?

## 2021-12-25 DIAGNOSIS — I10 Essential (primary) hypertension: Secondary | ICD-10-CM | POA: Diagnosis not present

## 2021-12-25 DIAGNOSIS — R296 Repeated falls: Secondary | ICD-10-CM | POA: Diagnosis not present

## 2021-12-25 DIAGNOSIS — F0283 Dementia in other diseases classified elsewhere, unspecified severity, with mood disturbance: Secondary | ICD-10-CM | POA: Diagnosis not present

## 2021-12-25 DIAGNOSIS — G311 Senile degeneration of brain, not elsewhere classified: Secondary | ICD-10-CM | POA: Diagnosis not present

## 2021-12-25 DIAGNOSIS — Z8781 Personal history of (healed) traumatic fracture: Secondary | ICD-10-CM | POA: Diagnosis not present

## 2021-12-27 DIAGNOSIS — R296 Repeated falls: Secondary | ICD-10-CM | POA: Diagnosis not present

## 2021-12-27 DIAGNOSIS — G311 Senile degeneration of brain, not elsewhere classified: Secondary | ICD-10-CM | POA: Diagnosis not present

## 2021-12-27 DIAGNOSIS — F0283 Dementia in other diseases classified elsewhere, unspecified severity, with mood disturbance: Secondary | ICD-10-CM | POA: Diagnosis not present

## 2021-12-27 DIAGNOSIS — I10 Essential (primary) hypertension: Secondary | ICD-10-CM | POA: Diagnosis not present

## 2021-12-27 DIAGNOSIS — Z8781 Personal history of (healed) traumatic fracture: Secondary | ICD-10-CM | POA: Diagnosis not present

## 2022-01-01 DIAGNOSIS — G311 Senile degeneration of brain, not elsewhere classified: Secondary | ICD-10-CM | POA: Diagnosis not present

## 2022-01-01 DIAGNOSIS — F0283 Dementia in other diseases classified elsewhere, unspecified severity, with mood disturbance: Secondary | ICD-10-CM | POA: Diagnosis not present

## 2022-01-01 DIAGNOSIS — Z8781 Personal history of (healed) traumatic fracture: Secondary | ICD-10-CM | POA: Diagnosis not present

## 2022-01-01 DIAGNOSIS — R296 Repeated falls: Secondary | ICD-10-CM | POA: Diagnosis not present

## 2022-01-01 DIAGNOSIS — I10 Essential (primary) hypertension: Secondary | ICD-10-CM | POA: Diagnosis not present

## 2022-01-02 DIAGNOSIS — I10 Essential (primary) hypertension: Secondary | ICD-10-CM | POA: Diagnosis not present

## 2022-01-02 DIAGNOSIS — G311 Senile degeneration of brain, not elsewhere classified: Secondary | ICD-10-CM | POA: Diagnosis not present

## 2022-01-02 DIAGNOSIS — R296 Repeated falls: Secondary | ICD-10-CM | POA: Diagnosis not present

## 2022-01-02 DIAGNOSIS — F0283 Dementia in other diseases classified elsewhere, unspecified severity, with mood disturbance: Secondary | ICD-10-CM | POA: Diagnosis not present

## 2022-01-02 DIAGNOSIS — Z8781 Personal history of (healed) traumatic fracture: Secondary | ICD-10-CM | POA: Diagnosis not present

## 2022-01-03 DIAGNOSIS — I10 Essential (primary) hypertension: Secondary | ICD-10-CM | POA: Diagnosis not present

## 2022-01-03 DIAGNOSIS — R296 Repeated falls: Secondary | ICD-10-CM | POA: Diagnosis not present

## 2022-01-03 DIAGNOSIS — Z8781 Personal history of (healed) traumatic fracture: Secondary | ICD-10-CM | POA: Diagnosis not present

## 2022-01-03 DIAGNOSIS — F0283 Dementia in other diseases classified elsewhere, unspecified severity, with mood disturbance: Secondary | ICD-10-CM | POA: Diagnosis not present

## 2022-01-03 DIAGNOSIS — G311 Senile degeneration of brain, not elsewhere classified: Secondary | ICD-10-CM | POA: Diagnosis not present

## 2022-01-04 DIAGNOSIS — G311 Senile degeneration of brain, not elsewhere classified: Secondary | ICD-10-CM | POA: Diagnosis not present

## 2022-01-04 DIAGNOSIS — F0283 Dementia in other diseases classified elsewhere, unspecified severity, with mood disturbance: Secondary | ICD-10-CM | POA: Diagnosis not present

## 2022-01-04 DIAGNOSIS — I10 Essential (primary) hypertension: Secondary | ICD-10-CM | POA: Diagnosis not present

## 2022-01-04 DIAGNOSIS — Z8781 Personal history of (healed) traumatic fracture: Secondary | ICD-10-CM | POA: Diagnosis not present

## 2022-01-04 DIAGNOSIS — R296 Repeated falls: Secondary | ICD-10-CM | POA: Diagnosis not present

## 2022-01-05 DIAGNOSIS — G311 Senile degeneration of brain, not elsewhere classified: Secondary | ICD-10-CM | POA: Diagnosis not present

## 2022-01-05 DIAGNOSIS — I10 Essential (primary) hypertension: Secondary | ICD-10-CM | POA: Diagnosis not present

## 2022-01-05 DIAGNOSIS — Z8781 Personal history of (healed) traumatic fracture: Secondary | ICD-10-CM | POA: Diagnosis not present

## 2022-01-05 DIAGNOSIS — F0283 Dementia in other diseases classified elsewhere, unspecified severity, with mood disturbance: Secondary | ICD-10-CM | POA: Diagnosis not present

## 2022-01-05 DIAGNOSIS — R296 Repeated falls: Secondary | ICD-10-CM | POA: Diagnosis not present

## 2022-01-07 DIAGNOSIS — I10 Essential (primary) hypertension: Secondary | ICD-10-CM | POA: Diagnosis not present

## 2022-01-07 DIAGNOSIS — F0283 Dementia in other diseases classified elsewhere, unspecified severity, with mood disturbance: Secondary | ICD-10-CM | POA: Diagnosis not present

## 2022-01-07 DIAGNOSIS — Z8781 Personal history of (healed) traumatic fracture: Secondary | ICD-10-CM | POA: Diagnosis not present

## 2022-01-07 DIAGNOSIS — G311 Senile degeneration of brain, not elsewhere classified: Secondary | ICD-10-CM | POA: Diagnosis not present

## 2022-01-07 DIAGNOSIS — R296 Repeated falls: Secondary | ICD-10-CM | POA: Diagnosis not present

## 2022-01-08 DIAGNOSIS — I10 Essential (primary) hypertension: Secondary | ICD-10-CM | POA: Diagnosis not present

## 2022-01-08 DIAGNOSIS — Z8781 Personal history of (healed) traumatic fracture: Secondary | ICD-10-CM | POA: Diagnosis not present

## 2022-01-08 DIAGNOSIS — G311 Senile degeneration of brain, not elsewhere classified: Secondary | ICD-10-CM | POA: Diagnosis not present

## 2022-01-08 DIAGNOSIS — F0283 Dementia in other diseases classified elsewhere, unspecified severity, with mood disturbance: Secondary | ICD-10-CM | POA: Diagnosis not present

## 2022-01-08 DIAGNOSIS — R296 Repeated falls: Secondary | ICD-10-CM | POA: Diagnosis not present

## 2022-01-09 DIAGNOSIS — I10 Essential (primary) hypertension: Secondary | ICD-10-CM | POA: Diagnosis not present

## 2022-01-09 DIAGNOSIS — G311 Senile degeneration of brain, not elsewhere classified: Secondary | ICD-10-CM | POA: Diagnosis not present

## 2022-01-09 DIAGNOSIS — R296 Repeated falls: Secondary | ICD-10-CM | POA: Diagnosis not present

## 2022-01-09 DIAGNOSIS — F0283 Dementia in other diseases classified elsewhere, unspecified severity, with mood disturbance: Secondary | ICD-10-CM | POA: Diagnosis not present

## 2022-01-09 DIAGNOSIS — Z8781 Personal history of (healed) traumatic fracture: Secondary | ICD-10-CM | POA: Diagnosis not present

## 2022-01-11 DIAGNOSIS — F0283 Dementia in other diseases classified elsewhere, unspecified severity, with mood disturbance: Secondary | ICD-10-CM | POA: Diagnosis not present

## 2022-01-11 DIAGNOSIS — I10 Essential (primary) hypertension: Secondary | ICD-10-CM | POA: Diagnosis not present

## 2022-01-11 DIAGNOSIS — R296 Repeated falls: Secondary | ICD-10-CM | POA: Diagnosis not present

## 2022-01-11 DIAGNOSIS — Z8781 Personal history of (healed) traumatic fracture: Secondary | ICD-10-CM | POA: Diagnosis not present

## 2022-01-11 DIAGNOSIS — G311 Senile degeneration of brain, not elsewhere classified: Secondary | ICD-10-CM | POA: Diagnosis not present

## 2022-01-14 DIAGNOSIS — G311 Senile degeneration of brain, not elsewhere classified: Secondary | ICD-10-CM | POA: Diagnosis not present

## 2022-01-14 DIAGNOSIS — R296 Repeated falls: Secondary | ICD-10-CM | POA: Diagnosis not present

## 2022-01-14 DIAGNOSIS — F0283 Dementia in other diseases classified elsewhere, unspecified severity, with mood disturbance: Secondary | ICD-10-CM | POA: Diagnosis not present

## 2022-01-14 DIAGNOSIS — Z8781 Personal history of (healed) traumatic fracture: Secondary | ICD-10-CM | POA: Diagnosis not present

## 2022-01-14 DIAGNOSIS — I10 Essential (primary) hypertension: Secondary | ICD-10-CM | POA: Diagnosis not present

## 2022-01-16 DIAGNOSIS — F0283 Dementia in other diseases classified elsewhere, unspecified severity, with mood disturbance: Secondary | ICD-10-CM | POA: Diagnosis not present

## 2022-01-16 DIAGNOSIS — Z8781 Personal history of (healed) traumatic fracture: Secondary | ICD-10-CM | POA: Diagnosis not present

## 2022-01-16 DIAGNOSIS — R296 Repeated falls: Secondary | ICD-10-CM | POA: Diagnosis not present

## 2022-01-16 DIAGNOSIS — G311 Senile degeneration of brain, not elsewhere classified: Secondary | ICD-10-CM | POA: Diagnosis not present

## 2022-01-16 DIAGNOSIS — I10 Essential (primary) hypertension: Secondary | ICD-10-CM | POA: Diagnosis not present

## 2022-01-18 DIAGNOSIS — Z8781 Personal history of (healed) traumatic fracture: Secondary | ICD-10-CM | POA: Diagnosis not present

## 2022-01-18 DIAGNOSIS — R296 Repeated falls: Secondary | ICD-10-CM | POA: Diagnosis not present

## 2022-01-18 DIAGNOSIS — I10 Essential (primary) hypertension: Secondary | ICD-10-CM | POA: Diagnosis not present

## 2022-01-18 DIAGNOSIS — F0283 Dementia in other diseases classified elsewhere, unspecified severity, with mood disturbance: Secondary | ICD-10-CM | POA: Diagnosis not present

## 2022-01-18 DIAGNOSIS — G311 Senile degeneration of brain, not elsewhere classified: Secondary | ICD-10-CM | POA: Diagnosis not present

## 2022-01-21 DIAGNOSIS — I10 Essential (primary) hypertension: Secondary | ICD-10-CM | POA: Diagnosis not present

## 2022-01-21 DIAGNOSIS — R296 Repeated falls: Secondary | ICD-10-CM | POA: Diagnosis not present

## 2022-01-21 DIAGNOSIS — Z8781 Personal history of (healed) traumatic fracture: Secondary | ICD-10-CM | POA: Diagnosis not present

## 2022-01-21 DIAGNOSIS — G311 Senile degeneration of brain, not elsewhere classified: Secondary | ICD-10-CM | POA: Diagnosis not present

## 2022-01-21 DIAGNOSIS — F0283 Dementia in other diseases classified elsewhere, unspecified severity, with mood disturbance: Secondary | ICD-10-CM | POA: Diagnosis not present

## 2022-01-22 ENCOUNTER — Emergency Department (HOSPITAL_COMMUNITY)

## 2022-01-22 ENCOUNTER — Inpatient Hospital Stay (HOSPITAL_COMMUNITY)
Admission: EM | Admit: 2022-01-22 | Discharge: 2022-01-25 | DRG: 392 | Disposition: A | Discharge status: Nursing Home (Includes ICF) | Source: Skilled Nursing Facility | Attending: Internal Medicine | Admitting: Internal Medicine

## 2022-01-22 ENCOUNTER — Other Ambulatory Visit: Payer: Self-pay

## 2022-01-22 ENCOUNTER — Encounter (HOSPITAL_COMMUNITY): Payer: Self-pay

## 2022-01-22 DIAGNOSIS — I10 Essential (primary) hypertension: Secondary | ICD-10-CM | POA: Diagnosis present

## 2022-01-22 DIAGNOSIS — G311 Senile degeneration of brain, not elsewhere classified: Secondary | ICD-10-CM | POA: Diagnosis not present

## 2022-01-22 DIAGNOSIS — R296 Repeated falls: Secondary | ICD-10-CM | POA: Diagnosis not present

## 2022-01-22 DIAGNOSIS — M810 Age-related osteoporosis without current pathological fracture: Secondary | ICD-10-CM | POA: Diagnosis present

## 2022-01-22 DIAGNOSIS — R197 Diarrhea, unspecified: Secondary | ICD-10-CM | POA: Diagnosis not present

## 2022-01-22 DIAGNOSIS — K5732 Diverticulitis of large intestine without perforation or abscess without bleeding: Secondary | ICD-10-CM | POA: Diagnosis not present

## 2022-01-22 DIAGNOSIS — F0283 Dementia in other diseases classified elsewhere, unspecified severity, with mood disturbance: Secondary | ICD-10-CM | POA: Diagnosis not present

## 2022-01-22 DIAGNOSIS — Z8249 Family history of ischemic heart disease and other diseases of the circulatory system: Secondary | ICD-10-CM

## 2022-01-22 DIAGNOSIS — N281 Cyst of kidney, acquired: Secondary | ICD-10-CM | POA: Diagnosis not present

## 2022-01-22 DIAGNOSIS — Z833 Family history of diabetes mellitus: Secondary | ICD-10-CM

## 2022-01-22 DIAGNOSIS — R112 Nausea with vomiting, unspecified: Secondary | ICD-10-CM | POA: Diagnosis present

## 2022-01-22 DIAGNOSIS — Z7982 Long term (current) use of aspirin: Secondary | ICD-10-CM

## 2022-01-22 DIAGNOSIS — Z20822 Contact with and (suspected) exposure to covid-19: Secondary | ICD-10-CM | POA: Diagnosis not present

## 2022-01-22 DIAGNOSIS — F0390 Unspecified dementia without behavioral disturbance: Secondary | ICD-10-CM | POA: Diagnosis present

## 2022-01-22 DIAGNOSIS — N390 Urinary tract infection, site not specified: Secondary | ICD-10-CM | POA: Diagnosis present

## 2022-01-22 DIAGNOSIS — K529 Noninfective gastroenteritis and colitis, unspecified: Secondary | ICD-10-CM | POA: Diagnosis present

## 2022-01-22 DIAGNOSIS — R1111 Vomiting without nausea: Secondary | ICD-10-CM | POA: Diagnosis not present

## 2022-01-22 DIAGNOSIS — Z79899 Other long term (current) drug therapy: Secondary | ICD-10-CM

## 2022-01-22 DIAGNOSIS — Z8781 Personal history of (healed) traumatic fracture: Secondary | ICD-10-CM | POA: Diagnosis not present

## 2022-01-22 DIAGNOSIS — K5730 Diverticulosis of large intestine without perforation or abscess without bleeding: Secondary | ICD-10-CM | POA: Diagnosis not present

## 2022-01-22 DIAGNOSIS — Z86011 Personal history of benign neoplasm of the brain: Secondary | ICD-10-CM

## 2022-01-22 DIAGNOSIS — Z66 Do not resuscitate: Secondary | ICD-10-CM | POA: Diagnosis present

## 2022-01-22 DIAGNOSIS — N858 Other specified noninflammatory disorders of uterus: Secondary | ICD-10-CM | POA: Diagnosis not present

## 2022-01-22 DIAGNOSIS — Z981 Arthrodesis status: Secondary | ICD-10-CM

## 2022-01-22 LAB — CBC
HCT: 39.1 % (ref 36.0–46.0)
Hemoglobin: 13.2 g/dL (ref 12.0–15.0)
MCH: 33 pg (ref 26.0–34.0)
MCHC: 33.8 g/dL (ref 30.0–36.0)
MCV: 97.8 fL (ref 80.0–100.0)
Platelets: 429 10*3/uL — ABNORMAL HIGH (ref 150–400)
RBC: 4 MIL/uL (ref 3.87–5.11)
RDW: 14 % (ref 11.5–15.5)
WBC: 27.4 10*3/uL — ABNORMAL HIGH (ref 4.0–10.5)
nRBC: 0 % (ref 0.0–0.2)

## 2022-01-22 LAB — URINALYSIS, ROUTINE W REFLEX MICROSCOPIC
Bilirubin Urine: NEGATIVE
Glucose, UA: NEGATIVE mg/dL
Ketones, ur: NEGATIVE mg/dL
Nitrite: NEGATIVE
Protein, ur: 30 mg/dL — AB
Specific Gravity, Urine: 1.017 (ref 1.005–1.030)
WBC, UA: >50 WBC/hpf — ABNORMAL HIGH (ref 0–5)
pH: 5 (ref 5.0–8.0)

## 2022-01-22 LAB — COMPREHENSIVE METABOLIC PANEL
ALT: 20 U/L (ref 0–44)
AST: 35 U/L (ref 15–41)
Albumin: 3.3 g/dL — ABNORMAL LOW (ref 3.5–5.0)
Alkaline Phosphatase: 59 U/L (ref 38–126)
Anion gap: 12 (ref 5–15)
BUN: 22 mg/dL (ref 8–23)
CO2: 23 mmol/L (ref 22–32)
Calcium: 9.8 mg/dL (ref 8.9–10.3)
Chloride: 103 mmol/L (ref 98–111)
Creatinine, Ser: 0.94 mg/dL (ref 0.44–1.00)
GFR, Estimated: 54 mL/min — ABNORMAL LOW (ref >60)
Glucose, Bld: 168 mg/dL — ABNORMAL HIGH (ref 70–99)
Potassium: 4.1 mmol/L (ref 3.5–5.1)
Sodium: 138 mmol/L (ref 135–145)
Total Bilirubin: 1.4 mg/dL — ABNORMAL HIGH (ref 0.3–1.2)
Total Protein: 6.7 g/dL (ref 6.5–8.1)

## 2022-01-22 LAB — LIPASE, BLOOD: Lipase: 27 U/L (ref 11–51)

## 2022-01-22 MED ORDER — ACETAMINOPHEN 650 MG RE SUPP: 650.0000 mg | Freq: Four times a day (QID) | RECTAL | Status: DC | PRN

## 2022-01-22 MED ORDER — AMLODIPINE BESYLATE 2.5 MG PO TABS
2.5000 mg | ORAL_TABLET | Freq: Every day | ORAL | Status: DC
  Administered 2022-01-23 – 2022-01-25 (×3): 2.5 mg via ORAL
  Filled 2022-01-22 (×3): qty 1

## 2022-01-22 MED ORDER — CIPROFLOXACIN IN D5W 400 MG/200ML IV SOLN
400.0000 mg | Freq: Once | INTRAVENOUS | Status: AC
  Administered 2022-01-22: 400 mg via INTRAVENOUS
  Filled 2022-01-22: qty 200

## 2022-01-22 MED ORDER — METRONIDAZOLE 500 MG/100ML IV SOLN: 500.0000 mg | Freq: Two times a day (BID) | INTRAVENOUS | Status: DC

## 2022-01-22 MED ORDER — DEXTROSE IN LACTATED RINGERS 5 % IV SOLN: INTRAVENOUS | Status: AC

## 2022-01-22 MED ORDER — METRONIDAZOLE 500 MG/100ML IV SOLN
500.0000 mg | Freq: Two times a day (BID) | INTRAVENOUS | Status: DC
  Administered 2022-01-23 – 2022-01-24 (×5): 500 mg via INTRAVENOUS
  Filled 2022-01-22 (×5): qty 100

## 2022-01-22 MED ORDER — ACETAMINOPHEN 325 MG PO TABS: 650.0000 mg | ORAL_TABLET | Freq: Four times a day (QID) | ORAL | Status: DC | PRN

## 2022-01-22 MED ORDER — CIPROFLOXACIN IN D5W 400 MG/200ML IV SOLN: 400.0000 mg | INTRAVENOUS | Status: DC

## 2022-01-22 NOTE — ED Provider Notes (Signed)
?[place] ?Provider Note ? ? ?CSN: [id number] ?Arrival date & time: 01/22/22  1750 ? ?  ? ?History ? ?No chief complaint on file. ? ? ?[name] is a 88 y.o. female presenting from [facility] facility with vomiting today.  Staff reports that the patient vomited once after eating, then had a second very large and reportedly feculent vomiting episode afterwards.  Patient is awake but minimally verbal on arrival, reportedly at her baseline mental status per staff report. ? ?Family discussion by phone with son and daughter - see ed course, confirming patient is on hospice care for general decline in function/mental status - bedbound essentially - DNR/DNI confirmed - no heroic measures in hospital, but family would be comfortable with medical admission for IV antibiotics if needed. ? ?HPI ? ?  ? ?Home Medications ?Prior to Admission medications   ?Medication Sig Start Date End Date Taking? Authorizing Provider  ?acetaminophen (TYLENOL) 650 MG CR tablet Take 650 mg by mouth every 8 (eight) hours as needed for pain.   Yes [provider]  ?amLODipine (NORVASC) 2.5 MG tablet Take 2.5 mg by mouth daily.   Yes [provider]  ?aspirin EC 81 MG tablet Take 81 mg by mouth daily. Swallow whole.   Yes [provider]  ?Calcium Carbonate-Vitamin D 600-400 MG-UNIT tablet Take 1 tablet by mouth 2 (two) times daily.   Yes [provider]  ?clotrimazole (GYNE-LOTRIMIN) 1 % vaginal cream Apply 1 Applicatorful topically 2 (two) times daily as needed (itching/burning). 10/17/21  Yes [provider]  ?docusate sodium (COLACE) 100 MG capsule Take 1 capsule (100 mg total) by mouth daily. Hold if has diarrhea ?Patient taking differently: Take 100 mg by mouth daily as needed for mild constipation. Hold if has diarrhea 08/11/21 08/11/22 Yes Lavina Hamman, MD  ?HYDROCODONE BITARTRATE PO Take 5-10 mg by mouth See admin instructions. 1 tab  every 6 hours as needed for mild pain ?2 tabs every 6 hours as needed for moderate pain   Yes [provider]  ?MIRALAX 17 GM/SCOOP powder Take 8.5 g by mouth daily. 10/17/21  Yes [provider]  ?Multiple Vitamin (MULTIVITAMIN WITH MINERALS) TABS tablet Take 1 tablet by mouth daily.   Yes [provider]  ?promethazine (PHENERGAN) 25 MG tablet Take 12.5 mg by mouth every 6 (six) hours as needed for nausea or vomiting.   Yes [provider]  ?feeding supplement (ENSURE ENLIVE / ENSURE PLUS) LIQD Take 237 mLs by mouth 2 (two) times daily between meals. ?Patient not taking: Reported on 01/22/2022 08/11/21   Lavina Hamman, MD  ?ondansetron (ZOFRAN) 4 MG tablet Take 1 tablet (4 mg total) by mouth every 8 (eight) hours as needed for nausea or vomiting. ?Patient not taking: Reported on 08/09/2021 03/11/20   Hayden Rasmussen, MD  ?   ? ?Allergies    ?Dicloxacillin, Other, Pork-derived products, and Septra [sulfamethoxazole-trimethoprim]   ? ?Review of Systems   ?Review of Systems ? ?Physical Exam ?Updated Vital Signs ?BP 113/66 (BP Location: Left Arm)   Pulse 73   Temp 98.6 ?F (37 ?C) (Oral)   Resp 16   Ht 5' (1.524 m)   Wt 43.1 kg   SpO2 97%   BMI 18.55 kg/m?  ?Physical Exam ?Constitutional:   ?   General: She is not in acute distress. ?HENT:  ?   Head: Normocephalic and atraumatic.  ?Eyes:  ?   Conjunctiva/sclera: Conjunctivae normal.  ?  Pupils: Pupils are equal, round, and reactive to light.  ?Cardiovascular:  ?   Rate and Rhythm: Normal rate and regular rhythm.  ?Pulmonary:  ?   Effort: Pulmonary effort is normal. No respiratory distress.  ?Abdominal:  ?   General: There is no distension.  ?   Tenderness: There is no abdominal tenderness.  ?Skin: ?   General: Skin is warm and dry.  ?Neurological:  ?   Mental Status: She is alert. Mental status is at baseline.  ?Psychiatric:     ?   Mood and Affect: Mood normal.     ?   Behavior: Behavior normal.  ? ? ?ED Results / Procedures /  Treatments   ?Labs ?(all labs ordered are listed, but only abnormal results are displayed) ?Labs Reviewed  ?COMPREHENSIVE METABOLIC PANEL - Abnormal; Notable for the following components:  ?    Result Value  ? Glucose, Bld 168 (*)   ? Albumin 3.3 (*)   ? Total Bilirubin 1.4 (*)   ? GFR, Estimated 54 (*)   ? All other components within normal limits  ?CBC - Abnormal; Notable for the following components:  ? WBC 27.4 (*)   ? Platelets 429 (*)   ? All other components within normal limits  ?URINALYSIS, ROUTINE W REFLEX MICROSCOPIC - Abnormal; Notable for the following components:  ? Color, Urine AMBER (*)   ? APPearance CLOUDY (*)   ? Hgb urine dipstick SMALL (*)   ? Protein, ur 30 (*)   ? Leukocytes,Ua MODERATE (*)   ? WBC, UA >50 (*)   ? Bacteria, UA FEW (*)   ? All other components within normal limits  ?COMPREHENSIVE METABOLIC PANEL - Abnormal; Notable for the following components:  ? Glucose, Bld 133 (*)   ? BUN 24 (*)   ? Creatinine, Ser 1.02 (*)   ? Total Protein 5.8 (*)   ? Albumin 2.9 (*)   ? GFR, Estimated 49 (*)   ? All other components within normal limits  ?CBC - Abnormal; Notable for the following components:  ? WBC 22.4 (*)   ? RBC 3.65 (*)   ? Hemoglobin 11.9 (*)   ? HCT 35.4 (*)   ? All other components within normal limits  ?GLUCOSE, CAPILLARY - Abnormal; Notable for the following components:  ? Glucose-Capillary 117 (*)   ? All other components within normal limits  ?URINE CULTURE  ?LIPASE, BLOOD  ? ? ?EKG ?None ? ?Radiology ?CT ABDOMEN PELVIS WO CONTRAST ? ?Result Date: 01/22/2022 ?CLINICAL DATA:  Nausea/vomiting evaluate for bowel obstruction EXAM: CT ABDOMEN AND PELVIS WITHOUT CONTRAST TECHNIQUE: Multidetector CT imaging of the abdomen and pelvis was performed following the standard protocol without IV contrast. RADIATION DOSE REDUCTION: This exam was performed according to the departmental dose-optimization program which includes automated exposure control, adjustment of the mA and/or kV according  to patient size and/or use of iterative reconstruction technique. COMPARISON:  None. FINDINGS: Lower chest: Calcified coronary arteries and aorta. Dependent bibasilar atelectasis. Hepatobiliary: No focal hepatic abnormality. Gallbladder unremarkable. Pancreas: No focal abnormality or ductal dilatation. Spleen: No focal abnormality.  Normal size. Adrenals/Urinary Tract: Bilateral renal cysts, the largest a left parapelvic cyst measuring 3.3 cm. On the right, the largest cyst measures 3.3 cm in the upper pole. No renal or ureteral stones. No hydronephrosis. Adrenal glands unremarkable. Urinary bladder obscured by beam hardening artifact from bilateral hip hardware. Stomach/Bowel: Sigmoid diverticulosis. Stranding is noted around the distal sigmoid colon in the presacral region. Descending colon  and sigmoid colon appear fluid-filled. This could reflect diverticulitis or sigmoid colitis. No evidence of bowel obstruction. Stomach and small bowel decompressed, grossly unremarkable. Vascular/Lymphatic: Heavily calcified aorta and iliac vessels. No evidence of aneurysm or adenopathy. Reproductive: Calcifications within the uterus, likely calcified fibroids. No adnexal masses. Other: No free fluid or free air. Musculoskeletal: No acute bony abnormality. Postoperative changes in the hips bilaterally. IMPRESSION: Sigmoid diverticulosis. Fluid-filled left colon. Mild stranding around the distal sigmoid colon. Findings could reflect diverticulitis or colitis. Bilateral renal cysts.  No hydronephrosis or stones. Bibasilar atelectasis. Heavily calcified aorta and iliac vessels. Electronically Signed   By: Rolm Baptise M.D.   On: 01/22/2022 20:59  ? ?DG Chest Portable 1 View ? ?Result Date: 01/22/2022 ?CLINICAL DATA:  Evaluate for aspiration. Patient began vomiting fecal matter earlier today as well as having diarrhea. Evaluate for possible aspiration. EXAM: PORTABLE CHEST 1 VIEW COMPARISON:  AP chest 08/09/2021 FINDINGS: Cardiac  silhouette and mediastinal contours are within normal limits with mild calcification within aortic arch. The lungs clear. No pleural effusion or pneumothorax. Cement augmentation of the approximate T12 vertebral

## 2022-01-22 NOTE — H&P (Signed)
?History and Physical  ? ? ?[name] NFA:[id number] DOB: 05/05/1919 DOA: 01/22/2022 ? ?PCP: Gaynelle Arabian, MD  ?Patient coming from: [facility]illed nursing facility. ? ?History obtained from ER physician and patient's nurse.  And also patient's caretaker at the bedside. ? ?Chief Complaint: Nausea vomiting. ? ?HPI: [name] is a 88 y.o. female with advanced dementia, hypertension, meningioma was brought to the ER after patient had nausea and vomiting.  As per the report patient had multiple episodes of vomiting.  Some of the vomitus looked feculent. ? ?ED Course: In the ER CT scan shows features concerning for diverticulitis versus colitis.  Patient had significant leukocytosis and the blood work.  Family has requested DNR but okay with fluids and antibiotics.  Patient was started on Cipro and Flagyl and UA also shows features concerning for UTI.  Patient is mostly nonverbal and does not provide much history. ? ?Review of Systems: As per HPI, rest all negative. ? ? ?Past Medical History:  ?Diagnosis Date  ? Cancer of the skin, basal cell 2008  ? FACE  ? Degenerative joint disease (DJD) of hip   ? Depression   ? Gait disorder   ? Hypertension   ? Hypokalemia   ? Iron deficiency anemia   ? Osteoporosis   ? Pelvic fracture (HCC)   ? ? ?Past Surgical History:  ?Procedure Laterality Date  ? CATARACT EXTRACTION, BILATERAL    ? HIP FRACTURE SURGERY  09/2004  ? DR. Durward Fortes  ? NERVE ENTRAPMENT    ? LEFT THUMB  ? SPINAL FUSION  1996  ? L3-4 [facility]  ? WRIST FRACTURE SURGERY    ? DR. Daylene Katayama  ? ? ? reports that she has never smoked. She has never used smokeless tobacco. She reports that she does not drink alcohol and does not use drugs. ? ?Allergies  ?Allergen Reactions  ? Dicloxacillin Other (See Comments)  ?  Unknown severe allergic reaction  ? Other Other (See Comments)  ?  Unknown reaction to hazel nuts  ? Pork-Derived Products Other (See Comments)  ?  Pt does not eat pork  ? Septra  [Sulfamethoxazole-Trimethoprim] Nausea And Vomiting  ? ? ?Family History  ?Problem Relation Age of Onset  ? Heart failure Mother   ? Hypertension Father   ? Diabetes Sister   ? ? ?Prior to Admission medications   ?Medication Sig Start Date End Date Taking? Authorizing Provider  ?acetaminophen (TYLENOL) 650 MG CR tablet Take 650 mg by mouth every 8 (eight) hours as needed for pain.   Yes [provider]  ?amLODipine (NORVASC) 2.5 MG tablet Take 2.5 mg by mouth daily.   Yes [provider]  ?aspirin EC 81 MG tablet Take 81 mg by mouth daily. Swallow whole.   Yes [provider]  ?Calcium Carbonate-Vitamin D 600-400 MG-UNIT tablet Take 1 tablet by mouth 2 (two) times daily.   Yes [provider]  ?clotrimazole (GYNE-LOTRIMIN) 1 % vaginal cream Apply 1 Applicatorful topically 2 (two) times daily as needed (itching/burning). 10/17/21  Yes [provider]  ?docusate sodium (COLACE) 100 MG capsule Take 1 capsule (100 mg total) by mouth daily. Hold if has diarrhea ?Patient taking differently: Take 100 mg by mouth daily as needed for mild constipation. Hold if has diarrhea 08/11/21 08/11/22 Yes Lavina Hamman, MD  ?HYDROCODONE BITARTRATE PO Take 5-10 mg by mouth See admin instructions. 1 tab every 6 hours as needed for mild pain ?2 tabs every 6 hours as needed for  moderate pain   Yes [provider]  ?MIRALAX 17 GM/SCOOP powder Take 8.5 g by mouth daily. 10/17/21  Yes [provider]  ?Multiple Vitamin (MULTIVITAMIN WITH MINERALS) TABS tablet Take 1 tablet by mouth daily.   Yes [provider]  ?promethazine (PHENERGAN) 25 MG tablet Take 12.5 mg by mouth every 6 (six) hours as needed for nausea or vomiting.   Yes [provider]  ?feeding supplement (ENSURE ENLIVE / ENSURE PLUS) LIQD Take 237 mLs by mouth 2 (two) times daily between meals. ?Patient not taking: Reported on 01/22/2022 08/11/21   Lavina Hamman, MD  ?ondansetron (ZOFRAN) 4 MG tablet  Take 1 tablet (4 mg total) by mouth every 8 (eight) hours as needed for nausea or vomiting. ?Patient not taking: Reported on 08/09/2021 03/11/20   Hayden Rasmussen, MD  ? ? ?Physical Exam: ?Constitutional: Moderately built and poorly nourished. ?Vitals:  ? 01/22/22 1804 01/22/22 1900 01/22/22 2000 01/22/22 2100  ?BP:  (!) 125/59 108/63 132/64  ?Pulse:  77 77 79  ?Resp:  '19 16 19  '$ ?Temp:      ?TempSrc:      ?SpO2:  96% 97% 97%  ?Weight: 43.1 kg     ?Height: 5' (1.524 m)     ? ?Eyes: Anicteric no pallor. ?ENMT: No discharge from the ears eyes nose and mouth. ?Neck: No mass felt.  No neck rigidity. ?Respiratory: No rhonchi or crepitations. ?Cardiovascular: S1-S2 heard. ?Abdomen: Soft nontender bowel sound present. ?Musculoskeletal: No edema. ?Skin: No rash. ?Neurologic: Alert awake patient has advanced dementia does not talk much moving all extremities. ?Psychiatric: Has advanced dementia. ? ? ?Labs on Admission: I have personally reviewed following labs and imaging studies ? ?CBC: ?Recent Labs  ?Lab 01/22/22 ?1820  ?WBC 27.4*  ?HGB 13.2  ?HCT 39.1  ?MCV 97.8  ?PLT 429*  ? ?Basic Metabolic Panel: ?Recent Labs  ?Lab 01/22/22 ?1820  ?NA 138  ?K 4.1  ?CL 103  ?CO2 23  ?GLUCOSE 168*  ?BUN 22  ?CREATININE 0.94  ?CALCIUM 9.8  ? ?GFR: ?Estimated Creatinine Clearance: 21.7 mL/min (by C-G formula based on SCr of 0.94 mg/dL). ?Liver Function Tests: ?Recent Labs  ?Lab 01/22/22 ?1820  ?AST 35  ?ALT 20  ?ALKPHOS 59  ?BILITOT 1.4*  ?PROT 6.7  ?ALBUMIN 3.3*  ? ?Recent Labs  ?Lab 01/22/22 ?1820  ?LIPASE 27  ? ?No results for input(s): AMMONIA in the last 168 hours. ?Coagulation Profile: ?No results for input(s): INR, PROTIME in the last 168 hours. ?Cardiac Enzymes: ?No results for input(s): CKTOTAL, CKMB, CKMBINDEX, TROPONINI in the last 168 hours. ?BNP (last 3 results) ?No results for input(s): PROBNP in the last 8760 hours. ?HbA1C: ?No results for input(s): HGBA1C in the last 72 hours. ?CBG: ?No results for input(s): GLUCAP in the  last 168 hours. ?Lipid Profile: ?No results for input(s): CHOL, HDL, LDLCALC, TRIG, CHOLHDL, LDLDIRECT in the last 72 hours. ?Thyroid Function Tests: ?No results for input(s): TSH, T4TOTAL, FREET4, T3FREE, THYROIDAB in the last 72 hours. ?Anemia Panel: ?No results for input(s): VITAMINB12, FOLATE, FERRITIN, TIBC, IRON, RETICCTPCT in the last 72 hours. ?Urine analysis: ?   ?Component Value Date/Time  ? COLORURINE YELLOW 08/09/2021 1623  ? APPEARANCEUR HAZY (A) 08/09/2021 1623  ? LABSPEC 1.009 08/09/2021 1623  ? PHURINE 8.0 08/09/2021 1623  ? GLUCOSEU NEGATIVE 08/09/2021 1623  ? HGBUR MODERATE (A) 08/09/2021 1623  ? [place] NEGATIVE 08/09/2021 1623  ? [place] NEGATIVE 08/09/2021 1623  ? PROTEINUR NEGATIVE 08/09/2021 1623  ?  UROBILINOGEN 1.0 09/16/2010 1226  ? NITRITE NEGATIVE 08/09/2021 1623  ? LEUKOCYTESUR LARGE (A) 08/09/2021 1623  ? ?Sepsis Labs: ?'@LABRCNTIP'$ (procalcitonin:4,lacticidven:4) ?)No results found for this or any previous visit (from the past 240 hour(s)).  ? ?Radiological Exams on Admission: ?CT ABDOMEN PELVIS WO CONTRAST ? ?Result Date: 01/22/2022 ?CLINICAL DATA:  Nausea/vomiting evaluate for bowel obstruction EXAM: CT ABDOMEN AND PELVIS WITHOUT CONTRAST TECHNIQUE: Multidetector CT imaging of the abdomen and pelvis was performed following the standard protocol without IV contrast. RADIATION DOSE REDUCTION: This exam was performed according to the departmental dose-optimization program which includes automated exposure control, adjustment of the mA and/or kV according to patient size and/or use of iterative reconstruction technique. COMPARISON:  None. FINDINGS: Lower chest: Calcified coronary arteries and aorta. Dependent bibasilar atelectasis. Hepatobiliary: No focal hepatic abnormality. Gallbladder unremarkable. Pancreas: No focal abnormality or ductal dilatation. Spleen: No focal abnormality.  Normal size. Adrenals/Urinary Tract: Bilateral renal cysts, the largest a left parapelvic cyst measuring  3.3 cm. On the right, the largest cyst measures 3.3 cm in the upper pole. No renal or ureteral stones. No hydronephrosis. Adrenal glands unremarkable. Urinary bladder obscured by beam hardening artifact from bilateral hip har

## 2022-01-22 NOTE — ED Notes (Signed)
Patient transported to CT 

## 2022-01-22 NOTE — ED Triage Notes (Signed)
Pt BIB [facility] from [place]. Pt reportedly started vomiting fecal matter and diarrhea today. Pt is at baseline mental status. Pt is alert and oriented to self on arrival.  ? ?EMS Vitals: ? ?116/64 ?HR 83 ?SpO2 94% ?CBG 174 ?

## 2022-01-22 NOTE — ED Notes (Signed)
Attempted to perform I&O, but pt was combative to the procedure at this time.  ?

## 2022-01-22 NOTE — Progress Notes (Signed)
Pharmacy Antibiotic Note ? ?[name] is a 88 y.o. female admitted on 01/22/2022 with  intra-abdominal infection .  Pharmacy has been consulted for cipro dosing. WBC 27.4, afebrile, HR 79 ? ?Plan: ?Cipro '400mg'$  IV q24 hours ? ?Height: 5' (152.4 cm) ?Weight: 43.1 kg (95 lb) ?IBW/kg (Calculated) : 45.5 ? ?Temp (24hrs), Avg:99 ?F (37.2 ?C), Min:99 ?F (37.2 ?C), Max:99 ?F (37.2 ?C) ? ?Recent Labs  ?Lab 01/22/22 ?1820  ?WBC 27.4*  ?CREATININE 0.94  ?  ?Estimated Creatinine Clearance: 21.7 mL/min (by C-G formula based on SCr of 0.94 mg/dL).   ? ?Allergies  ?Allergen Reactions  ? Dicloxacillin Other (See Comments)  ?  Unknown severe allergic reaction  ? Other Other (See Comments)  ?  Unknown reaction to hazel nuts  ? Pork-Derived Products Other (See Comments)  ?  Pt does not eat pork  ? Septra [Sulfamethoxazole-Trimethoprim] Nausea And Vomiting  ? ? ?Antimicrobials this admission: ?Flagyl  04/18 >>  ?Cipro  04/18 >>  ? ?Microbiology results: ?pending ? ?Georga Bora, PharmD ?Clinical Pharmacist ?01/22/2022 10:58 PM ?Please check AMION for all [place] numbers ? ? ?

## 2022-01-23 DIAGNOSIS — F0283 Dementia in other diseases classified elsewhere, unspecified severity, with mood disturbance: Secondary | ICD-10-CM | POA: Diagnosis not present

## 2022-01-23 DIAGNOSIS — Z86011 Personal history of benign neoplasm of the brain: Secondary | ICD-10-CM | POA: Diagnosis not present

## 2022-01-23 DIAGNOSIS — Z66 Do not resuscitate: Secondary | ICD-10-CM | POA: Diagnosis present

## 2022-01-23 DIAGNOSIS — N390 Urinary tract infection, site not specified: Secondary | ICD-10-CM | POA: Diagnosis present

## 2022-01-23 DIAGNOSIS — R112 Nausea with vomiting, unspecified: Secondary | ICD-10-CM | POA: Diagnosis present

## 2022-01-23 DIAGNOSIS — K5732 Diverticulitis of large intestine without perforation or abscess without bleeding: Secondary | ICD-10-CM | POA: Diagnosis present

## 2022-01-23 DIAGNOSIS — Z833 Family history of diabetes mellitus: Secondary | ICD-10-CM | POA: Diagnosis not present

## 2022-01-23 DIAGNOSIS — Z981 Arthrodesis status: Secondary | ICD-10-CM | POA: Diagnosis not present

## 2022-01-23 DIAGNOSIS — M810 Age-related osteoporosis without current pathological fracture: Secondary | ICD-10-CM | POA: Diagnosis present

## 2022-01-23 DIAGNOSIS — Z8781 Personal history of (healed) traumatic fracture: Secondary | ICD-10-CM | POA: Diagnosis not present

## 2022-01-23 DIAGNOSIS — F0390 Unspecified dementia without behavioral disturbance: Secondary | ICD-10-CM | POA: Diagnosis present

## 2022-01-23 DIAGNOSIS — Z79899 Other long term (current) drug therapy: Secondary | ICD-10-CM | POA: Diagnosis not present

## 2022-01-23 DIAGNOSIS — G311 Senile degeneration of brain, not elsewhere classified: Secondary | ICD-10-CM | POA: Diagnosis not present

## 2022-01-23 DIAGNOSIS — I10 Essential (primary) hypertension: Secondary | ICD-10-CM | POA: Diagnosis not present

## 2022-01-23 DIAGNOSIS — R296 Repeated falls: Secondary | ICD-10-CM | POA: Diagnosis not present

## 2022-01-23 DIAGNOSIS — Z7982 Long term (current) use of aspirin: Secondary | ICD-10-CM | POA: Diagnosis not present

## 2022-01-23 DIAGNOSIS — K529 Noninfective gastroenteritis and colitis, unspecified: Secondary | ICD-10-CM | POA: Diagnosis present

## 2022-01-23 DIAGNOSIS — Z8249 Family history of ischemic heart disease and other diseases of the circulatory system: Secondary | ICD-10-CM | POA: Diagnosis not present

## 2022-01-23 LAB — CBC
HCT: 35.4 % — ABNORMAL LOW (ref 36.0–46.0)
Hemoglobin: 11.9 g/dL — ABNORMAL LOW (ref 12.0–15.0)
MCH: 32.6 pg (ref 26.0–34.0)
MCHC: 33.6 g/dL (ref 30.0–36.0)
MCV: 97 fL (ref 80.0–100.0)
Platelets: 379 10*3/uL (ref 150–400)
RBC: 3.65 MIL/uL — ABNORMAL LOW (ref 3.87–5.11)
RDW: 14.1 % (ref 11.5–15.5)
WBC: 22.4 10*3/uL — ABNORMAL HIGH (ref 4.0–10.5)
nRBC: 0 % (ref 0.0–0.2)

## 2022-01-23 LAB — COMPREHENSIVE METABOLIC PANEL
ALT: 18 U/L (ref 0–44)
AST: 28 U/L (ref 15–41)
Albumin: 2.9 g/dL — ABNORMAL LOW (ref 3.5–5.0)
Alkaline Phosphatase: 47 U/L (ref 38–126)
Anion gap: 9 (ref 5–15)
BUN: 24 mg/dL — ABNORMAL HIGH (ref 8–23)
CO2: 23 mmol/L (ref 22–32)
Calcium: 9.2 mg/dL (ref 8.9–10.3)
Chloride: 104 mmol/L (ref 98–111)
Creatinine, Ser: 1.02 mg/dL — ABNORMAL HIGH (ref 0.44–1.00)
GFR, Estimated: 49 mL/min — ABNORMAL LOW (ref >60)
Glucose, Bld: 133 mg/dL — ABNORMAL HIGH (ref 70–99)
Potassium: 4.3 mmol/L (ref 3.5–5.1)
Sodium: 136 mmol/L (ref 135–145)
Total Bilirubin: 1.2 mg/dL (ref 0.3–1.2)
Total Protein: 5.8 g/dL — ABNORMAL LOW (ref 6.5–8.1)

## 2022-01-23 LAB — GLUCOSE, CAPILLARY
Glucose-Capillary: 117 mg/dL — ABNORMAL HIGH (ref 70–99)
Glucose-Capillary: 124 mg/dL — ABNORMAL HIGH (ref 70–99)
Glucose-Capillary: 118 mg/dL — ABNORMAL HIGH (ref 70–99)

## 2022-01-23 MED ORDER — RIVAROXABAN 2.5 MG PO TABS
2.5000 mg | ORAL_TABLET | Freq: Two times a day (BID) | ORAL | Status: DC
  Administered 2022-01-23 – 2022-01-25 (×3): 2.5 mg via ORAL
  Filled 2022-01-23 (×6): qty 1

## 2022-01-23 MED ORDER — SODIUM CHLORIDE 0.9 % IV SOLN
2.0000 g | INTRAVENOUS | Status: DC
  Administered 2022-01-23 – 2022-01-24 (×2): 2 g via INTRAVENOUS
  Filled 2022-01-23 (×2): qty 20

## 2022-01-23 NOTE — Progress Notes (Signed)
?PROGRESS NOTE ? ?[name]  MLY:[id number] DOB: 1919-01-20 DOA: 01/22/2022 ?PCP: Gaynelle Arabian, MD  ? ?Brief Narrative: ? ?Patient is 88 year old female with history of advanced dementia, hypertension, meningioma who was brought to the emergency department from skilled nursing facility with complaints of nausea, vomiting.  There was concern of feculent vomiting.  CT imaging was concerning for sigmoid diverticulitis. Lab work showed leukocytosis.  UA was suspicious for UTI.  Patient was started on antibiotics. ? ?Assessment & Plan: ? ?Principal Problem: ?  Nausea & vomiting ?Active Problems: ?  Essential hypertension ?  Colitis ? ? ?Diverticulitis/colitis: Presented with nausea, vomiting, leukocytosis.  CT imaging was concerning for sigmoid diverticulitis. ?Started on antibiotics.  Her abdomen is benign today.  Started on full liquid diet ? ?Nausea/vomiting: Continue supportive care with antiemetics, IV fluids.  The symptoms have significantly improved. ? ?Concern for UTI: Significant leukocytes in the urine.  Not sure if she has dysuria.  Already on antibiotics.  Follow-up urine culture ? ?History of dementia: Confused at baseline.  Delirium precautions.  Supportive care ? ?History of hypertension: Currently above stable.  Continue current medications ? ?History of meningioma: Currently stable ? ?Goals of care: 88 year old female with above problems.  CODE STATUS DNR.  We will continue to do goals of care conversation with family.  She follows with outpatient hospice ? ?  ? ? ?  ?  ? ?DVT prophylaxis:SCDs Start: 01/22/22 2242 ?rivaroxaban (XARELTO) tablet 2.5 mg  ? ?  Code Status: DNR ? ?Family Communication:: Discussed with daughter and son-in-law on 4/19 on phone ? ?Patient status:Obs ? ?Patient is from :SNF ? ?Anticipated discharge to:SnF ? ?Estimated DC date: 1-2 days ? ? ?Consultants: None ? ?Procedures:None ? ?Antimicrobials:  ?Anti-infectives (From admission, onward)  ? ? Start     Dose/Rate Route  Frequency Ordered Stop  ? 01/23/22 2200  ciprofloxacin (CIPRO) IVPB 400 mg       ? 400 mg ?200 mL/hr over 60 Minutes Intravenous Every 24 hours 01/22/22 2256    ? 01/23/22 0800  metroNIDAZOLE (FLAGYL) IVPB 500 mg  Status:  Discontinued       ? 500 mg ?100 mL/hr over 60 Minutes Intravenous Every 12 hours 01/22/22 2244 01/22/22 2248  ? 01/22/22 2215  ciprofloxacin (CIPRO) IVPB 400 mg       ? 400 mg ?200 mL/hr over 60 Minutes Intravenous  Once 01/22/22 2206 01/23/22 0029  ? 01/22/22 2215  metroNIDAZOLE (FLAGYL) IVPB 500 mg       ? 500 mg ?100 mL/hr over 60 Minutes Intravenous Every 12 hours 01/22/22 2206    ? ?  ? ? ?Subjective: ? ?Patient seen and examined at the bedside this morning.  Hemodynamically stable.  She was without any distress.  She is confused, but not agitated.  Caregiver was at the bedside.  There is no report of worsening abdomen pain, nausea or vomiting. ? ?Objective: ?Vitals:  ? 01/22/22 2310 01/22/22 2330 01/23/22 0000 01/23/22 0157  ?BP: 117/65 (!) 117/103 (!) 114/53 137/60  ?Pulse: 73 80 78 71  ?Resp: '18 18 18 18  '$ ?Temp:    97.7 ?F (36.5 ?C)  ?TempSrc:    Oral  ?SpO2: 95% 96% 96% 98%  ?Weight:      ?Height:      ? ? ?Intake/Output Summary (Last 24 hours) at 01/23/2022 0759 ?Last data filed at 01/23/2022 0413 ?Gross per 24 hour  ?Intake 374.65 ml  ?Output --  ?Net 374.65 ml  ? ?Filed Weights  ?  01/22/22 1804  ?Weight: 43.1 kg  ? ? ?Examination: ? ?General exam: Extremely deconditioned, elderly female lying in bed ?Respiratory system:  no wheezes or crackles  ?Cardiovascular system: S1 & S2 heard, RRR.  ?Gastrointestinal system: Abdomen is nondistended, soft and nontender.  Bowel sounds heard ?Central nervous system: Not alert or oriented ?Extremities: No edema, no clubbing ,no cyanosis ?Skin: No rashes, no ulcers,no icterus   ? ? ?Data Reviewed: I have personally reviewed following labs and imaging studies ? ?CBC: ?Recent Labs  ?Lab 01/22/22 ?1820 01/23/22 ?0124  ?WBC 27.4* 22.4*  ?HGB 13.2 11.9*   ?HCT 39.1 35.4*  ?MCV 97.8 97.0  ?PLT 429* 379  ? ?Basic Metabolic Panel: ?Recent Labs  ?Lab 01/22/22 ?1820 01/23/22 ?0124  ?NA 138 136  ?K 4.1 4.3  ?CL 103 104  ?CO2 23 23  ?GLUCOSE 168* 133*  ?BUN 22 24*  ?CREATININE 0.94 1.02*  ?CALCIUM 9.8 9.2  ? ? ? ?No results found for this or any previous visit (from the past 240 hour(s)).  ? ?Radiology Studies: ?CT ABDOMEN PELVIS WO CONTRAST ? ?Result Date: 01/22/2022 ?CLINICAL DATA:  Nausea/vomiting evaluate for bowel obstruction EXAM: CT ABDOMEN AND PELVIS WITHOUT CONTRAST TECHNIQUE: Multidetector CT imaging of the abdomen and pelvis was performed following the standard protocol without IV contrast. RADIATION DOSE REDUCTION: This exam was performed according to the departmental dose-optimization program which includes automated exposure control, adjustment of the mA and/or kV according to patient size and/or use of iterative reconstruction technique. COMPARISON:  None. FINDINGS: Lower chest: Calcified coronary arteries and aorta. Dependent bibasilar atelectasis. Hepatobiliary: No focal hepatic abnormality. Gallbladder unremarkable. Pancreas: No focal abnormality or ductal dilatation. Spleen: No focal abnormality.  Normal size. Adrenals/Urinary Tract: Bilateral renal cysts, the largest a left parapelvic cyst measuring 3.3 cm. On the right, the largest cyst measures 3.3 cm in the upper pole. No renal or ureteral stones. No hydronephrosis. Adrenal glands unremarkable. Urinary bladder obscured by beam hardening artifact from bilateral hip hardware. Stomach/Bowel: Sigmoid diverticulosis. Stranding is noted around the distal sigmoid colon in the presacral region. Descending colon and sigmoid colon appear fluid-filled. This could reflect diverticulitis or sigmoid colitis. No evidence of bowel obstruction. Stomach and small bowel decompressed, grossly unremarkable. Vascular/Lymphatic: Heavily calcified aorta and iliac vessels. No evidence of aneurysm or adenopathy. Reproductive:  Calcifications within the uterus, likely calcified fibroids. No adnexal masses. Other: No free fluid or free air. Musculoskeletal: No acute bony abnormality. Postoperative changes in the hips bilaterally. IMPRESSION: Sigmoid diverticulosis. Fluid-filled left colon. Mild stranding around the distal sigmoid colon. Findings could reflect diverticulitis or colitis. Bilateral renal cysts.  No hydronephrosis or stones. Bibasilar atelectasis. Heavily calcified aorta and iliac vessels. Electronically Signed   By: Rolm Baptise M.D.   On: 01/22/2022 20:59  ? ?DG Chest Portable 1 View ? ?Result Date: 01/22/2022 ?CLINICAL DATA:  Evaluate for aspiration. Patient began vomiting fecal matter earlier today as well as having diarrhea. Evaluate for possible aspiration. EXAM: PORTABLE CHEST 1 VIEW COMPARISON:  AP chest 08/09/2021 FINDINGS: Cardiac silhouette and mediastinal contours are within normal limits with mild calcification within aortic arch. The lungs clear. No pleural effusion or pneumothorax. Cement augmentation of the approximate T12 vertebral body. IMPRESSION: No acute cardiopulmonary disease process. Electronically Signed   By: Yvonne Kendall M.D.   On: 01/22/2022 19:28   ? ?Scheduled Meds: ? amLODipine  2.5 mg Oral Daily  ? rivaroxaban  2.5 mg Oral BID  ? ?Continuous Infusions: ? ciprofloxacin    ? dextrose 5%  lactated ringers 75 mL/hr at 01/23/22 8478  ? metronidazole Stopped (01/23/22 4128)  ? ? ? LOS: 0 days  ? ?Shelly Coss, MD ?[facility] ?P4/19/2023, 7:59 AM   ?

## 2022-01-23 NOTE — Progress Notes (Addendum)
[id number] [employer] [facility]) Hospital Liaison note.   ?  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 Patient was experiencing nausea and vomiting, EMS was activated and patient was transported to ED for evaluation. Patient was admitted with a diagnosis of nausea/vomiting. Per Dr. Gildardo Cranker with [facility] this is a related hospital admission. ? ?This patient is appropriate for inpatient due to need for symptom management.  ? ?Patient sleeping. No apparent distress. Sitter at bedside states patient symptoms have improved.  ?  ?VS: 98.6, 113/66, 73, 16, 97% RA ?I/O: 374.7/- ? ?Abnormal labs: ?01/23/22 01:24 ?Glucose: 133 (H) ?BUN: 24 (H) ?Creatinine: 1.02 (H) ?Albumin: 2.9 (L) ?Total Protein: 5.8 (L) ?Total Bilirubin: 1.2 ?GFR, Estimated: 49 (L) ?WBC: 22.4 (H) ?RBC: 3.65 (L) ?Hemoglobin: 11.9 (L) ?HCT: 35.4 (L) ? ?Diagnostics: ?CT abdomen ?IMPRESSION: ?Sigmoid diverticulosis. Fluid-filled left colon. Mild stranding ?around the distal sigmoid colon. Findings could reflect ?diverticulitis or colitis. ?Bilateral renal cysts.  No hydronephrosis or stones. ?Bibasilar atelectasis. ?Heavily calcified aorta and iliac vessels. ? ?Problem list: ?Diverticulitis/colitis: Presented with nausea, vomiting, leukocytosis.  CT imaging was concerning for sigmoid diverticulitis. ?Started on antibiotics.  Her abdomen is benign today.  Started on full liquid diet ?  ?Nausea/vomiting: Continue supportive care with antiemetics, IV fluids.  The symptoms have significantly improved. ?  ?Concern for UTI: Significant leukocytes in the urine.  Not sure if she has dysuria.  Already on antibiotics.  Follow-up urine culture ?  ?History of dementia: Confused at baseline.  Delirium precautions.  Supportive care ?  ?History of hypertension: Currently above stable.  Continue current medications ?  ?History of meningioma: Currently stable ?  ?Goals  of care: 88 year old female with above problems.  CODE STATUS DNR.  We will continue to do goals of care conversation with family.  She follows with outpatient hospice ?  ?Discharge Planning: return to [place] with hospice. ?Family Contact:Spoke with daughter by phone.  ?IDG: updated ?GOC: DNR. Return to [place] with hospice when ready for discharge.  ? ?Should patient need ambulance transport at discharge please use [facility] as [facility] contracts this service with them for our active hospice patients. ? ?Please do not hesitate to call with questions.    ? ?Thank you,    ? ?Farrel Gordon, RN, CCM       ? ?[facility] Hospital Liaison    ? ?[phone]  ? ?

## 2022-01-23 NOTE — TOC Initial Note (Signed)
Transition of Care (TOC) - Initial/Assessment Note  ? ? ?Patient Details  ?Name: [name] ?MRN: [id number] ?Date of Birth: 17-Feb-1919 ? ?Transition of Care (TOC) CM/SW Contact:    ?Emeterio Reeve, LCSW ?Phone Number: ?01/23/2022, 4:36 PM ? ?Clinical Narrative:                 ? ?CSW spoke to pts daughter on phone. Daughter [name] stated that pt lives at [facility]. Pt has aides that provide her care. Pt is wheelchair bound and has to have assistance with everything. Family is not interested in SNF. [name] will contact living well to increase aid services to 24 hours a day.  ? ?Expected Discharge Plan: Assisted Living ?Barriers to Discharge: Continued Medical Work up ? ? ?Patient Goals and CMS Choice ?Patient states their goals for this hospitalization and ongoing recovery are:: return to ALF with aides ?  ?  ? ?Expected Discharge Plan and Services ?Expected Discharge Plan: Assisted Living ?  ?  ?  ?Living arrangements for the past 2 months: [place] ?                ?  ?  ?  ?  ?  ?  ?  ?  ?  ?  ? ?Prior Living Arrangements/Services ?Living arrangements for the past 2 months: [place] ?Lives with:: Facility Resident ?Patient language and need for interpreter reviewed:: Yes ?Do you feel safe going back to the place where you live?: Yes      ?Need for Family Participation in Patient Care: Yes (Comment) ?Care giver support system in place?: Yes (comment) ?  ?  ? ?Activities of Daily Living ?  ?  ? ?Permission Sought/Granted ?  ?  ?   ?   ?   ?   ? ?Emotional Assessment ?Appearance:: Appears stated age ?Attitude/Demeanor/Rapport: Unable to Assess ?Affect (typically observed): Unable to Assess ?Orientation: : Oriented to Self ?Alcohol / Substance Use: Not Applicable ?Psych Involvement: No (comment) ? ?Admission diagnosis:  Colitis [K52.9] ?Nausea & vomiting [R11.2] ?Nausea and vomiting [R11.2] ?Patient Active Problem List  ? Diagnosis Date Noted  ? Nausea and vomiting  01/23/2022  ? Nausea & vomiting 01/22/2022  ? Colitis 01/22/2022  ? Meningioma ([place]) 08/09/2021  ? 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/21/2019  ? Normocytic anemia 01/04/2019  ? Leukocytosis 01/02/2019  ? Pubic ramus fracture ([place]) 01/02/2019  ? Hip pain 01/02/2019  ? Essential hypertension 01/02/2019  ? Hypokalemia, inadequate intake 07/09/2017  ? Subdural hematoma ([place]) 07/08/2017  ? Frequent falls 07/08/2017  ? Acute lower UTI 07/08/2017  ? Acute metabolic encephalopathy 88/28/0034  ? Fall   ? ?PCP:  Roetta Sessions, NP ?Pharmacy:   ?[id number] - [place], [address] ?[place] ?[place] [address] ?Phone: [phone] Fax: [phone] ? ?[place], [place] [address] ?[place] [address] ?[address] ?[place] [address] ?Phone: [phone] Fax: [phone] ? ? ? ? ?Social Determinants of Health (SDOH) Interventions ?  ? ?Readmission Risk Interventions ?   ? View : No data to display.  ?  ?  ?  ? ?Emeterio Reeve, LCSW ?Clinical Social Worker ? ? ?

## 2022-01-23 NOTE — Plan of Care (Signed)
  Problem: Activity: Goal: Risk for activity intolerance will decrease Outcome: Progressing   Problem: Coping: Goal: Level of anxiety will decrease Outcome: Progressing   Problem: Pain Managment: Goal: General experience of comfort will improve Outcome: Progressing   Problem: Safety: Goal: Ability to remain free from injury will improve Outcome: Progressing   

## 2022-01-23 NOTE — Plan of Care (Signed)
  Problem: Education: Goal: Knowledge of General Education information will improve Description: Including pain rating scale, medication(s)/side effects and non-pharmacologic comfort measures Outcome: Progressing   Problem: Health Behavior/Discharge Planning: Goal: Ability to manage health-related needs will improve Outcome: Progressing   Problem: Clinical Measurements: Goal: Diagnostic test results will improve Outcome: Progressing   

## 2022-01-23 NOTE — Progress Notes (Signed)
[employer]ufacturing [employer] [facility]) Hospital Liaison note.   ? 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 ? ?[place] liaisons will follow while hospitalized. ? ?Please do not hesitate to call with questions.   ?Thank you,   ?Farrel Gordon, RN, CCM      ?[facility] Hospital Liaison   ?[phone] ?

## 2022-01-24 DIAGNOSIS — R112 Nausea with vomiting, unspecified: Secondary | ICD-10-CM | POA: Diagnosis not present

## 2022-01-24 LAB — CBC
HCT: 31.7 % — ABNORMAL LOW (ref 36.0–46.0)
Hemoglobin: 10.5 g/dL — ABNORMAL LOW (ref 12.0–15.0)
MCH: 32.3 pg (ref 26.0–34.0)
MCHC: 33.1 g/dL (ref 30.0–36.0)
MCV: 97.5 fL (ref 80.0–100.0)
Platelets: 315 10*3/uL (ref 150–400)
RBC: 3.25 MIL/uL — ABNORMAL LOW (ref 3.87–5.11)
RDW: 14.3 % (ref 11.5–15.5)
WBC: 10.5 10*3/uL (ref 4.0–10.5)
nRBC: 0 % (ref 0.0–0.2)

## 2022-01-24 LAB — BASIC METABOLIC PANEL
Anion gap: 5 (ref 5–15)
BUN: 18 mg/dL (ref 8–23)
CO2: 27 mmol/L (ref 22–32)
Calcium: 8.5 mg/dL — ABNORMAL LOW (ref 8.9–10.3)
Chloride: 108 mmol/L (ref 98–111)
Creatinine, Ser: 0.8 mg/dL (ref 0.44–1.00)
GFR, Estimated: >60 mL/min (ref >60)
Glucose, Bld: 97 mg/dL (ref 70–99)
Potassium: 3.9 mmol/L (ref 3.5–5.1)
Sodium: 140 mmol/L (ref 135–145)

## 2022-01-24 LAB — GLUCOSE, CAPILLARY
Glucose-Capillary: 101 mg/dL — ABNORMAL HIGH (ref 70–99)
Glucose-Capillary: 118 mg/dL — ABNORMAL HIGH (ref 70–99)
Glucose-Capillary: 131 mg/dL — ABNORMAL HIGH (ref 70–99)

## 2022-01-24 MED ORDER — SODIUM CHLORIDE 0.9 % IV SOLN
INTRAVENOUS | Status: DC
Start: 2022-01-24 — End: 2022-01-25

## 2022-01-24 NOTE — Progress Notes (Signed)
Attempted to gain IV access X2 and was not successful. ?

## 2022-01-24 NOTE — Plan of Care (Signed)

## 2022-01-24 NOTE — Progress Notes (Signed)
[id number] [employer] engineer [facility]) Hospital Liaison note.   ?  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 Patient was experiencing nausea and vomiting, EMS was activated and patient was transported to ED for evaluation. Patient was admitted with a diagnosis of nausea/vomiting. Per Dr. Gildardo Cranker with [facility] this is a related hospital admission. ?  ?This patient is appropriate for inpatient due to need for symptom management.  ?  ?Awake and alert, confused but pleasant. Ate lunch and is not having any N/V today. Caregiver at bedside ?  ?VS: 98.6, 113/66, 73, 16, 97% RA ?I/O: 1004/- ?  ?Abnormal labs: ?01/24/22 02:54 ?Calcium: 8.5 (L) ?RBC: 3.25 (L) ?Hemoglobin: 10.5 (L) ?HCT: 31.7 (L) ? ?IV/PRN medications: Rocephen 2g IVPB QD, Flagyl '500mg'$  IVPB BID ? ?  ?Problem list: ?Diverticulitis/colitis: Presented with nausea, vomiting, leukocytosis.  CT imaging was concerning for sigmoid diverticulitis. ?Started on antibiotics.  Her abdomen is benign today.  She is tolerating full liquid diet, we might advance the diet to soft today.  Denies any abdominal pain, no nausea or vomiting ?  ?Nausea/vomiting: Continue supportive care with antiemetics, IV fluids.  The symptoms have significantly improved. ?  ?Concern for UTI: Significant leukocytes in the urine.  Not sure if she has dysuria.  Already on antibiotics.  Follow-up urine culture ?  ?History of dementia: Confused at baseline.  Delirium precautions.  Supportive care ?  ?History of hypertension: Currently above stable.  Continue current medications ?  ?History of meningioma: Currently stable ?  ?Goals of care: 88 year old female with above problems.  CODE STATUS DNR.  We will continue to do goals of care conversation with family.  She follows with outpatient hospice ?  ?Discharge Planning: return to [place] with hospice. ?Family Contact:Spoke with daughter by  phone.  ?IDG: updated ?GOC: DNR. Return to [place] with hospice when ready for discharge.  ?  ?Should patient need ambulance transport at discharge please use [facility] as [facility] contracts this service with them for our active hospice patients. ?  ?Please do not hesitate to call with questions.    ?  ?Thank you,    ?  ?Farrel Gordon, RN, CCM       ?  ?[facility] Hospital Liaison    ?  ?[phone]  ?

## 2022-01-24 NOTE — Progress Notes (Signed)
?PROGRESS NOTE ? ?[name]  GQB:[id number] DOB: 10-Jun-1919 DOA: 01/22/2022 ?PCP: Roetta Sessions, NP  ? ?Brief Narrative: ? ?Patient is 88 year old female with history of advanced dementia, hypertension, meningioma who was brought to the emergency department from skilled nursing facility with complaints of nausea, vomiting.  There was concern of feculent vomiting.  CT imaging was concerning for sigmoid diverticulitis. Lab work showed leukocytosis.  UA was suspicious for UTI.  Patient was started on antibiotics.  Overall condition is much better.  We her gradually advancing the diet with plan for discharge back to ALF tomorrow ? ?Assessment & Plan: ? ?Principal Problem: ?  Nausea & vomiting ?Active Problems: ?  Essential hypertension ?  Colitis ?  Nausea and vomiting ? ? ?Diverticulitis/colitis: Presented with nausea, vomiting, leukocytosis.  CT imaging was concerning for sigmoid diverticulitis. ?Started on antibiotics.  Her abdomen is benign today.  She is tolerating full liquid diet, we might advance the diet to soft today.  Denies any abdominal pain, no nausea or vomiting ? ?Nausea/vomiting: Continue supportive care with antiemetics, IV fluids.  The symptoms have significantly improved. ? ?Concern for UTI: Significant leukocytes in the urine.  Not sure if she has dysuria.  Already on antibiotics.  Follow-up urine culture ? ?History of dementia: Confused at baseline.  Delirium precautions.  Supportive care ? ?History of hypertension: Currently above stable.  Continue current medications ? ?History of meningioma: Currently stable ? ?Goals of care: 88 year old female with above problems.  CODE STATUS DNR.  We will continue to do goals of care conversation with family.  She follows with outpatient hospice ? ?  ? ? ?  ?  ? ?DVT prophylaxis:SCDs Start: 01/22/22 2242 ?rivaroxaban (XARELTO) tablet 2.5 mg  ? ?  Code Status: DNR ? ?Family Communication:: Discussed with daughter and son-in-law on 4/19 on  phone ? ?Patient status: Inpatient  ? ?Patient is from : ALF ? ?Anticipated discharge to: ALF ? ?Estimated DC date: Tomorrow ? ? ?Consultants: None ? ?Procedures:None ? ?Antimicrobials:  ?Anti-infectives (From admission, onward)  ? ? Start     Dose/Rate Route Frequency Ordered Stop  ? 01/23/22 2200  ciprofloxacin (CIPRO) IVPB 400 mg  Status:  Discontinued       ? 400 mg ?200 mL/hr over 60 Minutes Intravenous Every 24 hours 01/22/22 2256 01/23/22 1130  ? 01/23/22 1230  cefTRIAXone (ROCEPHIN) 2 g in sodium chloride 0.9 % 100 mL IVPB       ? 2 g ?200 mL/hr over 30 Minutes Intravenous Every 24 hours 01/23/22 1130    ? 01/23/22 0800  metroNIDAZOLE (FLAGYL) IVPB 500 mg  Status:  Discontinued       ? 500 mg ?100 mL/hr over 60 Minutes Intravenous Every 12 hours 01/22/22 2244 01/22/22 2248  ? 01/22/22 2215  ciprofloxacin (CIPRO) IVPB 400 mg       ? 400 mg ?200 mL/hr over 60 Minutes Intravenous  Once 01/22/22 2206 01/23/22 0029  ? 01/22/22 2215  metroNIDAZOLE (FLAGYL) IVPB 500 mg       ? 500 mg ?100 mL/hr over 60 Minutes Intravenous Every 12 hours 01/22/22 2206    ? ?  ? ? ?Subjective: ? ?Patient seen and examined at the bedside this morning.  Hemodynamically stable.  Very comfortable today, more alert and awake than yesterday, tries to communicate.  Caretaker at the bedside.  No report of worsening abdominal pain, nausea or vomiting. ? ?Objective: ?Vitals:  ? 01/23/22 1612 01/23/22 2105 01/24/22 0654 01/24/22 0855  ?BP:  105/78 (!) 173/70 (!) 164/86 (!) 146/68  ?Pulse: 78 89  66  ?Resp: '16 17  18  '$ ?Temp: 97.8 ?F (36.6 ?C)   98.5 ?F (36.9 ?C)  ?TempSrc: Oral   Oral  ?SpO2: 93% 97%  98%  ?Weight:      ?Height:      ? ? ?Intake/Output Summary (Last 24 hours) at 01/24/2022 1030 ?Last data filed at 01/23/2022 2210 ?Gross per 24 hour  ?Intake 1003.96 ml  ?Output --  ?Net 1003.96 ml  ? ?Filed Weights  ? 01/22/22 1804  ?Weight: 43.1 kg  ? ? ?Examination: ? ?General exam: Overall comfortable, not in distress, very deconditioned elderly  female ?HEENT: PERRL ?Respiratory system:  no wheezes or crackles  ?Cardiovascular system: S1 & S2 heard, RRR.  ?Gastrointestinal system: Abdomen is nondistended, soft and nontender. ?Central nervous system: Alert and awake but not oriented ?Extremities: No edema, no clubbing ,no cyanosis ?Skin: No rashes, no ulcers,no icterus   ? ?Data Reviewed: I have personally reviewed following labs and imaging studies ? ?CBC: ?Recent Labs  ?Lab 01/22/22 ?1820 01/23/22 ?0124 01/24/22 ?0254  ?WBC 27.4* 22.4* 10.5  ?HGB 13.2 11.9* 10.5*  ?HCT 39.1 35.4* 31.7*  ?MCV 97.8 97.0 97.5  ?PLT 429* 379 315  ? ?Basic Metabolic Panel: ?Recent Labs  ?Lab 01/22/22 ?1820 01/23/22 ?0124 01/24/22 ?0254  ?NA 138 136 140  ?K 4.1 4.3 3.9  ?CL 103 104 108  ?CO2 '23 23 27  '$ ?GLUCOSE 168* 133* 97  ?BUN 22 24* 18  ?CREATININE 0.94 1.02* 0.80  ?CALCIUM 9.8 9.2 8.5*  ? ? ? ?No results found for this or any previous visit (from the past 240 hour(s)).  ? ?Radiology Studies: ?CT ABDOMEN PELVIS WO CONTRAST ? ?Result Date: 01/22/2022 ?CLINICAL DATA:  Nausea/vomiting evaluate for bowel obstruction EXAM: CT ABDOMEN AND PELVIS WITHOUT CONTRAST TECHNIQUE: Multidetector CT imaging of the abdomen and pelvis was performed following the standard protocol without IV contrast. RADIATION DOSE REDUCTION: This exam was performed according to the departmental dose-optimization program which includes automated exposure control, adjustment of the mA and/or kV according to patient size and/or use of iterative reconstruction technique. COMPARISON:  None. FINDINGS: Lower chest: Calcified coronary arteries and aorta. Dependent bibasilar atelectasis. Hepatobiliary: No focal hepatic abnormality. Gallbladder unremarkable. Pancreas: No focal abnormality or ductal dilatation. Spleen: No focal abnormality.  Normal size. Adrenals/Urinary Tract: Bilateral renal cysts, the largest a left parapelvic cyst measuring 3.3 cm. On the right, the largest cyst measures 3.3 cm in the upper pole. No  renal or ureteral stones. No hydronephrosis. Adrenal glands unremarkable. Urinary bladder obscured by beam hardening artifact from bilateral hip hardware. Stomach/Bowel: Sigmoid diverticulosis. Stranding is noted around the distal sigmoid colon in the presacral region. Descending colon and sigmoid colon appear fluid-filled. This could reflect diverticulitis or sigmoid colitis. No evidence of bowel obstruction. Stomach and small bowel decompressed, grossly unremarkable. Vascular/Lymphatic: Heavily calcified aorta and iliac vessels. No evidence of aneurysm or adenopathy. Reproductive: Calcifications within the uterus, likely calcified fibroids. No adnexal masses. Other: No free fluid or free air. Musculoskeletal: No acute bony abnormality. Postoperative changes in the hips bilaterally. IMPRESSION: Sigmoid diverticulosis. Fluid-filled left colon. Mild stranding around the distal sigmoid colon. Findings could reflect diverticulitis or colitis. Bilateral renal cysts.  No hydronephrosis or stones. Bibasilar atelectasis. Heavily calcified aorta and iliac vessels. Electronically Signed   By: Rolm Baptise M.D.   On: 01/22/2022 20:59   ? ?Scheduled Meds: ? amLODipine  2.5 mg Oral Daily  ? rivaroxaban  2.5 mg Oral BID  ? ?Continuous Infusions: ? cefTRIAXone (ROCEPHIN)  IV Stopped (01/23/22 1241)  ? metronidazole Stopped (01/23/22 2210)  ? ? ? LOS: 1 day  ? ?Shelly Coss, MD ?[facility] ?P4/20/2023, 10:30 AM   ?

## 2022-01-25 DIAGNOSIS — R112 Nausea with vomiting, unspecified: Secondary | ICD-10-CM | POA: Diagnosis not present

## 2022-01-25 LAB — URINE CULTURE: Culture: >=100000 — AB

## 2022-01-25 MED ORDER — CIPROFLOXACIN HCL 500 MG PO TABS: 500.0000 mg | ORAL_TABLET | Freq: Two times a day (BID) | ORAL | 0 refills | Status: AC

## 2022-01-25 MED ORDER — METRONIDAZOLE 500 MG PO TABS: 500.0000 mg | ORAL_TABLET | Freq: Three times a day (TID) | ORAL | 0 refills | Status: AC

## 2022-01-25 NOTE — Discharge Summary (Signed)
Physician Discharge Summary  ?[name] [id number]:[id number] DOB: May 31, 1919 DOA: 01/22/2022 ? ?PCP: Roetta Sessions, NP ? ?Admit date: 01/22/2022 ?Discharge date: 01/25/2022 ? ?Admitted From: ALF ?Disposition:  ALF ? ?Discharge Condition:Stable ?CODE STATUS:DNR ?Diet recommendation: Regular  ? ?Brief/Interim Summary: ? ?Patient is 88 year old female with history of advanced dementia, hypertension, meningioma who was brought to the emergency department from skilled nursing facility with complaints of nausea, vomiting.  There was concern of feculent vomiting.  CT imaging was concerning for sigmoid diverticulitis. Lab work showed leukocytosis.  UA was suspicious for UTI.  Patient was started on antibiotics. Overall condition is much better.  We gradually advanced the diet and she tolerated .She is medically stable for discharge to ALF today. ? ?Following problems were addressed during her hospitalization: ? ?Diverticulitis/colitis: Presented with nausea, vomiting, leukocytosis.  CT imaging was concerning for sigmoid diverticulitis. ?Started on antibiotics.  Her abdomen is benign today.  She is tolerating  soft diet.  Denies any abdominal pain, no nausea or vomiting ?  ?Nausea/vomiting: Treated  with antiemetics, IV fluids.  The symptoms have significantly improved. ?  ?Concern for UTI: Significant leukocytes in the urine.  Not sure if she has dysuria.  Already on antibiotics.  Urine culture showed Pediococcus ?  ?History of dementia: Confused at baseline.  Delirium precautions.  Supportive care ?  ?History of hypertension: Currently  stable.  Continue current medications ?  ?History of meningioma: Currently stable ?  ?Goals of care: 88 year old female with above problems.  CODE STATUS DNR. She follows with outpatient hospice ?  ? ?Discharge Diagnoses:  ?Principal Problem: ?  Nausea & vomiting ?Active Problems: ?  Essential hypertension ?  Colitis ?  Nausea and vomiting ? ? ? ?Discharge Instructions ? ?Discharge  Instructions   ? ? Diet general   Complete by: As directed ?  ? Take soft diet for next 1-2 days  ? Increase activity slowly   Complete by: As directed ?  ? ?  ? ?Allergies as of 01/25/2022   ? ?   Reactions  ? Dicloxacillin Other (See Comments)  ? Unknown severe allergic reaction  ? Other Other (See Comments)  ? Unknown reaction to hazel nuts  ? Pork-derived Products Other (See Comments)  ? Pt does not eat pork  ? Septra [sulfamethoxazole-trimethoprim] Nausea And Vomiting  ? ?  ? ?  ?Medication List  ?  ? ?TAKE these medications   ? ?acetaminophen 650 MG CR tablet ?Commonly known as: TYLENOL ?Take 650 mg by mouth every 8 (eight) hours as needed for pain. ?  ?amLODipine 2.5 MG tablet ?Commonly known as: NORVASC ?Take 2.5 mg by mouth daily. ?  ?aspirin EC 81 MG tablet ?Take 81 mg by mouth daily. Swallow whole. ?  ?Calcium Carbonate-Vitamin D 600-400 MG-UNIT tablet ?Take 1 tablet by mouth 2 (two) times daily. ?  ?ciprofloxacin 500 MG tablet ?Commonly known as: Cipro ?Take 1 tablet (500 mg total) by mouth 2 (two) times daily for 4 days. ?  ?clotrimazole 1 % vaginal cream ?Commonly known as: GYNE-LOTRIMIN ?Apply 1 Applicatorful topically 2 (two) times daily as needed (itching/burning). ?  ?docusate sodium 100 MG capsule ?Commonly known as: Colace ?Take 1 capsule (100 mg total) by mouth daily. Hold if has diarrhea ?What changed:  ?when to take this ?reasons to take this ?  ?feeding supplement Liqd ?Take 237 mLs by mouth 2 (two) times daily between meals. ?  ?HYDROCODONE BITARTRATE PO ?Take 5-10 mg by mouth See admin instructions. 1  tab every 6 hours as needed for mild pain ?2 tabs every 6 hours as needed for moderate pain ?  ?metroNIDAZOLE 500 MG tablet ?Commonly known as: Flagyl ?Take 1 tablet (500 mg total) by mouth 3 (three) times daily for 4 days. ?  ?MiraLax 17 GM/SCOOP powder ?Generic drug: polyethylene glycol powder ?Take 8.5 g by mouth daily. ?  ?multivitamin with minerals Tabs tablet ?Take 1 tablet by mouth  daily. ?  ?ondansetron 4 MG tablet ?Commonly known as: ZOFRAN ?Take 1 tablet (4 mg total) by mouth every 8 (eight) hours as needed for nausea or vomiting. ?  ?promethazine 25 MG tablet ?Commonly known as: PHENERGAN ?Take 12.5 mg by mouth every 6 (six) hours as needed for nausea or vomiting. ?  ? ?  ? ? Follow-up Information   ? ? Roetta Sessions, NP. Schedule an appointment as soon as possible for a visit in 1 week(s).   ?Specialty: Nurse Practitioner ?Contact information: ?[address] ?[place] [address] ?[phone] ? ? ?  ?  ? ?  ?  ? ?  ? ?Allergies  ?Allergen Reactions  ? Dicloxacillin Other (See Comments)  ?  Unknown severe allergic reaction  ? Other Other (See Comments)  ?  Unknown reaction to hazel nuts  ? Pork-Derived Products Other (See Comments)  ?  Pt does not eat pork  ? Septra [Sulfamethoxazole-Trimethoprim] Nausea And Vomiting  ? ? ?Consultations: ?None ? ? ?Procedures/Studies: ?CT ABDOMEN PELVIS WO CONTRAST ? ?Result Date: 01/22/2022 ?CLINICAL DATA:  Nausea/vomiting evaluate for bowel obstruction EXAM: CT ABDOMEN AND PELVIS WITHOUT CONTRAST TECHNIQUE: Multidetector CT imaging of the abdomen and pelvis was performed following the standard protocol without IV contrast. RADIATION DOSE REDUCTION: This exam was performed according to the departmental dose-optimization program which includes automated exposure control, adjustment of the mA and/or kV according to patient size and/or use of iterative reconstruction technique. COMPARISON:  None. FINDINGS: Lower chest: Calcified coronary arteries and aorta. Dependent bibasilar atelectasis. Hepatobiliary: No focal hepatic abnormality. Gallbladder unremarkable. Pancreas: No focal abnormality or ductal dilatation. Spleen: No focal abnormality.  Normal size. Adrenals/Urinary Tract: Bilateral renal cysts, the largest a left parapelvic cyst measuring 3.3 cm. On the right, the largest cyst measures 3.3 cm in the upper pole. No renal or  ureteral stones. No hydronephrosis. Adrenal glands unremarkable. Urinary bladder obscured by beam hardening artifact from bilateral hip hardware. Stomach/Bowel: Sigmoid diverticulosis. Stranding is noted around the distal sigmoid colon in the presacral region. Descending colon and sigmoid colon appear fluid-filled. This could reflect diverticulitis or sigmoid colitis. No evidence of bowel obstruction. Stomach and small bowel decompressed, grossly unremarkable. Vascular/Lymphatic: Heavily calcified aorta and iliac vessels. No evidence of aneurysm or adenopathy. Reproductive: Calcifications within the uterus, likely calcified fibroids. No adnexal masses. Other: No free fluid or free air. Musculoskeletal: No acute bony abnormality. Postoperative changes in the hips bilaterally. IMPRESSION: Sigmoid diverticulosis. Fluid-filled left colon. Mild stranding around the distal sigmoid colon. Findings could reflect diverticulitis or colitis. Bilateral renal cysts.  No hydronephrosis or stones. Bibasilar atelectasis. Heavily calcified aorta and iliac vessels. Electronically Signed   By: Rolm Baptise M.D.   On: 01/22/2022 20:59  ? ?DG Chest Portable 1 View ? ?Result Date: 01/22/2022 ?CLINICAL DATA:  Evaluate for aspiration. Patient began vomiting fecal matter earlier today as well as having diarrhea. Evaluate for possible aspiration. EXAM: PORTABLE CHEST 1 VIEW COMPARISON:  AP chest 08/09/2021 FINDINGS: Cardiac silhouette and mediastinal contours are within normal limits with mild calcification within  aortic arch. The lungs clear. No pleural effusion or pneumothorax. Cement augmentation of the approximate T12 vertebral body. IMPRESSION: No acute cardiopulmonary disease process. Electronically Signed   By: Yvonne Kendall M.D.   On: 01/22/2022 19:28   ? ? ? ?Subjective: ?Patient seen and examined at bedside this morning.  Hemodynamically stable for discharge today.  Denies any nausea, vomiting or abdominal pain. ? ?Discharge  Exam: ?Vitals:  ? 01/24/22 2325 01/25/22 0739  ?BP: 140/68 (!) 181/77  ?Pulse: 67 65  ?Resp:  16  ?Temp: 98.7 ?F (37.1 ?C)   ?SpO2: 97% 99%  ? ?Vitals:  ? 01/24/22 0855 01/24/22 1604 01/24/22 2325 01/25/22 0739  ?BP: (

## 2022-01-25 NOTE — TOC Transition Note (Signed)
Transition of Care (TOC) - CM/SW Discharge Note ? ? ?Patient Details  ?Name: [name] ?MRN: [id number] ?Date of Birth: [birth year] ? ?Transition of Care (TOC) CM/SW Contact:  ?Emeterio Reeve, LCSW ?Phone Number: ?01/25/2022, 3:15 PM ? ? ?Clinical Narrative:    ? ?Per MD patient ready for DC to [facility]. RN, patient, patient's family, and facility notified of DC. Discharge Summary and FL2 sent to facility. DC packet on chart. Ambulance transport requested for patient through [facility]. ?  ?No report needs to be given. ? ?CSW will sign off for now as social work intervention is no longer needed. Please consult [place] again if new needs arise. ? ? ?Final next level of care: Assisted Living ?Barriers to Discharge: Barriers Resolved ? ? ?Patient Goals and CMS Choice ?Patient states their goals for this hospitalization and ongoing recovery are:: return to ALF with aides ?CMS Medicare.gov Compare Post Acute Care list provided to:: Patient ?Choice offered to / list presented to : Patient ? ?Discharge Placement ?  ?           ?Patient chooses bed at: [facility] ?Patient to be transferred to facility by: [facility] ?Name of family member notified: Daughter, [name] ?Patient and family notified of of transfer: 01/25/22 ? ?Discharge Plan and Services ?  ?  ?           ?  ?  ?  ?  ?  ?  ?  ?  ?  ?  ? ?Social Determinants of Health (SDOH) Interventions ?  ? ? ?Readmission Risk Interventions ?   ? View : No data to display.  ?  ?  ?  ? ? ? ? ? ?

## 2022-01-25 NOTE — NC FL2 (Addendum)
?[place] MEDICAID FL2 LEVEL OF CARE SCREENING TOOL  ?  ? ?IDENTIFICATION  ?Patient Name: ?[name] Birthdate: 02/02/1919 Sex: female Admission Date (Current Location): ?01/22/2022  ?[place] and [place] Number: ? [place] ?  Facility and Address:  ?[facility]. [facility], [place] [address], [place], [place] [address] ?     Provider Number: ?[id number]  ?Attending Physician Name and Address:  ?Shelly Coss, MD ? Relative Name and Phone Number:  ?  ?   ?Current Level of Care: ?Hospital Recommended Level of Care: ?Assisted Living Facility Prior Approval Number: ?  ? ?Date Approved/Denied: ?  PASRR Number: ?  ? ?Discharge Plan: ?Other (Comment) (ALF) ?  ? ?Current Diagnoses: ?Patient Active Problem List  ? Diagnosis Date Noted  ? Nausea and vomiting 01/23/2022  ? Nausea & vomiting 01/22/2022  ? Colitis 01/22/2022  ? Meningioma ([place]) 08/09/2021  ? 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/21/2019  ? Normocytic anemia 01/04/2019  ? Leukocytosis 01/02/2019  ? Pubic ramus fracture ([place]) 01/02/2019  ? Hip pain 01/02/2019  ? Essential hypertension 01/02/2019  ? Hypokalemia, inadequate intake 07/09/2017  ? Subdural hematoma ([place]) 07/08/2017  ? Frequent falls 07/08/2017  ? Acute lower UTI 07/08/2017  ? Acute metabolic encephalopathy 81/10/7508  ? Fall   ? ? ?Orientation RESPIRATION BLADDER Height & Weight   ?  ?Self ? Normal Incontinent Weight: 95 lb (43.1 kg) ?Height:  5' (152.4 cm)  ?BEHAVIORAL SYMPTOMS/MOOD NEUROLOGICAL BOWEL NUTRITION STATUS  ?    Incontinent Diet  ?AMBULATORY STATUS COMMUNICATION OF NEEDS Skin   ?  Verbally Normal ?  ?  ?  ?    ?     ?     ? ? ?Personal Care Assistance Level of Assistance  ? (at baseline)   ?  ?  ?   ? ?Functional Limitations Info  ?Sight, Hearing, Speech Sight Info: Adequate ?  ?Speech Info: Adequate  ? ? ?SPECIAL CARE FACTORS FREQUENCY  ?    ?  ?  ?  ?  ?  ?  ?   ? ? ?Contractures Contractures Info: Not present  ? ? ?Additional Factors Info   ?Code Status, Allergies Code Status Info: DNR ?Allergies Info: Other   Pork-derived Products   Septra (Sulfamethoxazole-trimethoprim ?  ?  ?  ?   ? ?Current Medications (01/25/2022):  This is the current hospital active medication list ?Current Facility-Administered Medications  ?Medication Dose Route Frequency Provider Last Rate Last Admin  ? 0.9 %  sodium chloride infusion   Intravenous Continuous Shelly Coss, MD 75 mL/hr at 01/24/22 1102 New Bag at 01/24/22 1102  ? acetaminophen (TYLENOL) tablet 650 mg  650 mg Oral Q6H PRN Rise Patience, MD      ? Or  ? acetaminophen (TYLENOL) suppository 650 mg  650 mg Rectal Q6H PRN Rise Patience, MD      ? amLODipine (NORVASC) tablet 2.5 mg  2.5 mg Oral Daily Rise Patience, MD   2.5 mg at 01/25/22 2585  ? cefTRIAXone (ROCEPHIN) 2 g in sodium chloride 0.9 % 100 mL IVPB  2 g Intravenous Q24H Shelly Coss, MD 200 mL/hr at 01/24/22 1321 2 g at 01/24/22 1321  ? metroNIDAZOLE (FLAGYL) IVPB 500 mg  500 mg Intravenous Q12H Rise Patience, MD 100 mL/hr at 01/24/22 2326 500 mg at 01/24/22 2326  ? rivaroxaban (XARELTO) tablet 2.5 mg  2.5 mg Oral BID Rise Patience, MD   2.5 mg  at 01/25/22 3557  ? ? ? ?Discharge Medications: ?TAKE these medications   ?  ?acetaminophen 650 MG CR tablet ?Commonly known as: TYLENOL ?Take 650 mg by mouth every 8 (eight) hours as needed for pain. ?   ?amLODipine 2.5 MG tablet ?Commonly known as: NORVASC ?Take 2.5 mg by mouth daily. ?   ?aspirin EC 81 MG tablet ?Take 81 mg by mouth daily. Swallow whole. ?   ?Calcium Carbonate-Vitamin D 600-400 MG-UNIT tablet ?Take 1 tablet by mouth 2 (two) times daily. ?   ?ciprofloxacin 500 MG tablet ?Commonly known as: Cipro ?Take 1 tablet (500 mg total) by mouth 2 (two) times daily for 4 days. ?   ?clotrimazole 1 % vaginal cream ?Commonly known as: GYNE-LOTRIMIN ?Apply 1 Applicatorful topically 2 (two) times daily as needed (itching/burning). ?   ?docusate sodium 100 MG  capsule ?Commonly known as: Colace ?Take 1 capsule (100 mg total) by mouth daily. Hold if has diarrhea ?What changed:  ?when to take this ?reasons to take this ?   ?feeding supplement Liqd ?Take 237 mLs by mouth 2 (two) times daily between meals. ?   ?HYDROCODONE BITARTRATE PO ?Take 5-10 mg by mouth See admin instructions. 1 tab every 6 hours as needed for mild pain ?2 tabs every 6 hours as needed for moderate pain ?   ?metroNIDAZOLE 500 MG tablet ?Commonly known as: Flagyl ?Take 1 tablet (500 mg total) by mouth 3 (three) times daily for 4 days. ?   ?MiraLax 17 GM/SCOOP powder ?Generic drug: polyethylene glycol powder ?Take 8.5 g by mouth daily. ?   ?multivitamin with minerals Tabs tablet ?Take 1 tablet by mouth daily. ?   ?ondansetron 4 MG tablet ?Commonly known as: ZOFRAN ?Take 1 tablet (4 mg total) by mouth every 8 (eight) hours as needed for nausea or vomiting. ?   ?promethazine 25 MG tablet ?Commonly known as: PHENERGAN ?Take 12.5 mg by mouth every 6 (six) hours as needed for nausea or vomiting. ?   ? ? ?Relevant Imaging Results: ? ?Relevant Lab Results: ? ? ?Additional Information ?SSN: [id number] ? ?Emeterio Reeve, LCSW ? ? ? ? ?

## 2022-01-26 DIAGNOSIS — R296 Repeated falls: Secondary | ICD-10-CM | POA: Diagnosis not present

## 2022-01-26 DIAGNOSIS — G311 Senile degeneration of brain, not elsewhere classified: Secondary | ICD-10-CM | POA: Diagnosis not present

## 2022-01-26 DIAGNOSIS — I10 Essential (primary) hypertension: Secondary | ICD-10-CM | POA: Diagnosis not present

## 2022-01-26 DIAGNOSIS — F0283 Dementia in other diseases classified elsewhere, unspecified severity, with mood disturbance: Secondary | ICD-10-CM | POA: Diagnosis not present

## 2022-01-26 DIAGNOSIS — Z8781 Personal history of (healed) traumatic fracture: Secondary | ICD-10-CM | POA: Diagnosis not present

## 2022-01-28 DIAGNOSIS — F0283 Dementia in other diseases classified elsewhere, unspecified severity, with mood disturbance: Secondary | ICD-10-CM | POA: Diagnosis not present

## 2022-01-28 DIAGNOSIS — I10 Essential (primary) hypertension: Secondary | ICD-10-CM | POA: Diagnosis not present

## 2022-01-28 DIAGNOSIS — Z8781 Personal history of (healed) traumatic fracture: Secondary | ICD-10-CM | POA: Diagnosis not present

## 2022-01-28 DIAGNOSIS — R296 Repeated falls: Secondary | ICD-10-CM | POA: Diagnosis not present

## 2022-01-28 DIAGNOSIS — G311 Senile degeneration of brain, not elsewhere classified: Secondary | ICD-10-CM | POA: Diagnosis not present

## 2022-01-29 DIAGNOSIS — Z8781 Personal history of (healed) traumatic fracture: Secondary | ICD-10-CM | POA: Diagnosis not present

## 2022-01-29 DIAGNOSIS — R296 Repeated falls: Secondary | ICD-10-CM | POA: Diagnosis not present

## 2022-01-29 DIAGNOSIS — I10 Essential (primary) hypertension: Secondary | ICD-10-CM | POA: Diagnosis not present

## 2022-01-29 DIAGNOSIS — F0283 Dementia in other diseases classified elsewhere, unspecified severity, with mood disturbance: Secondary | ICD-10-CM | POA: Diagnosis not present

## 2022-01-29 DIAGNOSIS — G311 Senile degeneration of brain, not elsewhere classified: Secondary | ICD-10-CM | POA: Diagnosis not present

## 2022-01-30 DIAGNOSIS — G311 Senile degeneration of brain, not elsewhere classified: Secondary | ICD-10-CM | POA: Diagnosis not present

## 2022-01-30 DIAGNOSIS — F0283 Dementia in other diseases classified elsewhere, unspecified severity, with mood disturbance: Secondary | ICD-10-CM | POA: Diagnosis not present

## 2022-01-30 DIAGNOSIS — R296 Repeated falls: Secondary | ICD-10-CM | POA: Diagnosis not present

## 2022-01-30 DIAGNOSIS — Z8781 Personal history of (healed) traumatic fracture: Secondary | ICD-10-CM | POA: Diagnosis not present

## 2022-01-30 DIAGNOSIS — I10 Essential (primary) hypertension: Secondary | ICD-10-CM | POA: Diagnosis not present

## 2022-02-01 DIAGNOSIS — G311 Senile degeneration of brain, not elsewhere classified: Secondary | ICD-10-CM | POA: Diagnosis not present

## 2022-02-01 DIAGNOSIS — F0283 Dementia in other diseases classified elsewhere, unspecified severity, with mood disturbance: Secondary | ICD-10-CM | POA: Diagnosis not present

## 2022-02-01 DIAGNOSIS — Z8781 Personal history of (healed) traumatic fracture: Secondary | ICD-10-CM | POA: Diagnosis not present

## 2022-02-01 DIAGNOSIS — R296 Repeated falls: Secondary | ICD-10-CM | POA: Diagnosis not present

## 2022-02-01 DIAGNOSIS — I10 Essential (primary) hypertension: Secondary | ICD-10-CM | POA: Diagnosis not present

## 2022-02-04 DIAGNOSIS — F0283 Dementia in other diseases classified elsewhere, unspecified severity, with mood disturbance: Secondary | ICD-10-CM | POA: Diagnosis not present

## 2022-02-04 DIAGNOSIS — Z8781 Personal history of (healed) traumatic fracture: Secondary | ICD-10-CM | POA: Diagnosis not present

## 2022-02-04 DIAGNOSIS — G311 Senile degeneration of brain, not elsewhere classified: Secondary | ICD-10-CM | POA: Diagnosis not present

## 2022-02-04 DIAGNOSIS — I10 Essential (primary) hypertension: Secondary | ICD-10-CM | POA: Diagnosis not present

## 2022-02-04 DIAGNOSIS — Z20822 Contact with and (suspected) exposure to covid-19: Secondary | ICD-10-CM | POA: Diagnosis not present

## 2022-02-04 DIAGNOSIS — R296 Repeated falls: Secondary | ICD-10-CM | POA: Diagnosis not present

## 2022-02-06 DIAGNOSIS — F0283 Dementia in other diseases classified elsewhere, unspecified severity, with mood disturbance: Secondary | ICD-10-CM | POA: Diagnosis not present

## 2022-02-06 DIAGNOSIS — Z8781 Personal history of (healed) traumatic fracture: Secondary | ICD-10-CM | POA: Diagnosis not present

## 2022-02-06 DIAGNOSIS — G311 Senile degeneration of brain, not elsewhere classified: Secondary | ICD-10-CM | POA: Diagnosis not present

## 2022-02-06 DIAGNOSIS — R296 Repeated falls: Secondary | ICD-10-CM | POA: Diagnosis not present

## 2022-02-06 DIAGNOSIS — I10 Essential (primary) hypertension: Secondary | ICD-10-CM | POA: Diagnosis not present

## 2022-02-07 DIAGNOSIS — K5901 Slow transit constipation: Secondary | ICD-10-CM | POA: Diagnosis not present

## 2022-02-07 DIAGNOSIS — F0390 Unspecified dementia without behavioral disturbance: Secondary | ICD-10-CM | POA: Diagnosis not present

## 2022-02-07 DIAGNOSIS — I10 Essential (primary) hypertension: Secondary | ICD-10-CM | POA: Diagnosis not present

## 2022-02-08 DIAGNOSIS — R296 Repeated falls: Secondary | ICD-10-CM | POA: Diagnosis not present

## 2022-02-08 DIAGNOSIS — F0283 Dementia in other diseases classified elsewhere, unspecified severity, with mood disturbance: Secondary | ICD-10-CM | POA: Diagnosis not present

## 2022-02-08 DIAGNOSIS — G311 Senile degeneration of brain, not elsewhere classified: Secondary | ICD-10-CM | POA: Diagnosis not present

## 2022-02-08 DIAGNOSIS — I10 Essential (primary) hypertension: Secondary | ICD-10-CM | POA: Diagnosis not present

## 2022-02-08 DIAGNOSIS — Z8781 Personal history of (healed) traumatic fracture: Secondary | ICD-10-CM | POA: Diagnosis not present

## 2022-02-11 DIAGNOSIS — F0283 Dementia in other diseases classified elsewhere, unspecified severity, with mood disturbance: Secondary | ICD-10-CM | POA: Diagnosis not present

## 2022-02-11 DIAGNOSIS — I10 Essential (primary) hypertension: Secondary | ICD-10-CM | POA: Diagnosis not present

## 2022-02-11 DIAGNOSIS — Z8781 Personal history of (healed) traumatic fracture: Secondary | ICD-10-CM | POA: Diagnosis not present

## 2022-02-11 DIAGNOSIS — G311 Senile degeneration of brain, not elsewhere classified: Secondary | ICD-10-CM | POA: Diagnosis not present

## 2022-02-11 DIAGNOSIS — R296 Repeated falls: Secondary | ICD-10-CM | POA: Diagnosis not present

## 2022-02-12 DIAGNOSIS — I158 Other secondary hypertension: Secondary | ICD-10-CM | POA: Diagnosis not present

## 2022-02-12 DIAGNOSIS — L98411 Non-pressure chronic ulcer of buttock limited to breakdown of skin: Secondary | ICD-10-CM | POA: Diagnosis not present

## 2022-02-13 DIAGNOSIS — I10 Essential (primary) hypertension: Secondary | ICD-10-CM | POA: Diagnosis not present

## 2022-02-13 DIAGNOSIS — Z8781 Personal history of (healed) traumatic fracture: Secondary | ICD-10-CM | POA: Diagnosis not present

## 2022-02-13 DIAGNOSIS — G311 Senile degeneration of brain, not elsewhere classified: Secondary | ICD-10-CM | POA: Diagnosis not present

## 2022-02-13 DIAGNOSIS — F0283 Dementia in other diseases classified elsewhere, unspecified severity, with mood disturbance: Secondary | ICD-10-CM | POA: Diagnosis not present

## 2022-02-13 DIAGNOSIS — R296 Repeated falls: Secondary | ICD-10-CM | POA: Diagnosis not present

## 2022-02-15 DIAGNOSIS — Z8781 Personal history of (healed) traumatic fracture: Secondary | ICD-10-CM | POA: Diagnosis not present

## 2022-02-15 DIAGNOSIS — F0283 Dementia in other diseases classified elsewhere, unspecified severity, with mood disturbance: Secondary | ICD-10-CM | POA: Diagnosis not present

## 2022-02-15 DIAGNOSIS — G311 Senile degeneration of brain, not elsewhere classified: Secondary | ICD-10-CM | POA: Diagnosis not present

## 2022-02-15 DIAGNOSIS — I10 Essential (primary) hypertension: Secondary | ICD-10-CM | POA: Diagnosis not present

## 2022-02-15 DIAGNOSIS — R296 Repeated falls: Secondary | ICD-10-CM | POA: Diagnosis not present

## 2022-02-18 DIAGNOSIS — F0283 Dementia in other diseases classified elsewhere, unspecified severity, with mood disturbance: Secondary | ICD-10-CM | POA: Diagnosis not present

## 2022-02-18 DIAGNOSIS — R296 Repeated falls: Secondary | ICD-10-CM | POA: Diagnosis not present

## 2022-02-18 DIAGNOSIS — I10 Essential (primary) hypertension: Secondary | ICD-10-CM | POA: Diagnosis not present

## 2022-02-18 DIAGNOSIS — Z8781 Personal history of (healed) traumatic fracture: Secondary | ICD-10-CM | POA: Diagnosis not present

## 2022-02-18 DIAGNOSIS — G311 Senile degeneration of brain, not elsewhere classified: Secondary | ICD-10-CM | POA: Diagnosis not present

## 2022-02-20 DIAGNOSIS — R296 Repeated falls: Secondary | ICD-10-CM | POA: Diagnosis not present

## 2022-02-20 DIAGNOSIS — F0283 Dementia in other diseases classified elsewhere, unspecified severity, with mood disturbance: Secondary | ICD-10-CM | POA: Diagnosis not present

## 2022-02-20 DIAGNOSIS — Z8781 Personal history of (healed) traumatic fracture: Secondary | ICD-10-CM | POA: Diagnosis not present

## 2022-02-20 DIAGNOSIS — G311 Senile degeneration of brain, not elsewhere classified: Secondary | ICD-10-CM | POA: Diagnosis not present

## 2022-02-20 DIAGNOSIS — I10 Essential (primary) hypertension: Secondary | ICD-10-CM | POA: Diagnosis not present

## 2022-02-22 DIAGNOSIS — G311 Senile degeneration of brain, not elsewhere classified: Secondary | ICD-10-CM | POA: Diagnosis not present

## 2022-02-22 DIAGNOSIS — I10 Essential (primary) hypertension: Secondary | ICD-10-CM | POA: Diagnosis not present

## 2022-02-22 DIAGNOSIS — Z8781 Personal history of (healed) traumatic fracture: Secondary | ICD-10-CM | POA: Diagnosis not present

## 2022-02-22 DIAGNOSIS — F0283 Dementia in other diseases classified elsewhere, unspecified severity, with mood disturbance: Secondary | ICD-10-CM | POA: Diagnosis not present

## 2022-02-22 DIAGNOSIS — R296 Repeated falls: Secondary | ICD-10-CM | POA: Diagnosis not present

## 2022-02-25 DIAGNOSIS — R296 Repeated falls: Secondary | ICD-10-CM | POA: Diagnosis not present

## 2022-02-25 DIAGNOSIS — Z87440 Personal history of urinary (tract) infections: Secondary | ICD-10-CM | POA: Diagnosis not present

## 2022-02-25 DIAGNOSIS — F0283 Dementia in other diseases classified elsewhere, unspecified severity, with mood disturbance: Secondary | ICD-10-CM | POA: Diagnosis not present

## 2022-02-25 DIAGNOSIS — K5901 Slow transit constipation: Secondary | ICD-10-CM | POA: Diagnosis not present

## 2022-02-25 DIAGNOSIS — Z9181 History of falling: Secondary | ICD-10-CM | POA: Diagnosis not present

## 2022-02-25 DIAGNOSIS — G311 Senile degeneration of brain, not elsewhere classified: Secondary | ICD-10-CM | POA: Diagnosis not present

## 2022-02-25 DIAGNOSIS — I10 Essential (primary) hypertension: Secondary | ICD-10-CM | POA: Diagnosis not present

## 2022-02-25 DIAGNOSIS — Z8781 Personal history of (healed) traumatic fracture: Secondary | ICD-10-CM | POA: Diagnosis not present

## 2022-02-27 DIAGNOSIS — Z8781 Personal history of (healed) traumatic fracture: Secondary | ICD-10-CM | POA: Diagnosis not present

## 2022-02-27 DIAGNOSIS — G311 Senile degeneration of brain, not elsewhere classified: Secondary | ICD-10-CM | POA: Diagnosis not present

## 2022-02-27 DIAGNOSIS — I10 Essential (primary) hypertension: Secondary | ICD-10-CM | POA: Diagnosis not present

## 2022-02-27 DIAGNOSIS — R296 Repeated falls: Secondary | ICD-10-CM | POA: Diagnosis not present

## 2022-02-27 DIAGNOSIS — F0283 Dementia in other diseases classified elsewhere, unspecified severity, with mood disturbance: Secondary | ICD-10-CM | POA: Diagnosis not present

## 2022-03-01 DIAGNOSIS — G311 Senile degeneration of brain, not elsewhere classified: Secondary | ICD-10-CM | POA: Diagnosis not present

## 2022-03-01 DIAGNOSIS — R296 Repeated falls: Secondary | ICD-10-CM | POA: Diagnosis not present

## 2022-03-01 DIAGNOSIS — Z8781 Personal history of (healed) traumatic fracture: Secondary | ICD-10-CM | POA: Diagnosis not present

## 2022-03-01 DIAGNOSIS — F0283 Dementia in other diseases classified elsewhere, unspecified severity, with mood disturbance: Secondary | ICD-10-CM | POA: Diagnosis not present

## 2022-03-01 DIAGNOSIS — I10 Essential (primary) hypertension: Secondary | ICD-10-CM | POA: Diagnosis not present

## 2022-03-04 DIAGNOSIS — F0283 Dementia in other diseases classified elsewhere, unspecified severity, with mood disturbance: Secondary | ICD-10-CM | POA: Diagnosis not present

## 2022-03-04 DIAGNOSIS — Z8781 Personal history of (healed) traumatic fracture: Secondary | ICD-10-CM | POA: Diagnosis not present

## 2022-03-04 DIAGNOSIS — I10 Essential (primary) hypertension: Secondary | ICD-10-CM | POA: Diagnosis not present

## 2022-03-04 DIAGNOSIS — G311 Senile degeneration of brain, not elsewhere classified: Secondary | ICD-10-CM | POA: Diagnosis not present

## 2022-03-04 DIAGNOSIS — R296 Repeated falls: Secondary | ICD-10-CM | POA: Diagnosis not present

## 2022-03-06 DIAGNOSIS — F0283 Dementia in other diseases classified elsewhere, unspecified severity, with mood disturbance: Secondary | ICD-10-CM | POA: Diagnosis not present

## 2022-03-06 DIAGNOSIS — Z8781 Personal history of (healed) traumatic fracture: Secondary | ICD-10-CM | POA: Diagnosis not present

## 2022-03-06 DIAGNOSIS — R296 Repeated falls: Secondary | ICD-10-CM | POA: Diagnosis not present

## 2022-03-06 DIAGNOSIS — G311 Senile degeneration of brain, not elsewhere classified: Secondary | ICD-10-CM | POA: Diagnosis not present

## 2022-03-06 DIAGNOSIS — I10 Essential (primary) hypertension: Secondary | ICD-10-CM | POA: Diagnosis not present

## 2022-03-07 DIAGNOSIS — R159 Full incontinence of feces: Secondary | ICD-10-CM | POA: Diagnosis not present

## 2022-03-07 DIAGNOSIS — Z8781 Personal history of (healed) traumatic fracture: Secondary | ICD-10-CM | POA: Diagnosis not present

## 2022-03-07 DIAGNOSIS — F0283 Dementia in other diseases classified elsewhere, unspecified severity, with mood disturbance: Secondary | ICD-10-CM | POA: Diagnosis not present

## 2022-03-07 DIAGNOSIS — R296 Repeated falls: Secondary | ICD-10-CM | POA: Diagnosis not present

## 2022-03-07 DIAGNOSIS — R32 Unspecified urinary incontinence: Secondary | ICD-10-CM | POA: Diagnosis not present

## 2022-03-07 DIAGNOSIS — Z6820 Body mass index (BMI) 20.0-20.9, adult: Secondary | ICD-10-CM | POA: Diagnosis not present

## 2022-03-07 DIAGNOSIS — I10 Essential (primary) hypertension: Secondary | ICD-10-CM | POA: Diagnosis not present

## 2022-03-07 DIAGNOSIS — Z741 Need for assistance with personal care: Secondary | ICD-10-CM | POA: Diagnosis not present

## 2022-03-07 DIAGNOSIS — G311 Senile degeneration of brain, not elsewhere classified: Secondary | ICD-10-CM | POA: Diagnosis not present

## 2022-03-08 DIAGNOSIS — G311 Senile degeneration of brain, not elsewhere classified: Secondary | ICD-10-CM | POA: Diagnosis not present

## 2022-03-08 DIAGNOSIS — I10 Essential (primary) hypertension: Secondary | ICD-10-CM | POA: Diagnosis not present

## 2022-03-08 DIAGNOSIS — R32 Unspecified urinary incontinence: Secondary | ICD-10-CM | POA: Diagnosis not present

## 2022-03-08 DIAGNOSIS — R159 Full incontinence of feces: Secondary | ICD-10-CM | POA: Diagnosis not present

## 2022-03-08 DIAGNOSIS — R296 Repeated falls: Secondary | ICD-10-CM | POA: Diagnosis not present

## 2022-03-08 DIAGNOSIS — F0283 Dementia in other diseases classified elsewhere, unspecified severity, with mood disturbance: Secondary | ICD-10-CM | POA: Diagnosis not present

## 2022-03-11 DIAGNOSIS — R32 Unspecified urinary incontinence: Secondary | ICD-10-CM | POA: Diagnosis not present

## 2022-03-11 DIAGNOSIS — R159 Full incontinence of feces: Secondary | ICD-10-CM | POA: Diagnosis not present

## 2022-03-11 DIAGNOSIS — G311 Senile degeneration of brain, not elsewhere classified: Secondary | ICD-10-CM | POA: Diagnosis not present

## 2022-03-11 DIAGNOSIS — F0283 Dementia in other diseases classified elsewhere, unspecified severity, with mood disturbance: Secondary | ICD-10-CM | POA: Diagnosis not present

## 2022-03-11 DIAGNOSIS — R296 Repeated falls: Secondary | ICD-10-CM | POA: Diagnosis not present

## 2022-03-11 DIAGNOSIS — I10 Essential (primary) hypertension: Secondary | ICD-10-CM | POA: Diagnosis not present

## 2022-03-13 DIAGNOSIS — R159 Full incontinence of feces: Secondary | ICD-10-CM | POA: Diagnosis not present

## 2022-03-13 DIAGNOSIS — F0283 Dementia in other diseases classified elsewhere, unspecified severity, with mood disturbance: Secondary | ICD-10-CM | POA: Diagnosis not present

## 2022-03-13 DIAGNOSIS — I10 Essential (primary) hypertension: Secondary | ICD-10-CM | POA: Diagnosis not present

## 2022-03-13 DIAGNOSIS — G311 Senile degeneration of brain, not elsewhere classified: Secondary | ICD-10-CM | POA: Diagnosis not present

## 2022-03-13 DIAGNOSIS — R296 Repeated falls: Secondary | ICD-10-CM | POA: Diagnosis not present

## 2022-03-13 DIAGNOSIS — R32 Unspecified urinary incontinence: Secondary | ICD-10-CM | POA: Diagnosis not present

## 2022-03-15 DIAGNOSIS — R32 Unspecified urinary incontinence: Secondary | ICD-10-CM | POA: Diagnosis not present

## 2022-03-15 DIAGNOSIS — R159 Full incontinence of feces: Secondary | ICD-10-CM | POA: Diagnosis not present

## 2022-03-15 DIAGNOSIS — F0283 Dementia in other diseases classified elsewhere, unspecified severity, with mood disturbance: Secondary | ICD-10-CM | POA: Diagnosis not present

## 2022-03-15 DIAGNOSIS — R296 Repeated falls: Secondary | ICD-10-CM | POA: Diagnosis not present

## 2022-03-15 DIAGNOSIS — G311 Senile degeneration of brain, not elsewhere classified: Secondary | ICD-10-CM | POA: Diagnosis not present

## 2022-03-15 DIAGNOSIS — I10 Essential (primary) hypertension: Secondary | ICD-10-CM | POA: Diagnosis not present

## 2022-03-18 DIAGNOSIS — R296 Repeated falls: Secondary | ICD-10-CM | POA: Diagnosis not present

## 2022-03-18 DIAGNOSIS — G311 Senile degeneration of brain, not elsewhere classified: Secondary | ICD-10-CM | POA: Diagnosis not present

## 2022-03-18 DIAGNOSIS — R32 Unspecified urinary incontinence: Secondary | ICD-10-CM | POA: Diagnosis not present

## 2022-03-18 DIAGNOSIS — I10 Essential (primary) hypertension: Secondary | ICD-10-CM | POA: Diagnosis not present

## 2022-03-18 DIAGNOSIS — R159 Full incontinence of feces: Secondary | ICD-10-CM | POA: Diagnosis not present

## 2022-03-18 DIAGNOSIS — F0283 Dementia in other diseases classified elsewhere, unspecified severity, with mood disturbance: Secondary | ICD-10-CM | POA: Diagnosis not present

## 2022-03-20 DIAGNOSIS — R32 Unspecified urinary incontinence: Secondary | ICD-10-CM | POA: Diagnosis not present

## 2022-03-20 DIAGNOSIS — R159 Full incontinence of feces: Secondary | ICD-10-CM | POA: Diagnosis not present

## 2022-03-20 DIAGNOSIS — G311 Senile degeneration of brain, not elsewhere classified: Secondary | ICD-10-CM | POA: Diagnosis not present

## 2022-03-20 DIAGNOSIS — R296 Repeated falls: Secondary | ICD-10-CM | POA: Diagnosis not present

## 2022-03-20 DIAGNOSIS — F0283 Dementia in other diseases classified elsewhere, unspecified severity, with mood disturbance: Secondary | ICD-10-CM | POA: Diagnosis not present

## 2022-03-20 DIAGNOSIS — I10 Essential (primary) hypertension: Secondary | ICD-10-CM | POA: Diagnosis not present

## 2022-03-22 DIAGNOSIS — R32 Unspecified urinary incontinence: Secondary | ICD-10-CM | POA: Diagnosis not present

## 2022-03-22 DIAGNOSIS — R296 Repeated falls: Secondary | ICD-10-CM | POA: Diagnosis not present

## 2022-03-22 DIAGNOSIS — F0283 Dementia in other diseases classified elsewhere, unspecified severity, with mood disturbance: Secondary | ICD-10-CM | POA: Diagnosis not present

## 2022-03-22 DIAGNOSIS — R159 Full incontinence of feces: Secondary | ICD-10-CM | POA: Diagnosis not present

## 2022-03-22 DIAGNOSIS — G311 Senile degeneration of brain, not elsewhere classified: Secondary | ICD-10-CM | POA: Diagnosis not present

## 2022-03-22 DIAGNOSIS — I10 Essential (primary) hypertension: Secondary | ICD-10-CM | POA: Diagnosis not present

## 2022-03-25 DIAGNOSIS — R296 Repeated falls: Secondary | ICD-10-CM | POA: Diagnosis not present

## 2022-03-25 DIAGNOSIS — F0283 Dementia in other diseases classified elsewhere, unspecified severity, with mood disturbance: Secondary | ICD-10-CM | POA: Diagnosis not present

## 2022-03-25 DIAGNOSIS — R32 Unspecified urinary incontinence: Secondary | ICD-10-CM | POA: Diagnosis not present

## 2022-03-25 DIAGNOSIS — G311 Senile degeneration of brain, not elsewhere classified: Secondary | ICD-10-CM | POA: Diagnosis not present

## 2022-03-25 DIAGNOSIS — R159 Full incontinence of feces: Secondary | ICD-10-CM | POA: Diagnosis not present

## 2022-03-25 DIAGNOSIS — I10 Essential (primary) hypertension: Secondary | ICD-10-CM | POA: Diagnosis not present

## 2022-03-27 DIAGNOSIS — R32 Unspecified urinary incontinence: Secondary | ICD-10-CM | POA: Diagnosis not present

## 2022-03-27 DIAGNOSIS — R159 Full incontinence of feces: Secondary | ICD-10-CM | POA: Diagnosis not present

## 2022-03-27 DIAGNOSIS — F0283 Dementia in other diseases classified elsewhere, unspecified severity, with mood disturbance: Secondary | ICD-10-CM | POA: Diagnosis not present

## 2022-03-27 DIAGNOSIS — G311 Senile degeneration of brain, not elsewhere classified: Secondary | ICD-10-CM | POA: Diagnosis not present

## 2022-03-27 DIAGNOSIS — R296 Repeated falls: Secondary | ICD-10-CM | POA: Diagnosis not present

## 2022-03-27 DIAGNOSIS — I10 Essential (primary) hypertension: Secondary | ICD-10-CM | POA: Diagnosis not present

## 2022-03-28 DIAGNOSIS — R159 Full incontinence of feces: Secondary | ICD-10-CM | POA: Diagnosis not present

## 2022-03-28 DIAGNOSIS — R32 Unspecified urinary incontinence: Secondary | ICD-10-CM | POA: Diagnosis not present

## 2022-03-28 DIAGNOSIS — I10 Essential (primary) hypertension: Secondary | ICD-10-CM | POA: Diagnosis not present

## 2022-03-28 DIAGNOSIS — F0283 Dementia in other diseases classified elsewhere, unspecified severity, with mood disturbance: Secondary | ICD-10-CM | POA: Diagnosis not present

## 2022-03-28 DIAGNOSIS — G311 Senile degeneration of brain, not elsewhere classified: Secondary | ICD-10-CM | POA: Diagnosis not present

## 2022-03-28 DIAGNOSIS — R296 Repeated falls: Secondary | ICD-10-CM | POA: Diagnosis not present

## 2022-03-29 DIAGNOSIS — R159 Full incontinence of feces: Secondary | ICD-10-CM | POA: Diagnosis not present

## 2022-03-29 DIAGNOSIS — R32 Unspecified urinary incontinence: Secondary | ICD-10-CM | POA: Diagnosis not present

## 2022-03-29 DIAGNOSIS — F0283 Dementia in other diseases classified elsewhere, unspecified severity, with mood disturbance: Secondary | ICD-10-CM | POA: Diagnosis not present

## 2022-03-29 DIAGNOSIS — G311 Senile degeneration of brain, not elsewhere classified: Secondary | ICD-10-CM | POA: Diagnosis not present

## 2022-03-29 DIAGNOSIS — R296 Repeated falls: Secondary | ICD-10-CM | POA: Diagnosis not present

## 2022-03-29 DIAGNOSIS — I10 Essential (primary) hypertension: Secondary | ICD-10-CM | POA: Diagnosis not present

## 2022-04-01 DIAGNOSIS — G311 Senile degeneration of brain, not elsewhere classified: Secondary | ICD-10-CM | POA: Diagnosis not present

## 2022-04-01 DIAGNOSIS — I10 Essential (primary) hypertension: Secondary | ICD-10-CM | POA: Diagnosis not present

## 2022-04-01 DIAGNOSIS — R32 Unspecified urinary incontinence: Secondary | ICD-10-CM | POA: Diagnosis not present

## 2022-04-01 DIAGNOSIS — R296 Repeated falls: Secondary | ICD-10-CM | POA: Diagnosis not present

## 2022-04-01 DIAGNOSIS — R159 Full incontinence of feces: Secondary | ICD-10-CM | POA: Diagnosis not present

## 2022-04-01 DIAGNOSIS — F0283 Dementia in other diseases classified elsewhere, unspecified severity, with mood disturbance: Secondary | ICD-10-CM | POA: Diagnosis not present

## 2022-04-02 DIAGNOSIS — F0283 Dementia in other diseases classified elsewhere, unspecified severity, with mood disturbance: Secondary | ICD-10-CM | POA: Diagnosis not present

## 2022-04-02 DIAGNOSIS — R32 Unspecified urinary incontinence: Secondary | ICD-10-CM | POA: Diagnosis not present

## 2022-04-02 DIAGNOSIS — R159 Full incontinence of feces: Secondary | ICD-10-CM | POA: Diagnosis not present

## 2022-04-02 DIAGNOSIS — G311 Senile degeneration of brain, not elsewhere classified: Secondary | ICD-10-CM | POA: Diagnosis not present

## 2022-04-02 DIAGNOSIS — I10 Essential (primary) hypertension: Secondary | ICD-10-CM | POA: Diagnosis not present

## 2022-04-02 DIAGNOSIS — R296 Repeated falls: Secondary | ICD-10-CM | POA: Diagnosis not present

## 2022-04-03 DIAGNOSIS — R32 Unspecified urinary incontinence: Secondary | ICD-10-CM | POA: Diagnosis not present

## 2022-04-03 DIAGNOSIS — R296 Repeated falls: Secondary | ICD-10-CM | POA: Diagnosis not present

## 2022-04-03 DIAGNOSIS — F0283 Dementia in other diseases classified elsewhere, unspecified severity, with mood disturbance: Secondary | ICD-10-CM | POA: Diagnosis not present

## 2022-04-03 DIAGNOSIS — R159 Full incontinence of feces: Secondary | ICD-10-CM | POA: Diagnosis not present

## 2022-04-03 DIAGNOSIS — G311 Senile degeneration of brain, not elsewhere classified: Secondary | ICD-10-CM | POA: Diagnosis not present

## 2022-04-03 DIAGNOSIS — I10 Essential (primary) hypertension: Secondary | ICD-10-CM | POA: Diagnosis not present

## 2022-04-04 DIAGNOSIS — R32 Unspecified urinary incontinence: Secondary | ICD-10-CM | POA: Diagnosis not present

## 2022-04-04 DIAGNOSIS — R296 Repeated falls: Secondary | ICD-10-CM | POA: Diagnosis not present

## 2022-04-04 DIAGNOSIS — R159 Full incontinence of feces: Secondary | ICD-10-CM | POA: Diagnosis not present

## 2022-04-04 DIAGNOSIS — G311 Senile degeneration of brain, not elsewhere classified: Secondary | ICD-10-CM | POA: Diagnosis not present

## 2022-04-04 DIAGNOSIS — F0283 Dementia in other diseases classified elsewhere, unspecified severity, with mood disturbance: Secondary | ICD-10-CM | POA: Diagnosis not present

## 2022-04-04 DIAGNOSIS — I10 Essential (primary) hypertension: Secondary | ICD-10-CM | POA: Diagnosis not present

## 2022-04-05 DIAGNOSIS — F0283 Dementia in other diseases classified elsewhere, unspecified severity, with mood disturbance: Secondary | ICD-10-CM | POA: Diagnosis not present

## 2022-04-05 DIAGNOSIS — R32 Unspecified urinary incontinence: Secondary | ICD-10-CM | POA: Diagnosis not present

## 2022-04-05 DIAGNOSIS — I10 Essential (primary) hypertension: Secondary | ICD-10-CM | POA: Diagnosis not present

## 2022-04-05 DIAGNOSIS — G311 Senile degeneration of brain, not elsewhere classified: Secondary | ICD-10-CM | POA: Diagnosis not present

## 2022-04-05 DIAGNOSIS — R159 Full incontinence of feces: Secondary | ICD-10-CM | POA: Diagnosis not present

## 2022-04-05 DIAGNOSIS — R296 Repeated falls: Secondary | ICD-10-CM | POA: Diagnosis not present

## 2022-04-06 DIAGNOSIS — G311 Senile degeneration of brain, not elsewhere classified: Secondary | ICD-10-CM | POA: Diagnosis not present

## 2022-04-06 DIAGNOSIS — R32 Unspecified urinary incontinence: Secondary | ICD-10-CM | POA: Diagnosis not present

## 2022-04-06 DIAGNOSIS — R159 Full incontinence of feces: Secondary | ICD-10-CM | POA: Diagnosis not present

## 2022-04-06 DIAGNOSIS — Z6820 Body mass index (BMI) 20.0-20.9, adult: Secondary | ICD-10-CM | POA: Diagnosis not present

## 2022-04-06 DIAGNOSIS — R296 Repeated falls: Secondary | ICD-10-CM | POA: Diagnosis not present

## 2022-04-06 DIAGNOSIS — F0283 Dementia in other diseases classified elsewhere, unspecified severity, with mood disturbance: Secondary | ICD-10-CM | POA: Diagnosis not present

## 2022-04-06 DIAGNOSIS — I10 Essential (primary) hypertension: Secondary | ICD-10-CM | POA: Diagnosis not present

## 2022-04-06 DIAGNOSIS — Z8781 Personal history of (healed) traumatic fracture: Secondary | ICD-10-CM | POA: Diagnosis not present

## 2022-04-06 DIAGNOSIS — Z741 Need for assistance with personal care: Secondary | ICD-10-CM | POA: Diagnosis not present

## 2022-04-08 DIAGNOSIS — I10 Essential (primary) hypertension: Secondary | ICD-10-CM | POA: Diagnosis not present

## 2022-04-08 DIAGNOSIS — R296 Repeated falls: Secondary | ICD-10-CM | POA: Diagnosis not present

## 2022-04-08 DIAGNOSIS — G311 Senile degeneration of brain, not elsewhere classified: Secondary | ICD-10-CM | POA: Diagnosis not present

## 2022-04-08 DIAGNOSIS — R32 Unspecified urinary incontinence: Secondary | ICD-10-CM | POA: Diagnosis not present

## 2022-04-08 DIAGNOSIS — F0283 Dementia in other diseases classified elsewhere, unspecified severity, with mood disturbance: Secondary | ICD-10-CM | POA: Diagnosis not present

## 2022-04-08 DIAGNOSIS — R159 Full incontinence of feces: Secondary | ICD-10-CM | POA: Diagnosis not present

## 2022-04-09 DIAGNOSIS — I10 Essential (primary) hypertension: Secondary | ICD-10-CM | POA: Diagnosis not present

## 2022-04-09 DIAGNOSIS — R296 Repeated falls: Secondary | ICD-10-CM | POA: Diagnosis not present

## 2022-04-09 DIAGNOSIS — G311 Senile degeneration of brain, not elsewhere classified: Secondary | ICD-10-CM | POA: Diagnosis not present

## 2022-04-09 DIAGNOSIS — F0283 Dementia in other diseases classified elsewhere, unspecified severity, with mood disturbance: Secondary | ICD-10-CM | POA: Diagnosis not present

## 2022-04-09 DIAGNOSIS — R32 Unspecified urinary incontinence: Secondary | ICD-10-CM | POA: Diagnosis not present

## 2022-04-09 DIAGNOSIS — R159 Full incontinence of feces: Secondary | ICD-10-CM | POA: Diagnosis not present

## 2022-04-10 DIAGNOSIS — I10 Essential (primary) hypertension: Secondary | ICD-10-CM | POA: Diagnosis not present

## 2022-04-10 DIAGNOSIS — G311 Senile degeneration of brain, not elsewhere classified: Secondary | ICD-10-CM | POA: Diagnosis not present

## 2022-04-10 DIAGNOSIS — R296 Repeated falls: Secondary | ICD-10-CM | POA: Diagnosis not present

## 2022-04-10 DIAGNOSIS — R32 Unspecified urinary incontinence: Secondary | ICD-10-CM | POA: Diagnosis not present

## 2022-04-10 DIAGNOSIS — F0283 Dementia in other diseases classified elsewhere, unspecified severity, with mood disturbance: Secondary | ICD-10-CM | POA: Diagnosis not present

## 2022-04-10 DIAGNOSIS — R159 Full incontinence of feces: Secondary | ICD-10-CM | POA: Diagnosis not present

## 2022-04-12 DIAGNOSIS — I10 Essential (primary) hypertension: Secondary | ICD-10-CM | POA: Diagnosis not present

## 2022-04-12 DIAGNOSIS — R296 Repeated falls: Secondary | ICD-10-CM | POA: Diagnosis not present

## 2022-04-12 DIAGNOSIS — R159 Full incontinence of feces: Secondary | ICD-10-CM | POA: Diagnosis not present

## 2022-04-12 DIAGNOSIS — R32 Unspecified urinary incontinence: Secondary | ICD-10-CM | POA: Diagnosis not present

## 2022-04-12 DIAGNOSIS — F0283 Dementia in other diseases classified elsewhere, unspecified severity, with mood disturbance: Secondary | ICD-10-CM | POA: Diagnosis not present

## 2022-04-12 DIAGNOSIS — G311 Senile degeneration of brain, not elsewhere classified: Secondary | ICD-10-CM | POA: Diagnosis not present

## 2022-04-15 DIAGNOSIS — R296 Repeated falls: Secondary | ICD-10-CM | POA: Diagnosis not present

## 2022-04-15 DIAGNOSIS — F0283 Dementia in other diseases classified elsewhere, unspecified severity, with mood disturbance: Secondary | ICD-10-CM | POA: Diagnosis not present

## 2022-04-15 DIAGNOSIS — R159 Full incontinence of feces: Secondary | ICD-10-CM | POA: Diagnosis not present

## 2022-04-15 DIAGNOSIS — I10 Essential (primary) hypertension: Secondary | ICD-10-CM | POA: Diagnosis not present

## 2022-04-15 DIAGNOSIS — G311 Senile degeneration of brain, not elsewhere classified: Secondary | ICD-10-CM | POA: Diagnosis not present

## 2022-04-15 DIAGNOSIS — R32 Unspecified urinary incontinence: Secondary | ICD-10-CM | POA: Diagnosis not present

## 2022-04-16 DIAGNOSIS — G311 Senile degeneration of brain, not elsewhere classified: Secondary | ICD-10-CM | POA: Diagnosis not present

## 2022-04-16 DIAGNOSIS — R159 Full incontinence of feces: Secondary | ICD-10-CM | POA: Diagnosis not present

## 2022-04-16 DIAGNOSIS — R296 Repeated falls: Secondary | ICD-10-CM | POA: Diagnosis not present

## 2022-04-16 DIAGNOSIS — F0283 Dementia in other diseases classified elsewhere, unspecified severity, with mood disturbance: Secondary | ICD-10-CM | POA: Diagnosis not present

## 2022-04-16 DIAGNOSIS — R32 Unspecified urinary incontinence: Secondary | ICD-10-CM | POA: Diagnosis not present

## 2022-04-16 DIAGNOSIS — I10 Essential (primary) hypertension: Secondary | ICD-10-CM | POA: Diagnosis not present

## 2022-04-17 DIAGNOSIS — R159 Full incontinence of feces: Secondary | ICD-10-CM | POA: Diagnosis not present

## 2022-04-17 DIAGNOSIS — R296 Repeated falls: Secondary | ICD-10-CM | POA: Diagnosis not present

## 2022-04-17 DIAGNOSIS — I10 Essential (primary) hypertension: Secondary | ICD-10-CM | POA: Diagnosis not present

## 2022-04-17 DIAGNOSIS — R32 Unspecified urinary incontinence: Secondary | ICD-10-CM | POA: Diagnosis not present

## 2022-04-17 DIAGNOSIS — G311 Senile degeneration of brain, not elsewhere classified: Secondary | ICD-10-CM | POA: Diagnosis not present

## 2022-04-17 DIAGNOSIS — F0283 Dementia in other diseases classified elsewhere, unspecified severity, with mood disturbance: Secondary | ICD-10-CM | POA: Diagnosis not present

## 2022-04-22 DIAGNOSIS — I10 Essential (primary) hypertension: Secondary | ICD-10-CM | POA: Diagnosis not present

## 2022-04-22 DIAGNOSIS — F0283 Dementia in other diseases classified elsewhere, unspecified severity, with mood disturbance: Secondary | ICD-10-CM | POA: Diagnosis not present

## 2022-04-22 DIAGNOSIS — G311 Senile degeneration of brain, not elsewhere classified: Secondary | ICD-10-CM | POA: Diagnosis not present

## 2022-04-22 DIAGNOSIS — R159 Full incontinence of feces: Secondary | ICD-10-CM | POA: Diagnosis not present

## 2022-04-22 DIAGNOSIS — R32 Unspecified urinary incontinence: Secondary | ICD-10-CM | POA: Diagnosis not present

## 2022-04-22 DIAGNOSIS — R296 Repeated falls: Secondary | ICD-10-CM | POA: Diagnosis not present

## 2022-04-23 DIAGNOSIS — G311 Senile degeneration of brain, not elsewhere classified: Secondary | ICD-10-CM | POA: Diagnosis not present

## 2022-04-23 DIAGNOSIS — I10 Essential (primary) hypertension: Secondary | ICD-10-CM | POA: Diagnosis not present

## 2022-04-23 DIAGNOSIS — R159 Full incontinence of feces: Secondary | ICD-10-CM | POA: Diagnosis not present

## 2022-04-23 DIAGNOSIS — R296 Repeated falls: Secondary | ICD-10-CM | POA: Diagnosis not present

## 2022-04-23 DIAGNOSIS — F0283 Dementia in other diseases classified elsewhere, unspecified severity, with mood disturbance: Secondary | ICD-10-CM | POA: Diagnosis not present

## 2022-04-23 DIAGNOSIS — R32 Unspecified urinary incontinence: Secondary | ICD-10-CM | POA: Diagnosis not present

## 2022-04-24 DIAGNOSIS — G311 Senile degeneration of brain, not elsewhere classified: Secondary | ICD-10-CM | POA: Diagnosis not present

## 2022-04-24 DIAGNOSIS — R159 Full incontinence of feces: Secondary | ICD-10-CM | POA: Diagnosis not present

## 2022-04-24 DIAGNOSIS — R296 Repeated falls: Secondary | ICD-10-CM | POA: Diagnosis not present

## 2022-04-24 DIAGNOSIS — I10 Essential (primary) hypertension: Secondary | ICD-10-CM | POA: Diagnosis not present

## 2022-04-24 DIAGNOSIS — R32 Unspecified urinary incontinence: Secondary | ICD-10-CM | POA: Diagnosis not present

## 2022-04-24 DIAGNOSIS — F0283 Dementia in other diseases classified elsewhere, unspecified severity, with mood disturbance: Secondary | ICD-10-CM | POA: Diagnosis not present

## 2022-04-26 DIAGNOSIS — R296 Repeated falls: Secondary | ICD-10-CM | POA: Diagnosis not present

## 2022-04-26 DIAGNOSIS — I10 Essential (primary) hypertension: Secondary | ICD-10-CM | POA: Diagnosis not present

## 2022-04-26 DIAGNOSIS — F0283 Dementia in other diseases classified elsewhere, unspecified severity, with mood disturbance: Secondary | ICD-10-CM | POA: Diagnosis not present

## 2022-04-26 DIAGNOSIS — R32 Unspecified urinary incontinence: Secondary | ICD-10-CM | POA: Diagnosis not present

## 2022-04-26 DIAGNOSIS — R159 Full incontinence of feces: Secondary | ICD-10-CM | POA: Diagnosis not present

## 2022-04-26 DIAGNOSIS — G311 Senile degeneration of brain, not elsewhere classified: Secondary | ICD-10-CM | POA: Diagnosis not present

## 2022-04-29 DIAGNOSIS — G311 Senile degeneration of brain, not elsewhere classified: Secondary | ICD-10-CM | POA: Diagnosis not present

## 2022-04-29 DIAGNOSIS — R32 Unspecified urinary incontinence: Secondary | ICD-10-CM | POA: Diagnosis not present

## 2022-04-29 DIAGNOSIS — R296 Repeated falls: Secondary | ICD-10-CM | POA: Diagnosis not present

## 2022-04-29 DIAGNOSIS — R159 Full incontinence of feces: Secondary | ICD-10-CM | POA: Diagnosis not present

## 2022-04-29 DIAGNOSIS — I10 Essential (primary) hypertension: Secondary | ICD-10-CM | POA: Diagnosis not present

## 2022-04-29 DIAGNOSIS — F0283 Dementia in other diseases classified elsewhere, unspecified severity, with mood disturbance: Secondary | ICD-10-CM | POA: Diagnosis not present

## 2022-04-30 DIAGNOSIS — F0283 Dementia in other diseases classified elsewhere, unspecified severity, with mood disturbance: Secondary | ICD-10-CM | POA: Diagnosis not present

## 2022-04-30 DIAGNOSIS — I10 Essential (primary) hypertension: Secondary | ICD-10-CM | POA: Diagnosis not present

## 2022-04-30 DIAGNOSIS — R32 Unspecified urinary incontinence: Secondary | ICD-10-CM | POA: Diagnosis not present

## 2022-04-30 DIAGNOSIS — R159 Full incontinence of feces: Secondary | ICD-10-CM | POA: Diagnosis not present

## 2022-04-30 DIAGNOSIS — G311 Senile degeneration of brain, not elsewhere classified: Secondary | ICD-10-CM | POA: Diagnosis not present

## 2022-04-30 DIAGNOSIS — R296 Repeated falls: Secondary | ICD-10-CM | POA: Diagnosis not present

## 2022-05-01 DIAGNOSIS — F0283 Dementia in other diseases classified elsewhere, unspecified severity, with mood disturbance: Secondary | ICD-10-CM | POA: Diagnosis not present

## 2022-05-01 DIAGNOSIS — R296 Repeated falls: Secondary | ICD-10-CM | POA: Diagnosis not present

## 2022-05-01 DIAGNOSIS — R32 Unspecified urinary incontinence: Secondary | ICD-10-CM | POA: Diagnosis not present

## 2022-05-01 DIAGNOSIS — R159 Full incontinence of feces: Secondary | ICD-10-CM | POA: Diagnosis not present

## 2022-05-01 DIAGNOSIS — G311 Senile degeneration of brain, not elsewhere classified: Secondary | ICD-10-CM | POA: Diagnosis not present

## 2022-05-01 DIAGNOSIS — I10 Essential (primary) hypertension: Secondary | ICD-10-CM | POA: Diagnosis not present

## 2022-05-03 DIAGNOSIS — I10 Essential (primary) hypertension: Secondary | ICD-10-CM | POA: Diagnosis not present

## 2022-05-03 DIAGNOSIS — G311 Senile degeneration of brain, not elsewhere classified: Secondary | ICD-10-CM | POA: Diagnosis not present

## 2022-05-03 DIAGNOSIS — R32 Unspecified urinary incontinence: Secondary | ICD-10-CM | POA: Diagnosis not present

## 2022-05-03 DIAGNOSIS — R159 Full incontinence of feces: Secondary | ICD-10-CM | POA: Diagnosis not present

## 2022-05-03 DIAGNOSIS — F0283 Dementia in other diseases classified elsewhere, unspecified severity, with mood disturbance: Secondary | ICD-10-CM | POA: Diagnosis not present

## 2022-05-03 DIAGNOSIS — R296 Repeated falls: Secondary | ICD-10-CM | POA: Diagnosis not present

## 2022-05-06 DIAGNOSIS — R296 Repeated falls: Secondary | ICD-10-CM | POA: Diagnosis not present

## 2022-05-06 DIAGNOSIS — R32 Unspecified urinary incontinence: Secondary | ICD-10-CM | POA: Diagnosis not present

## 2022-05-06 DIAGNOSIS — G311 Senile degeneration of brain, not elsewhere classified: Secondary | ICD-10-CM | POA: Diagnosis not present

## 2022-05-06 DIAGNOSIS — I10 Essential (primary) hypertension: Secondary | ICD-10-CM | POA: Diagnosis not present

## 2022-05-06 DIAGNOSIS — F0283 Dementia in other diseases classified elsewhere, unspecified severity, with mood disturbance: Secondary | ICD-10-CM | POA: Diagnosis not present

## 2022-05-06 DIAGNOSIS — R159 Full incontinence of feces: Secondary | ICD-10-CM | POA: Diagnosis not present

## 2022-05-07 DIAGNOSIS — I10 Essential (primary) hypertension: Secondary | ICD-10-CM | POA: Diagnosis not present

## 2022-05-07 DIAGNOSIS — R159 Full incontinence of feces: Secondary | ICD-10-CM | POA: Diagnosis not present

## 2022-05-07 DIAGNOSIS — R32 Unspecified urinary incontinence: Secondary | ICD-10-CM | POA: Diagnosis not present

## 2022-05-07 DIAGNOSIS — Z6820 Body mass index (BMI) 20.0-20.9, adult: Secondary | ICD-10-CM | POA: Diagnosis not present

## 2022-05-07 DIAGNOSIS — Z8781 Personal history of (healed) traumatic fracture: Secondary | ICD-10-CM | POA: Diagnosis not present

## 2022-05-07 DIAGNOSIS — Z741 Need for assistance with personal care: Secondary | ICD-10-CM | POA: Diagnosis not present

## 2022-05-07 DIAGNOSIS — F0283 Dementia in other diseases classified elsewhere, unspecified severity, with mood disturbance: Secondary | ICD-10-CM | POA: Diagnosis not present

## 2022-05-07 DIAGNOSIS — G311 Senile degeneration of brain, not elsewhere classified: Secondary | ICD-10-CM | POA: Diagnosis not present

## 2022-05-07 DIAGNOSIS — R296 Repeated falls: Secondary | ICD-10-CM | POA: Diagnosis not present

## 2022-05-08 DIAGNOSIS — F0283 Dementia in other diseases classified elsewhere, unspecified severity, with mood disturbance: Secondary | ICD-10-CM | POA: Diagnosis not present

## 2022-05-08 DIAGNOSIS — R159 Full incontinence of feces: Secondary | ICD-10-CM | POA: Diagnosis not present

## 2022-05-08 DIAGNOSIS — G311 Senile degeneration of brain, not elsewhere classified: Secondary | ICD-10-CM | POA: Diagnosis not present

## 2022-05-08 DIAGNOSIS — R296 Repeated falls: Secondary | ICD-10-CM | POA: Diagnosis not present

## 2022-05-08 DIAGNOSIS — R32 Unspecified urinary incontinence: Secondary | ICD-10-CM | POA: Diagnosis not present

## 2022-05-08 DIAGNOSIS — I10 Essential (primary) hypertension: Secondary | ICD-10-CM | POA: Diagnosis not present

## 2022-05-10 DIAGNOSIS — F0283 Dementia in other diseases classified elsewhere, unspecified severity, with mood disturbance: Secondary | ICD-10-CM | POA: Diagnosis not present

## 2022-05-10 DIAGNOSIS — R32 Unspecified urinary incontinence: Secondary | ICD-10-CM | POA: Diagnosis not present

## 2022-05-10 DIAGNOSIS — G311 Senile degeneration of brain, not elsewhere classified: Secondary | ICD-10-CM | POA: Diagnosis not present

## 2022-05-10 DIAGNOSIS — R296 Repeated falls: Secondary | ICD-10-CM | POA: Diagnosis not present

## 2022-05-10 DIAGNOSIS — R159 Full incontinence of feces: Secondary | ICD-10-CM | POA: Diagnosis not present

## 2022-05-10 DIAGNOSIS — I10 Essential (primary) hypertension: Secondary | ICD-10-CM | POA: Diagnosis not present

## 2022-05-13 DIAGNOSIS — R32 Unspecified urinary incontinence: Secondary | ICD-10-CM | POA: Diagnosis not present

## 2022-05-13 DIAGNOSIS — R159 Full incontinence of feces: Secondary | ICD-10-CM | POA: Diagnosis not present

## 2022-05-13 DIAGNOSIS — R296 Repeated falls: Secondary | ICD-10-CM | POA: Diagnosis not present

## 2022-05-13 DIAGNOSIS — G311 Senile degeneration of brain, not elsewhere classified: Secondary | ICD-10-CM | POA: Diagnosis not present

## 2022-05-13 DIAGNOSIS — I10 Essential (primary) hypertension: Secondary | ICD-10-CM | POA: Diagnosis not present

## 2022-05-13 DIAGNOSIS — F0283 Dementia in other diseases classified elsewhere, unspecified severity, with mood disturbance: Secondary | ICD-10-CM | POA: Diagnosis not present

## 2022-05-14 DIAGNOSIS — F0283 Dementia in other diseases classified elsewhere, unspecified severity, with mood disturbance: Secondary | ICD-10-CM | POA: Diagnosis not present

## 2022-05-14 DIAGNOSIS — G311 Senile degeneration of brain, not elsewhere classified: Secondary | ICD-10-CM | POA: Diagnosis not present

## 2022-05-14 DIAGNOSIS — R296 Repeated falls: Secondary | ICD-10-CM | POA: Diagnosis not present

## 2022-05-14 DIAGNOSIS — I10 Essential (primary) hypertension: Secondary | ICD-10-CM | POA: Diagnosis not present

## 2022-05-14 DIAGNOSIS — R159 Full incontinence of feces: Secondary | ICD-10-CM | POA: Diagnosis not present

## 2022-05-14 DIAGNOSIS — R32 Unspecified urinary incontinence: Secondary | ICD-10-CM | POA: Diagnosis not present

## 2022-05-15 DIAGNOSIS — R296 Repeated falls: Secondary | ICD-10-CM | POA: Diagnosis not present

## 2022-05-15 DIAGNOSIS — R159 Full incontinence of feces: Secondary | ICD-10-CM | POA: Diagnosis not present

## 2022-05-15 DIAGNOSIS — G311 Senile degeneration of brain, not elsewhere classified: Secondary | ICD-10-CM | POA: Diagnosis not present

## 2022-05-15 DIAGNOSIS — R32 Unspecified urinary incontinence: Secondary | ICD-10-CM | POA: Diagnosis not present

## 2022-05-15 DIAGNOSIS — I10 Essential (primary) hypertension: Secondary | ICD-10-CM | POA: Diagnosis not present

## 2022-05-15 DIAGNOSIS — F0283 Dementia in other diseases classified elsewhere, unspecified severity, with mood disturbance: Secondary | ICD-10-CM | POA: Diagnosis not present

## 2022-05-16 DIAGNOSIS — K5901 Slow transit constipation: Secondary | ICD-10-CM | POA: Diagnosis not present

## 2022-05-16 DIAGNOSIS — I10 Essential (primary) hypertension: Secondary | ICD-10-CM | POA: Diagnosis not present

## 2022-05-16 DIAGNOSIS — F0390 Unspecified dementia without behavioral disturbance: Secondary | ICD-10-CM | POA: Diagnosis not present

## 2022-05-17 DIAGNOSIS — F0283 Dementia in other diseases classified elsewhere, unspecified severity, with mood disturbance: Secondary | ICD-10-CM | POA: Diagnosis not present

## 2022-05-17 DIAGNOSIS — R32 Unspecified urinary incontinence: Secondary | ICD-10-CM | POA: Diagnosis not present

## 2022-05-17 DIAGNOSIS — I10 Essential (primary) hypertension: Secondary | ICD-10-CM | POA: Diagnosis not present

## 2022-05-17 DIAGNOSIS — R159 Full incontinence of feces: Secondary | ICD-10-CM | POA: Diagnosis not present

## 2022-05-17 DIAGNOSIS — G311 Senile degeneration of brain, not elsewhere classified: Secondary | ICD-10-CM | POA: Diagnosis not present

## 2022-05-17 DIAGNOSIS — R296 Repeated falls: Secondary | ICD-10-CM | POA: Diagnosis not present

## 2022-05-20 DIAGNOSIS — R159 Full incontinence of feces: Secondary | ICD-10-CM | POA: Diagnosis not present

## 2022-05-20 DIAGNOSIS — R296 Repeated falls: Secondary | ICD-10-CM | POA: Diagnosis not present

## 2022-05-20 DIAGNOSIS — R32 Unspecified urinary incontinence: Secondary | ICD-10-CM | POA: Diagnosis not present

## 2022-05-20 DIAGNOSIS — G311 Senile degeneration of brain, not elsewhere classified: Secondary | ICD-10-CM | POA: Diagnosis not present

## 2022-05-20 DIAGNOSIS — F0283 Dementia in other diseases classified elsewhere, unspecified severity, with mood disturbance: Secondary | ICD-10-CM | POA: Diagnosis not present

## 2022-05-20 DIAGNOSIS — I10 Essential (primary) hypertension: Secondary | ICD-10-CM | POA: Diagnosis not present

## 2022-05-21 DIAGNOSIS — R296 Repeated falls: Secondary | ICD-10-CM | POA: Diagnosis not present

## 2022-05-21 DIAGNOSIS — R32 Unspecified urinary incontinence: Secondary | ICD-10-CM | POA: Diagnosis not present

## 2022-05-21 DIAGNOSIS — I10 Essential (primary) hypertension: Secondary | ICD-10-CM | POA: Diagnosis not present

## 2022-05-21 DIAGNOSIS — R159 Full incontinence of feces: Secondary | ICD-10-CM | POA: Diagnosis not present

## 2022-05-21 DIAGNOSIS — F0283 Dementia in other diseases classified elsewhere, unspecified severity, with mood disturbance: Secondary | ICD-10-CM | POA: Diagnosis not present

## 2022-05-21 DIAGNOSIS — G311 Senile degeneration of brain, not elsewhere classified: Secondary | ICD-10-CM | POA: Diagnosis not present

## 2022-05-22 DIAGNOSIS — R159 Full incontinence of feces: Secondary | ICD-10-CM | POA: Diagnosis not present

## 2022-05-22 DIAGNOSIS — F0283 Dementia in other diseases classified elsewhere, unspecified severity, with mood disturbance: Secondary | ICD-10-CM | POA: Diagnosis not present

## 2022-05-22 DIAGNOSIS — R32 Unspecified urinary incontinence: Secondary | ICD-10-CM | POA: Diagnosis not present

## 2022-05-22 DIAGNOSIS — I10 Essential (primary) hypertension: Secondary | ICD-10-CM | POA: Diagnosis not present

## 2022-05-22 DIAGNOSIS — G311 Senile degeneration of brain, not elsewhere classified: Secondary | ICD-10-CM | POA: Diagnosis not present

## 2022-05-22 DIAGNOSIS — R296 Repeated falls: Secondary | ICD-10-CM | POA: Diagnosis not present

## 2022-05-24 DIAGNOSIS — G311 Senile degeneration of brain, not elsewhere classified: Secondary | ICD-10-CM | POA: Diagnosis not present

## 2022-05-24 DIAGNOSIS — F0283 Dementia in other diseases classified elsewhere, unspecified severity, with mood disturbance: Secondary | ICD-10-CM | POA: Diagnosis not present

## 2022-05-24 DIAGNOSIS — I10 Essential (primary) hypertension: Secondary | ICD-10-CM | POA: Diagnosis not present

## 2022-05-24 DIAGNOSIS — R296 Repeated falls: Secondary | ICD-10-CM | POA: Diagnosis not present

## 2022-05-24 DIAGNOSIS — R159 Full incontinence of feces: Secondary | ICD-10-CM | POA: Diagnosis not present

## 2022-05-24 DIAGNOSIS — R32 Unspecified urinary incontinence: Secondary | ICD-10-CM | POA: Diagnosis not present

## 2022-05-27 DIAGNOSIS — I10 Essential (primary) hypertension: Secondary | ICD-10-CM | POA: Diagnosis not present

## 2022-05-27 DIAGNOSIS — F0283 Dementia in other diseases classified elsewhere, unspecified severity, with mood disturbance: Secondary | ICD-10-CM | POA: Diagnosis not present

## 2022-05-27 DIAGNOSIS — G311 Senile degeneration of brain, not elsewhere classified: Secondary | ICD-10-CM | POA: Diagnosis not present

## 2022-05-27 DIAGNOSIS — R296 Repeated falls: Secondary | ICD-10-CM | POA: Diagnosis not present

## 2022-05-27 DIAGNOSIS — R159 Full incontinence of feces: Secondary | ICD-10-CM | POA: Diagnosis not present

## 2022-05-27 DIAGNOSIS — R32 Unspecified urinary incontinence: Secondary | ICD-10-CM | POA: Diagnosis not present

## 2022-05-28 DIAGNOSIS — R32 Unspecified urinary incontinence: Secondary | ICD-10-CM | POA: Diagnosis not present

## 2022-05-28 DIAGNOSIS — F0283 Dementia in other diseases classified elsewhere, unspecified severity, with mood disturbance: Secondary | ICD-10-CM | POA: Diagnosis not present

## 2022-05-28 DIAGNOSIS — I10 Essential (primary) hypertension: Secondary | ICD-10-CM | POA: Diagnosis not present

## 2022-05-28 DIAGNOSIS — R159 Full incontinence of feces: Secondary | ICD-10-CM | POA: Diagnosis not present

## 2022-05-28 DIAGNOSIS — R296 Repeated falls: Secondary | ICD-10-CM | POA: Diagnosis not present

## 2022-05-28 DIAGNOSIS — G311 Senile degeneration of brain, not elsewhere classified: Secondary | ICD-10-CM | POA: Diagnosis not present

## 2022-05-29 DIAGNOSIS — K5904 Chronic idiopathic constipation: Secondary | ICD-10-CM | POA: Diagnosis not present

## 2022-05-29 DIAGNOSIS — Z9181 History of falling: Secondary | ICD-10-CM | POA: Diagnosis not present

## 2022-05-29 DIAGNOSIS — R296 Repeated falls: Secondary | ICD-10-CM | POA: Diagnosis not present

## 2022-05-29 DIAGNOSIS — F331 Major depressive disorder, recurrent, moderate: Secondary | ICD-10-CM | POA: Diagnosis not present

## 2022-05-29 DIAGNOSIS — I10 Essential (primary) hypertension: Secondary | ICD-10-CM | POA: Diagnosis not present

## 2022-05-29 DIAGNOSIS — R159 Full incontinence of feces: Secondary | ICD-10-CM | POA: Diagnosis not present

## 2022-05-29 DIAGNOSIS — F0283 Dementia in other diseases classified elsewhere, unspecified severity, with mood disturbance: Secondary | ICD-10-CM | POA: Diagnosis not present

## 2022-05-29 DIAGNOSIS — G311 Senile degeneration of brain, not elsewhere classified: Secondary | ICD-10-CM | POA: Diagnosis not present

## 2022-05-29 DIAGNOSIS — R32 Unspecified urinary incontinence: Secondary | ICD-10-CM | POA: Diagnosis not present

## 2022-05-31 DIAGNOSIS — R159 Full incontinence of feces: Secondary | ICD-10-CM | POA: Diagnosis not present

## 2022-05-31 DIAGNOSIS — I10 Essential (primary) hypertension: Secondary | ICD-10-CM | POA: Diagnosis not present

## 2022-05-31 DIAGNOSIS — R32 Unspecified urinary incontinence: Secondary | ICD-10-CM | POA: Diagnosis not present

## 2022-05-31 DIAGNOSIS — F0283 Dementia in other diseases classified elsewhere, unspecified severity, with mood disturbance: Secondary | ICD-10-CM | POA: Diagnosis not present

## 2022-05-31 DIAGNOSIS — G311 Senile degeneration of brain, not elsewhere classified: Secondary | ICD-10-CM | POA: Diagnosis not present

## 2022-05-31 DIAGNOSIS — R296 Repeated falls: Secondary | ICD-10-CM | POA: Diagnosis not present

## 2022-06-03 DIAGNOSIS — R159 Full incontinence of feces: Secondary | ICD-10-CM | POA: Diagnosis not present

## 2022-06-03 DIAGNOSIS — R32 Unspecified urinary incontinence: Secondary | ICD-10-CM | POA: Diagnosis not present

## 2022-06-03 DIAGNOSIS — R296 Repeated falls: Secondary | ICD-10-CM | POA: Diagnosis not present

## 2022-06-03 DIAGNOSIS — F0283 Dementia in other diseases classified elsewhere, unspecified severity, with mood disturbance: Secondary | ICD-10-CM | POA: Diagnosis not present

## 2022-06-03 DIAGNOSIS — I10 Essential (primary) hypertension: Secondary | ICD-10-CM | POA: Diagnosis not present

## 2022-06-03 DIAGNOSIS — G311 Senile degeneration of brain, not elsewhere classified: Secondary | ICD-10-CM | POA: Diagnosis not present

## 2022-06-04 DIAGNOSIS — R296 Repeated falls: Secondary | ICD-10-CM | POA: Diagnosis not present

## 2022-06-04 DIAGNOSIS — R159 Full incontinence of feces: Secondary | ICD-10-CM | POA: Diagnosis not present

## 2022-06-04 DIAGNOSIS — G311 Senile degeneration of brain, not elsewhere classified: Secondary | ICD-10-CM | POA: Diagnosis not present

## 2022-06-04 DIAGNOSIS — F0283 Dementia in other diseases classified elsewhere, unspecified severity, with mood disturbance: Secondary | ICD-10-CM | POA: Diagnosis not present

## 2022-06-04 DIAGNOSIS — R32 Unspecified urinary incontinence: Secondary | ICD-10-CM | POA: Diagnosis not present

## 2022-06-04 DIAGNOSIS — I10 Essential (primary) hypertension: Secondary | ICD-10-CM | POA: Diagnosis not present

## 2022-06-05 DIAGNOSIS — F331 Major depressive disorder, recurrent, moderate: Secondary | ICD-10-CM | POA: Diagnosis not present

## 2022-06-05 DIAGNOSIS — R32 Unspecified urinary incontinence: Secondary | ICD-10-CM | POA: Diagnosis not present

## 2022-06-05 DIAGNOSIS — R296 Repeated falls: Secondary | ICD-10-CM | POA: Diagnosis not present

## 2022-06-05 DIAGNOSIS — I10 Essential (primary) hypertension: Secondary | ICD-10-CM | POA: Diagnosis not present

## 2022-06-05 DIAGNOSIS — F0283 Dementia in other diseases classified elsewhere, unspecified severity, with mood disturbance: Secondary | ICD-10-CM | POA: Diagnosis not present

## 2022-06-05 DIAGNOSIS — I158 Other secondary hypertension: Secondary | ICD-10-CM | POA: Diagnosis not present

## 2022-06-05 DIAGNOSIS — G311 Senile degeneration of brain, not elsewhere classified: Secondary | ICD-10-CM | POA: Diagnosis not present

## 2022-06-05 DIAGNOSIS — R159 Full incontinence of feces: Secondary | ICD-10-CM | POA: Diagnosis not present

## 2022-06-06 DIAGNOSIS — I10 Essential (primary) hypertension: Secondary | ICD-10-CM | POA: Diagnosis not present

## 2022-06-06 DIAGNOSIS — F0390 Unspecified dementia without behavioral disturbance: Secondary | ICD-10-CM | POA: Diagnosis not present

## 2022-06-06 DIAGNOSIS — K5901 Slow transit constipation: Secondary | ICD-10-CM | POA: Diagnosis not present

## 2022-06-07 DIAGNOSIS — G311 Senile degeneration of brain, not elsewhere classified: Secondary | ICD-10-CM | POA: Diagnosis not present

## 2022-06-07 DIAGNOSIS — I10 Essential (primary) hypertension: Secondary | ICD-10-CM | POA: Diagnosis not present

## 2022-06-07 DIAGNOSIS — Z6820 Body mass index (BMI) 20.0-20.9, adult: Secondary | ICD-10-CM | POA: Diagnosis not present

## 2022-06-07 DIAGNOSIS — Z8781 Personal history of (healed) traumatic fracture: Secondary | ICD-10-CM | POA: Diagnosis not present

## 2022-06-07 DIAGNOSIS — R159 Full incontinence of feces: Secondary | ICD-10-CM | POA: Diagnosis not present

## 2022-06-07 DIAGNOSIS — R296 Repeated falls: Secondary | ICD-10-CM | POA: Diagnosis not present

## 2022-06-07 DIAGNOSIS — F0283 Dementia in other diseases classified elsewhere, unspecified severity, with mood disturbance: Secondary | ICD-10-CM | POA: Diagnosis not present

## 2022-06-07 DIAGNOSIS — R32 Unspecified urinary incontinence: Secondary | ICD-10-CM | POA: Diagnosis not present

## 2022-06-07 DIAGNOSIS — Z741 Need for assistance with personal care: Secondary | ICD-10-CM | POA: Diagnosis not present

## 2022-06-10 DIAGNOSIS — R159 Full incontinence of feces: Secondary | ICD-10-CM | POA: Diagnosis not present

## 2022-06-10 DIAGNOSIS — R296 Repeated falls: Secondary | ICD-10-CM | POA: Diagnosis not present

## 2022-06-10 DIAGNOSIS — I10 Essential (primary) hypertension: Secondary | ICD-10-CM | POA: Diagnosis not present

## 2022-06-10 DIAGNOSIS — R32 Unspecified urinary incontinence: Secondary | ICD-10-CM | POA: Diagnosis not present

## 2022-06-10 DIAGNOSIS — F0283 Dementia in other diseases classified elsewhere, unspecified severity, with mood disturbance: Secondary | ICD-10-CM | POA: Diagnosis not present

## 2022-06-10 DIAGNOSIS — G311 Senile degeneration of brain, not elsewhere classified: Secondary | ICD-10-CM | POA: Diagnosis not present

## 2022-06-11 DIAGNOSIS — G311 Senile degeneration of brain, not elsewhere classified: Secondary | ICD-10-CM | POA: Diagnosis not present

## 2022-06-11 DIAGNOSIS — I10 Essential (primary) hypertension: Secondary | ICD-10-CM | POA: Diagnosis not present

## 2022-06-11 DIAGNOSIS — R32 Unspecified urinary incontinence: Secondary | ICD-10-CM | POA: Diagnosis not present

## 2022-06-11 DIAGNOSIS — R159 Full incontinence of feces: Secondary | ICD-10-CM | POA: Diagnosis not present

## 2022-06-11 DIAGNOSIS — F0283 Dementia in other diseases classified elsewhere, unspecified severity, with mood disturbance: Secondary | ICD-10-CM | POA: Diagnosis not present

## 2022-06-11 DIAGNOSIS — R296 Repeated falls: Secondary | ICD-10-CM | POA: Diagnosis not present

## 2022-06-12 DIAGNOSIS — G311 Senile degeneration of brain, not elsewhere classified: Secondary | ICD-10-CM | POA: Diagnosis not present

## 2022-06-12 DIAGNOSIS — F0283 Dementia in other diseases classified elsewhere, unspecified severity, with mood disturbance: Secondary | ICD-10-CM | POA: Diagnosis not present

## 2022-06-12 DIAGNOSIS — R296 Repeated falls: Secondary | ICD-10-CM | POA: Diagnosis not present

## 2022-06-12 DIAGNOSIS — I10 Essential (primary) hypertension: Secondary | ICD-10-CM | POA: Diagnosis not present

## 2022-06-12 DIAGNOSIS — R159 Full incontinence of feces: Secondary | ICD-10-CM | POA: Diagnosis not present

## 2022-06-12 DIAGNOSIS — R32 Unspecified urinary incontinence: Secondary | ICD-10-CM | POA: Diagnosis not present

## 2022-06-14 DIAGNOSIS — R296 Repeated falls: Secondary | ICD-10-CM | POA: Diagnosis not present

## 2022-06-14 DIAGNOSIS — G311 Senile degeneration of brain, not elsewhere classified: Secondary | ICD-10-CM | POA: Diagnosis not present

## 2022-06-14 DIAGNOSIS — I10 Essential (primary) hypertension: Secondary | ICD-10-CM | POA: Diagnosis not present

## 2022-06-14 DIAGNOSIS — R159 Full incontinence of feces: Secondary | ICD-10-CM | POA: Diagnosis not present

## 2022-06-14 DIAGNOSIS — R32 Unspecified urinary incontinence: Secondary | ICD-10-CM | POA: Diagnosis not present

## 2022-06-14 DIAGNOSIS — F0283 Dementia in other diseases classified elsewhere, unspecified severity, with mood disturbance: Secondary | ICD-10-CM | POA: Diagnosis not present

## 2022-06-17 DIAGNOSIS — R32 Unspecified urinary incontinence: Secondary | ICD-10-CM | POA: Diagnosis not present

## 2022-06-17 DIAGNOSIS — I10 Essential (primary) hypertension: Secondary | ICD-10-CM | POA: Diagnosis not present

## 2022-06-17 DIAGNOSIS — R159 Full incontinence of feces: Secondary | ICD-10-CM | POA: Diagnosis not present

## 2022-06-17 DIAGNOSIS — R296 Repeated falls: Secondary | ICD-10-CM | POA: Diagnosis not present

## 2022-06-17 DIAGNOSIS — G311 Senile degeneration of brain, not elsewhere classified: Secondary | ICD-10-CM | POA: Diagnosis not present

## 2022-06-17 DIAGNOSIS — F0283 Dementia in other diseases classified elsewhere, unspecified severity, with mood disturbance: Secondary | ICD-10-CM | POA: Diagnosis not present

## 2022-06-18 DIAGNOSIS — I10 Essential (primary) hypertension: Secondary | ICD-10-CM | POA: Diagnosis not present

## 2022-06-18 DIAGNOSIS — R159 Full incontinence of feces: Secondary | ICD-10-CM | POA: Diagnosis not present

## 2022-06-18 DIAGNOSIS — R32 Unspecified urinary incontinence: Secondary | ICD-10-CM | POA: Diagnosis not present

## 2022-06-18 DIAGNOSIS — F0283 Dementia in other diseases classified elsewhere, unspecified severity, with mood disturbance: Secondary | ICD-10-CM | POA: Diagnosis not present

## 2022-06-18 DIAGNOSIS — G311 Senile degeneration of brain, not elsewhere classified: Secondary | ICD-10-CM | POA: Diagnosis not present

## 2022-06-18 DIAGNOSIS — R296 Repeated falls: Secondary | ICD-10-CM | POA: Diagnosis not present

## 2022-06-19 DIAGNOSIS — R32 Unspecified urinary incontinence: Secondary | ICD-10-CM | POA: Diagnosis not present

## 2022-06-19 DIAGNOSIS — R159 Full incontinence of feces: Secondary | ICD-10-CM | POA: Diagnosis not present

## 2022-06-19 DIAGNOSIS — I10 Essential (primary) hypertension: Secondary | ICD-10-CM | POA: Diagnosis not present

## 2022-06-19 DIAGNOSIS — F331 Major depressive disorder, recurrent, moderate: Secondary | ICD-10-CM | POA: Diagnosis not present

## 2022-06-19 DIAGNOSIS — R296 Repeated falls: Secondary | ICD-10-CM | POA: Diagnosis not present

## 2022-06-19 DIAGNOSIS — I158 Other secondary hypertension: Secondary | ICD-10-CM | POA: Diagnosis not present

## 2022-06-19 DIAGNOSIS — F0283 Dementia in other diseases classified elsewhere, unspecified severity, with mood disturbance: Secondary | ICD-10-CM | POA: Diagnosis not present

## 2022-06-19 DIAGNOSIS — G311 Senile degeneration of brain, not elsewhere classified: Secondary | ICD-10-CM | POA: Diagnosis not present

## 2022-06-24 DIAGNOSIS — F0283 Dementia in other diseases classified elsewhere, unspecified severity, with mood disturbance: Secondary | ICD-10-CM | POA: Diagnosis not present

## 2022-06-24 DIAGNOSIS — I10 Essential (primary) hypertension: Secondary | ICD-10-CM | POA: Diagnosis not present

## 2022-06-24 DIAGNOSIS — R296 Repeated falls: Secondary | ICD-10-CM | POA: Diagnosis not present

## 2022-06-24 DIAGNOSIS — R32 Unspecified urinary incontinence: Secondary | ICD-10-CM | POA: Diagnosis not present

## 2022-06-24 DIAGNOSIS — G311 Senile degeneration of brain, not elsewhere classified: Secondary | ICD-10-CM | POA: Diagnosis not present

## 2022-06-24 DIAGNOSIS — R159 Full incontinence of feces: Secondary | ICD-10-CM | POA: Diagnosis not present

## 2022-06-25 DIAGNOSIS — I10 Essential (primary) hypertension: Secondary | ICD-10-CM | POA: Diagnosis not present

## 2022-06-25 DIAGNOSIS — F0283 Dementia in other diseases classified elsewhere, unspecified severity, with mood disturbance: Secondary | ICD-10-CM | POA: Diagnosis not present

## 2022-06-25 DIAGNOSIS — R32 Unspecified urinary incontinence: Secondary | ICD-10-CM | POA: Diagnosis not present

## 2022-06-25 DIAGNOSIS — R159 Full incontinence of feces: Secondary | ICD-10-CM | POA: Diagnosis not present

## 2022-06-25 DIAGNOSIS — G311 Senile degeneration of brain, not elsewhere classified: Secondary | ICD-10-CM | POA: Diagnosis not present

## 2022-06-25 DIAGNOSIS — R296 Repeated falls: Secondary | ICD-10-CM | POA: Diagnosis not present

## 2022-06-26 DIAGNOSIS — R32 Unspecified urinary incontinence: Secondary | ICD-10-CM | POA: Diagnosis not present

## 2022-06-26 DIAGNOSIS — I10 Essential (primary) hypertension: Secondary | ICD-10-CM | POA: Diagnosis not present

## 2022-06-26 DIAGNOSIS — R159 Full incontinence of feces: Secondary | ICD-10-CM | POA: Diagnosis not present

## 2022-06-26 DIAGNOSIS — R296 Repeated falls: Secondary | ICD-10-CM | POA: Diagnosis not present

## 2022-06-26 DIAGNOSIS — F0283 Dementia in other diseases classified elsewhere, unspecified severity, with mood disturbance: Secondary | ICD-10-CM | POA: Diagnosis not present

## 2022-06-26 DIAGNOSIS — G311 Senile degeneration of brain, not elsewhere classified: Secondary | ICD-10-CM | POA: Diagnosis not present

## 2022-07-02 DIAGNOSIS — I10 Essential (primary) hypertension: Secondary | ICD-10-CM | POA: Diagnosis not present

## 2022-07-02 DIAGNOSIS — F0283 Dementia in other diseases classified elsewhere, unspecified severity, with mood disturbance: Secondary | ICD-10-CM | POA: Diagnosis not present

## 2022-07-02 DIAGNOSIS — R159 Full incontinence of feces: Secondary | ICD-10-CM | POA: Diagnosis not present

## 2022-07-02 DIAGNOSIS — R32 Unspecified urinary incontinence: Secondary | ICD-10-CM | POA: Diagnosis not present

## 2022-07-02 DIAGNOSIS — R296 Repeated falls: Secondary | ICD-10-CM | POA: Diagnosis not present

## 2022-07-02 DIAGNOSIS — G311 Senile degeneration of brain, not elsewhere classified: Secondary | ICD-10-CM | POA: Diagnosis not present

## 2022-07-03 DIAGNOSIS — R296 Repeated falls: Secondary | ICD-10-CM | POA: Diagnosis not present

## 2022-07-03 DIAGNOSIS — G311 Senile degeneration of brain, not elsewhere classified: Secondary | ICD-10-CM | POA: Diagnosis not present

## 2022-07-03 DIAGNOSIS — R32 Unspecified urinary incontinence: Secondary | ICD-10-CM | POA: Diagnosis not present

## 2022-07-03 DIAGNOSIS — F0283 Dementia in other diseases classified elsewhere, unspecified severity, with mood disturbance: Secondary | ICD-10-CM | POA: Diagnosis not present

## 2022-07-03 DIAGNOSIS — R159 Full incontinence of feces: Secondary | ICD-10-CM | POA: Diagnosis not present

## 2022-07-03 DIAGNOSIS — I10 Essential (primary) hypertension: Secondary | ICD-10-CM | POA: Diagnosis not present

## 2022-07-05 DIAGNOSIS — R296 Repeated falls: Secondary | ICD-10-CM | POA: Diagnosis not present

## 2022-07-05 DIAGNOSIS — F0283 Dementia in other diseases classified elsewhere, unspecified severity, with mood disturbance: Secondary | ICD-10-CM | POA: Diagnosis not present

## 2022-07-05 DIAGNOSIS — I10 Essential (primary) hypertension: Secondary | ICD-10-CM | POA: Diagnosis not present

## 2022-07-05 DIAGNOSIS — R32 Unspecified urinary incontinence: Secondary | ICD-10-CM | POA: Diagnosis not present

## 2022-07-05 DIAGNOSIS — G311 Senile degeneration of brain, not elsewhere classified: Secondary | ICD-10-CM | POA: Diagnosis not present

## 2022-07-05 DIAGNOSIS — R159 Full incontinence of feces: Secondary | ICD-10-CM | POA: Diagnosis not present

## 2022-07-07 DIAGNOSIS — R159 Full incontinence of feces: Secondary | ICD-10-CM | POA: Diagnosis not present

## 2022-07-07 DIAGNOSIS — I10 Essential (primary) hypertension: Secondary | ICD-10-CM | POA: Diagnosis not present

## 2022-07-07 DIAGNOSIS — Z6820 Body mass index (BMI) 20.0-20.9, adult: Secondary | ICD-10-CM | POA: Diagnosis not present

## 2022-07-07 DIAGNOSIS — Z8781 Personal history of (healed) traumatic fracture: Secondary | ICD-10-CM | POA: Diagnosis not present

## 2022-07-07 DIAGNOSIS — F0283 Dementia in other diseases classified elsewhere, unspecified severity, with mood disturbance: Secondary | ICD-10-CM | POA: Diagnosis not present

## 2022-07-07 DIAGNOSIS — Z741 Need for assistance with personal care: Secondary | ICD-10-CM | POA: Diagnosis not present

## 2022-07-07 DIAGNOSIS — R32 Unspecified urinary incontinence: Secondary | ICD-10-CM | POA: Diagnosis not present

## 2022-07-07 DIAGNOSIS — G311 Senile degeneration of brain, not elsewhere classified: Secondary | ICD-10-CM | POA: Diagnosis not present

## 2022-07-07 DIAGNOSIS — R296 Repeated falls: Secondary | ICD-10-CM | POA: Diagnosis not present

## 2022-07-10 DIAGNOSIS — I10 Essential (primary) hypertension: Secondary | ICD-10-CM | POA: Diagnosis not present

## 2022-07-10 DIAGNOSIS — F0283 Dementia in other diseases classified elsewhere, unspecified severity, with mood disturbance: Secondary | ICD-10-CM | POA: Diagnosis not present

## 2022-07-10 DIAGNOSIS — R296 Repeated falls: Secondary | ICD-10-CM | POA: Diagnosis not present

## 2022-07-10 DIAGNOSIS — G311 Senile degeneration of brain, not elsewhere classified: Secondary | ICD-10-CM | POA: Diagnosis not present

## 2022-07-10 DIAGNOSIS — R32 Unspecified urinary incontinence: Secondary | ICD-10-CM | POA: Diagnosis not present

## 2022-07-10 DIAGNOSIS — R159 Full incontinence of feces: Secondary | ICD-10-CM | POA: Diagnosis not present

## 2022-07-11 DIAGNOSIS — R159 Full incontinence of feces: Secondary | ICD-10-CM | POA: Diagnosis not present

## 2022-07-11 DIAGNOSIS — I10 Essential (primary) hypertension: Secondary | ICD-10-CM | POA: Diagnosis not present

## 2022-07-11 DIAGNOSIS — R296 Repeated falls: Secondary | ICD-10-CM | POA: Diagnosis not present

## 2022-07-11 DIAGNOSIS — G311 Senile degeneration of brain, not elsewhere classified: Secondary | ICD-10-CM | POA: Diagnosis not present

## 2022-07-11 DIAGNOSIS — F0283 Dementia in other diseases classified elsewhere, unspecified severity, with mood disturbance: Secondary | ICD-10-CM | POA: Diagnosis not present

## 2022-07-11 DIAGNOSIS — R32 Unspecified urinary incontinence: Secondary | ICD-10-CM | POA: Diagnosis not present

## 2022-07-12 DIAGNOSIS — R296 Repeated falls: Secondary | ICD-10-CM | POA: Diagnosis not present

## 2022-07-12 DIAGNOSIS — G311 Senile degeneration of brain, not elsewhere classified: Secondary | ICD-10-CM | POA: Diagnosis not present

## 2022-07-12 DIAGNOSIS — R32 Unspecified urinary incontinence: Secondary | ICD-10-CM | POA: Diagnosis not present

## 2022-07-12 DIAGNOSIS — I10 Essential (primary) hypertension: Secondary | ICD-10-CM | POA: Diagnosis not present

## 2022-07-12 DIAGNOSIS — F0283 Dementia in other diseases classified elsewhere, unspecified severity, with mood disturbance: Secondary | ICD-10-CM | POA: Diagnosis not present

## 2022-07-12 DIAGNOSIS — R159 Full incontinence of feces: Secondary | ICD-10-CM | POA: Diagnosis not present

## 2022-07-17 DIAGNOSIS — R159 Full incontinence of feces: Secondary | ICD-10-CM | POA: Diagnosis not present

## 2022-07-17 DIAGNOSIS — R32 Unspecified urinary incontinence: Secondary | ICD-10-CM | POA: Diagnosis not present

## 2022-07-17 DIAGNOSIS — G311 Senile degeneration of brain, not elsewhere classified: Secondary | ICD-10-CM | POA: Diagnosis not present

## 2022-07-17 DIAGNOSIS — I10 Essential (primary) hypertension: Secondary | ICD-10-CM | POA: Diagnosis not present

## 2022-07-17 DIAGNOSIS — R296 Repeated falls: Secondary | ICD-10-CM | POA: Diagnosis not present

## 2022-07-17 DIAGNOSIS — F0283 Dementia in other diseases classified elsewhere, unspecified severity, with mood disturbance: Secondary | ICD-10-CM | POA: Diagnosis not present

## 2022-07-19 DIAGNOSIS — I10 Essential (primary) hypertension: Secondary | ICD-10-CM | POA: Diagnosis not present

## 2022-07-19 DIAGNOSIS — F0283 Dementia in other diseases classified elsewhere, unspecified severity, with mood disturbance: Secondary | ICD-10-CM | POA: Diagnosis not present

## 2022-07-19 DIAGNOSIS — R159 Full incontinence of feces: Secondary | ICD-10-CM | POA: Diagnosis not present

## 2022-07-19 DIAGNOSIS — G311 Senile degeneration of brain, not elsewhere classified: Secondary | ICD-10-CM | POA: Diagnosis not present

## 2022-07-19 DIAGNOSIS — R32 Unspecified urinary incontinence: Secondary | ICD-10-CM | POA: Diagnosis not present

## 2022-07-19 DIAGNOSIS — R296 Repeated falls: Secondary | ICD-10-CM | POA: Diagnosis not present

## 2022-07-24 DIAGNOSIS — G311 Senile degeneration of brain, not elsewhere classified: Secondary | ICD-10-CM | POA: Diagnosis not present

## 2022-07-24 DIAGNOSIS — R159 Full incontinence of feces: Secondary | ICD-10-CM | POA: Diagnosis not present

## 2022-07-24 DIAGNOSIS — I10 Essential (primary) hypertension: Secondary | ICD-10-CM | POA: Diagnosis not present

## 2022-07-24 DIAGNOSIS — R296 Repeated falls: Secondary | ICD-10-CM | POA: Diagnosis not present

## 2022-07-24 DIAGNOSIS — R32 Unspecified urinary incontinence: Secondary | ICD-10-CM | POA: Diagnosis not present

## 2022-07-24 DIAGNOSIS — F0283 Dementia in other diseases classified elsewhere, unspecified severity, with mood disturbance: Secondary | ICD-10-CM | POA: Diagnosis not present

## 2022-07-31 DIAGNOSIS — G311 Senile degeneration of brain, not elsewhere classified: Secondary | ICD-10-CM | POA: Diagnosis not present

## 2022-07-31 DIAGNOSIS — R296 Repeated falls: Secondary | ICD-10-CM | POA: Diagnosis not present

## 2022-07-31 DIAGNOSIS — F0283 Dementia in other diseases classified elsewhere, unspecified severity, with mood disturbance: Secondary | ICD-10-CM | POA: Diagnosis not present

## 2022-07-31 DIAGNOSIS — R159 Full incontinence of feces: Secondary | ICD-10-CM | POA: Diagnosis not present

## 2022-07-31 DIAGNOSIS — R32 Unspecified urinary incontinence: Secondary | ICD-10-CM | POA: Diagnosis not present

## 2022-07-31 DIAGNOSIS — I10 Essential (primary) hypertension: Secondary | ICD-10-CM | POA: Diagnosis not present

## 2022-08-07 DIAGNOSIS — F0283 Dementia in other diseases classified elsewhere, unspecified severity, with mood disturbance: Secondary | ICD-10-CM | POA: Diagnosis not present

## 2022-08-07 DIAGNOSIS — Z6820 Body mass index (BMI) 20.0-20.9, adult: Secondary | ICD-10-CM | POA: Diagnosis not present

## 2022-08-07 DIAGNOSIS — I10 Essential (primary) hypertension: Secondary | ICD-10-CM | POA: Diagnosis not present

## 2022-08-07 DIAGNOSIS — R296 Repeated falls: Secondary | ICD-10-CM | POA: Diagnosis not present

## 2022-08-07 DIAGNOSIS — G311 Senile degeneration of brain, not elsewhere classified: Secondary | ICD-10-CM | POA: Diagnosis not present

## 2022-08-07 DIAGNOSIS — R159 Full incontinence of feces: Secondary | ICD-10-CM | POA: Diagnosis not present

## 2022-08-07 DIAGNOSIS — R32 Unspecified urinary incontinence: Secondary | ICD-10-CM | POA: Diagnosis not present

## 2022-08-07 DIAGNOSIS — Z8781 Personal history of (healed) traumatic fracture: Secondary | ICD-10-CM | POA: Diagnosis not present

## 2022-08-07 DIAGNOSIS — Z741 Need for assistance with personal care: Secondary | ICD-10-CM | POA: Diagnosis not present

## 2022-08-09 DIAGNOSIS — I10 Essential (primary) hypertension: Secondary | ICD-10-CM | POA: Diagnosis not present

## 2022-08-09 DIAGNOSIS — G311 Senile degeneration of brain, not elsewhere classified: Secondary | ICD-10-CM | POA: Diagnosis not present

## 2022-08-09 DIAGNOSIS — R159 Full incontinence of feces: Secondary | ICD-10-CM | POA: Diagnosis not present

## 2022-08-09 DIAGNOSIS — R32 Unspecified urinary incontinence: Secondary | ICD-10-CM | POA: Diagnosis not present

## 2022-08-09 DIAGNOSIS — R296 Repeated falls: Secondary | ICD-10-CM | POA: Diagnosis not present

## 2022-08-09 DIAGNOSIS — F0283 Dementia in other diseases classified elsewhere, unspecified severity, with mood disturbance: Secondary | ICD-10-CM | POA: Diagnosis not present

## 2022-08-14 DIAGNOSIS — R32 Unspecified urinary incontinence: Secondary | ICD-10-CM | POA: Diagnosis not present

## 2022-08-14 DIAGNOSIS — R296 Repeated falls: Secondary | ICD-10-CM | POA: Diagnosis not present

## 2022-08-14 DIAGNOSIS — F0283 Dementia in other diseases classified elsewhere, unspecified severity, with mood disturbance: Secondary | ICD-10-CM | POA: Diagnosis not present

## 2022-08-14 DIAGNOSIS — G311 Senile degeneration of brain, not elsewhere classified: Secondary | ICD-10-CM | POA: Diagnosis not present

## 2022-08-14 DIAGNOSIS — R159 Full incontinence of feces: Secondary | ICD-10-CM | POA: Diagnosis not present

## 2022-08-14 DIAGNOSIS — I10 Essential (primary) hypertension: Secondary | ICD-10-CM | POA: Diagnosis not present

## 2022-08-21 DIAGNOSIS — G311 Senile degeneration of brain, not elsewhere classified: Secondary | ICD-10-CM | POA: Diagnosis not present

## 2022-08-21 DIAGNOSIS — R159 Full incontinence of feces: Secondary | ICD-10-CM | POA: Diagnosis not present

## 2022-08-21 DIAGNOSIS — F0283 Dementia in other diseases classified elsewhere, unspecified severity, with mood disturbance: Secondary | ICD-10-CM | POA: Diagnosis not present

## 2022-08-21 DIAGNOSIS — I10 Essential (primary) hypertension: Secondary | ICD-10-CM | POA: Diagnosis not present

## 2022-08-21 DIAGNOSIS — R32 Unspecified urinary incontinence: Secondary | ICD-10-CM | POA: Diagnosis not present

## 2022-08-21 DIAGNOSIS — R296 Repeated falls: Secondary | ICD-10-CM | POA: Diagnosis not present

## 2022-08-22 DIAGNOSIS — F331 Major depressive disorder, recurrent, moderate: Secondary | ICD-10-CM | POA: Diagnosis not present

## 2022-08-22 DIAGNOSIS — I10 Essential (primary) hypertension: Secondary | ICD-10-CM | POA: Diagnosis not present

## 2022-08-22 DIAGNOSIS — F0390 Unspecified dementia without behavioral disturbance: Secondary | ICD-10-CM | POA: Diagnosis not present

## 2022-08-22 DIAGNOSIS — K5904 Chronic idiopathic constipation: Secondary | ICD-10-CM | POA: Diagnosis not present

## 2022-08-22 DIAGNOSIS — K5901 Slow transit constipation: Secondary | ICD-10-CM | POA: Diagnosis not present

## 2022-08-22 DIAGNOSIS — Z9181 History of falling: Secondary | ICD-10-CM | POA: Diagnosis not present

## 2022-08-27 DIAGNOSIS — R32 Unspecified urinary incontinence: Secondary | ICD-10-CM | POA: Diagnosis not present

## 2022-08-27 DIAGNOSIS — R296 Repeated falls: Secondary | ICD-10-CM | POA: Diagnosis not present

## 2022-08-27 DIAGNOSIS — I10 Essential (primary) hypertension: Secondary | ICD-10-CM | POA: Diagnosis not present

## 2022-08-27 DIAGNOSIS — R159 Full incontinence of feces: Secondary | ICD-10-CM | POA: Diagnosis not present

## 2022-08-27 DIAGNOSIS — F0283 Dementia in other diseases classified elsewhere, unspecified severity, with mood disturbance: Secondary | ICD-10-CM | POA: Diagnosis not present

## 2022-08-27 DIAGNOSIS — G311 Senile degeneration of brain, not elsewhere classified: Secondary | ICD-10-CM | POA: Diagnosis not present

## 2022-08-28 DIAGNOSIS — I10 Essential (primary) hypertension: Secondary | ICD-10-CM | POA: Diagnosis not present

## 2022-08-28 DIAGNOSIS — F0283 Dementia in other diseases classified elsewhere, unspecified severity, with mood disturbance: Secondary | ICD-10-CM | POA: Diagnosis not present

## 2022-08-28 DIAGNOSIS — R32 Unspecified urinary incontinence: Secondary | ICD-10-CM | POA: Diagnosis not present

## 2022-08-28 DIAGNOSIS — G311 Senile degeneration of brain, not elsewhere classified: Secondary | ICD-10-CM | POA: Diagnosis not present

## 2022-08-28 DIAGNOSIS — R296 Repeated falls: Secondary | ICD-10-CM | POA: Diagnosis not present

## 2022-08-28 DIAGNOSIS — R159 Full incontinence of feces: Secondary | ICD-10-CM | POA: Diagnosis not present

## 2022-09-03 DIAGNOSIS — R32 Unspecified urinary incontinence: Secondary | ICD-10-CM | POA: Diagnosis not present

## 2022-09-03 DIAGNOSIS — G311 Senile degeneration of brain, not elsewhere classified: Secondary | ICD-10-CM | POA: Diagnosis not present

## 2022-09-03 DIAGNOSIS — F0283 Dementia in other diseases classified elsewhere, unspecified severity, with mood disturbance: Secondary | ICD-10-CM | POA: Diagnosis not present

## 2022-09-03 DIAGNOSIS — I10 Essential (primary) hypertension: Secondary | ICD-10-CM | POA: Diagnosis not present

## 2022-09-03 DIAGNOSIS — R296 Repeated falls: Secondary | ICD-10-CM | POA: Diagnosis not present

## 2022-09-03 DIAGNOSIS — R159 Full incontinence of feces: Secondary | ICD-10-CM | POA: Diagnosis not present

## 2022-09-04 DIAGNOSIS — G311 Senile degeneration of brain, not elsewhere classified: Secondary | ICD-10-CM | POA: Diagnosis not present

## 2022-09-04 DIAGNOSIS — R32 Unspecified urinary incontinence: Secondary | ICD-10-CM | POA: Diagnosis not present

## 2022-09-04 DIAGNOSIS — R296 Repeated falls: Secondary | ICD-10-CM | POA: Diagnosis not present

## 2022-09-04 DIAGNOSIS — I10 Essential (primary) hypertension: Secondary | ICD-10-CM | POA: Diagnosis not present

## 2022-09-04 DIAGNOSIS — F0283 Dementia in other diseases classified elsewhere, unspecified severity, with mood disturbance: Secondary | ICD-10-CM | POA: Diagnosis not present

## 2022-09-04 DIAGNOSIS — R159 Full incontinence of feces: Secondary | ICD-10-CM | POA: Diagnosis not present

## 2022-09-06 DIAGNOSIS — G311 Senile degeneration of brain, not elsewhere classified: Secondary | ICD-10-CM | POA: Diagnosis not present

## 2022-09-06 DIAGNOSIS — R159 Full incontinence of feces: Secondary | ICD-10-CM | POA: Diagnosis not present

## 2022-09-06 DIAGNOSIS — F0283 Dementia in other diseases classified elsewhere, unspecified severity, with mood disturbance: Secondary | ICD-10-CM | POA: Diagnosis not present

## 2022-09-06 DIAGNOSIS — R32 Unspecified urinary incontinence: Secondary | ICD-10-CM | POA: Diagnosis not present

## 2022-09-06 DIAGNOSIS — R296 Repeated falls: Secondary | ICD-10-CM | POA: Diagnosis not present

## 2022-09-06 DIAGNOSIS — Z6820 Body mass index (BMI) 20.0-20.9, adult: Secondary | ICD-10-CM | POA: Diagnosis not present

## 2022-09-06 DIAGNOSIS — Z8781 Personal history of (healed) traumatic fracture: Secondary | ICD-10-CM | POA: Diagnosis not present

## 2022-09-06 DIAGNOSIS — I10 Essential (primary) hypertension: Secondary | ICD-10-CM | POA: Diagnosis not present

## 2022-09-06 DIAGNOSIS — Z741 Need for assistance with personal care: Secondary | ICD-10-CM | POA: Diagnosis not present

## 2022-09-09 DIAGNOSIS — G311 Senile degeneration of brain, not elsewhere classified: Secondary | ICD-10-CM | POA: Diagnosis not present

## 2022-09-09 DIAGNOSIS — R159 Full incontinence of feces: Secondary | ICD-10-CM | POA: Diagnosis not present

## 2022-09-09 DIAGNOSIS — F0283 Dementia in other diseases classified elsewhere, unspecified severity, with mood disturbance: Secondary | ICD-10-CM | POA: Diagnosis not present

## 2022-09-09 DIAGNOSIS — R296 Repeated falls: Secondary | ICD-10-CM | POA: Diagnosis not present

## 2022-09-09 DIAGNOSIS — I10 Essential (primary) hypertension: Secondary | ICD-10-CM | POA: Diagnosis not present

## 2022-09-09 DIAGNOSIS — R32 Unspecified urinary incontinence: Secondary | ICD-10-CM | POA: Diagnosis not present

## 2022-09-11 DIAGNOSIS — R296 Repeated falls: Secondary | ICD-10-CM | POA: Diagnosis not present

## 2022-09-11 DIAGNOSIS — R32 Unspecified urinary incontinence: Secondary | ICD-10-CM | POA: Diagnosis not present

## 2022-09-11 DIAGNOSIS — G311 Senile degeneration of brain, not elsewhere classified: Secondary | ICD-10-CM | POA: Diagnosis not present

## 2022-09-11 DIAGNOSIS — F0283 Dementia in other diseases classified elsewhere, unspecified severity, with mood disturbance: Secondary | ICD-10-CM | POA: Diagnosis not present

## 2022-09-11 DIAGNOSIS — R159 Full incontinence of feces: Secondary | ICD-10-CM | POA: Diagnosis not present

## 2022-09-11 DIAGNOSIS — I10 Essential (primary) hypertension: Secondary | ICD-10-CM | POA: Diagnosis not present

## 2022-09-17 DIAGNOSIS — F0283 Dementia in other diseases classified elsewhere, unspecified severity, with mood disturbance: Secondary | ICD-10-CM | POA: Diagnosis not present

## 2022-09-17 DIAGNOSIS — I10 Essential (primary) hypertension: Secondary | ICD-10-CM | POA: Diagnosis not present

## 2022-09-17 DIAGNOSIS — R159 Full incontinence of feces: Secondary | ICD-10-CM | POA: Diagnosis not present

## 2022-09-17 DIAGNOSIS — G311 Senile degeneration of brain, not elsewhere classified: Secondary | ICD-10-CM | POA: Diagnosis not present

## 2022-09-17 DIAGNOSIS — R32 Unspecified urinary incontinence: Secondary | ICD-10-CM | POA: Diagnosis not present

## 2022-09-17 DIAGNOSIS — R296 Repeated falls: Secondary | ICD-10-CM | POA: Diagnosis not present

## 2022-09-18 DIAGNOSIS — F0283 Dementia in other diseases classified elsewhere, unspecified severity, with mood disturbance: Secondary | ICD-10-CM | POA: Diagnosis not present

## 2022-09-18 DIAGNOSIS — R296 Repeated falls: Secondary | ICD-10-CM | POA: Diagnosis not present

## 2022-09-18 DIAGNOSIS — R159 Full incontinence of feces: Secondary | ICD-10-CM | POA: Diagnosis not present

## 2022-09-18 DIAGNOSIS — G311 Senile degeneration of brain, not elsewhere classified: Secondary | ICD-10-CM | POA: Diagnosis not present

## 2022-09-18 DIAGNOSIS — I10 Essential (primary) hypertension: Secondary | ICD-10-CM | POA: Diagnosis not present

## 2022-09-18 DIAGNOSIS — R32 Unspecified urinary incontinence: Secondary | ICD-10-CM | POA: Diagnosis not present

## 2022-09-19 DIAGNOSIS — G311 Senile degeneration of brain, not elsewhere classified: Secondary | ICD-10-CM | POA: Diagnosis not present

## 2022-09-19 DIAGNOSIS — F0283 Dementia in other diseases classified elsewhere, unspecified severity, with mood disturbance: Secondary | ICD-10-CM | POA: Diagnosis not present

## 2022-09-19 DIAGNOSIS — I10 Essential (primary) hypertension: Secondary | ICD-10-CM | POA: Diagnosis not present

## 2022-09-19 DIAGNOSIS — R296 Repeated falls: Secondary | ICD-10-CM | POA: Diagnosis not present

## 2022-09-19 DIAGNOSIS — R32 Unspecified urinary incontinence: Secondary | ICD-10-CM | POA: Diagnosis not present

## 2022-09-19 DIAGNOSIS — R159 Full incontinence of feces: Secondary | ICD-10-CM | POA: Diagnosis not present

## 2022-09-20 DIAGNOSIS — R32 Unspecified urinary incontinence: Secondary | ICD-10-CM | POA: Diagnosis not present

## 2022-09-20 DIAGNOSIS — G311 Senile degeneration of brain, not elsewhere classified: Secondary | ICD-10-CM | POA: Diagnosis not present

## 2022-09-20 DIAGNOSIS — R296 Repeated falls: Secondary | ICD-10-CM | POA: Diagnosis not present

## 2022-09-20 DIAGNOSIS — F0283 Dementia in other diseases classified elsewhere, unspecified severity, with mood disturbance: Secondary | ICD-10-CM | POA: Diagnosis not present

## 2022-09-20 DIAGNOSIS — R159 Full incontinence of feces: Secondary | ICD-10-CM | POA: Diagnosis not present

## 2022-09-20 DIAGNOSIS — I10 Essential (primary) hypertension: Secondary | ICD-10-CM | POA: Diagnosis not present

## 2022-09-21 DIAGNOSIS — G311 Senile degeneration of brain, not elsewhere classified: Secondary | ICD-10-CM | POA: Diagnosis not present

## 2022-09-21 DIAGNOSIS — I10 Essential (primary) hypertension: Secondary | ICD-10-CM | POA: Diagnosis not present

## 2022-09-21 DIAGNOSIS — F0283 Dementia in other diseases classified elsewhere, unspecified severity, with mood disturbance: Secondary | ICD-10-CM | POA: Diagnosis not present

## 2022-09-21 DIAGNOSIS — R159 Full incontinence of feces: Secondary | ICD-10-CM | POA: Diagnosis not present

## 2022-09-21 DIAGNOSIS — R296 Repeated falls: Secondary | ICD-10-CM | POA: Diagnosis not present

## 2022-09-21 DIAGNOSIS — R32 Unspecified urinary incontinence: Secondary | ICD-10-CM | POA: Diagnosis not present

## 2022-09-22 DIAGNOSIS — R32 Unspecified urinary incontinence: Secondary | ICD-10-CM | POA: Diagnosis not present

## 2022-09-22 DIAGNOSIS — I10 Essential (primary) hypertension: Secondary | ICD-10-CM | POA: Diagnosis not present

## 2022-09-22 DIAGNOSIS — R296 Repeated falls: Secondary | ICD-10-CM | POA: Diagnosis not present

## 2022-09-22 DIAGNOSIS — R159 Full incontinence of feces: Secondary | ICD-10-CM | POA: Diagnosis not present

## 2022-09-22 DIAGNOSIS — G311 Senile degeneration of brain, not elsewhere classified: Secondary | ICD-10-CM | POA: Diagnosis not present

## 2022-09-22 DIAGNOSIS — F0283 Dementia in other diseases classified elsewhere, unspecified severity, with mood disturbance: Secondary | ICD-10-CM | POA: Diagnosis not present

## 2022-09-23 DIAGNOSIS — R296 Repeated falls: Secondary | ICD-10-CM | POA: Diagnosis not present

## 2022-09-23 DIAGNOSIS — R159 Full incontinence of feces: Secondary | ICD-10-CM | POA: Diagnosis not present

## 2022-09-23 DIAGNOSIS — G311 Senile degeneration of brain, not elsewhere classified: Secondary | ICD-10-CM | POA: Diagnosis not present

## 2022-09-23 DIAGNOSIS — R32 Unspecified urinary incontinence: Secondary | ICD-10-CM | POA: Diagnosis not present

## 2022-09-23 DIAGNOSIS — F0283 Dementia in other diseases classified elsewhere, unspecified severity, with mood disturbance: Secondary | ICD-10-CM | POA: Diagnosis not present

## 2022-09-23 DIAGNOSIS — I10 Essential (primary) hypertension: Secondary | ICD-10-CM | POA: Diagnosis not present

## 2022-09-24 DIAGNOSIS — R32 Unspecified urinary incontinence: Secondary | ICD-10-CM | POA: Diagnosis not present

## 2022-09-24 DIAGNOSIS — R296 Repeated falls: Secondary | ICD-10-CM | POA: Diagnosis not present

## 2022-09-24 DIAGNOSIS — R159 Full incontinence of feces: Secondary | ICD-10-CM | POA: Diagnosis not present

## 2022-09-24 DIAGNOSIS — I10 Essential (primary) hypertension: Secondary | ICD-10-CM | POA: Diagnosis not present

## 2022-09-24 DIAGNOSIS — F0283 Dementia in other diseases classified elsewhere, unspecified severity, with mood disturbance: Secondary | ICD-10-CM | POA: Diagnosis not present

## 2022-09-24 DIAGNOSIS — G311 Senile degeneration of brain, not elsewhere classified: Secondary | ICD-10-CM | POA: Diagnosis not present

## 2022-09-25 DIAGNOSIS — R32 Unspecified urinary incontinence: Secondary | ICD-10-CM | POA: Diagnosis not present

## 2022-09-25 DIAGNOSIS — G311 Senile degeneration of brain, not elsewhere classified: Secondary | ICD-10-CM | POA: Diagnosis not present

## 2022-09-25 DIAGNOSIS — F0283 Dementia in other diseases classified elsewhere, unspecified severity, with mood disturbance: Secondary | ICD-10-CM | POA: Diagnosis not present

## 2022-09-25 DIAGNOSIS — R296 Repeated falls: Secondary | ICD-10-CM | POA: Diagnosis not present

## 2022-09-25 DIAGNOSIS — I10 Essential (primary) hypertension: Secondary | ICD-10-CM | POA: Diagnosis not present

## 2022-09-25 DIAGNOSIS — R159 Full incontinence of feces: Secondary | ICD-10-CM | POA: Diagnosis not present

## 2022-09-26 DIAGNOSIS — I10 Essential (primary) hypertension: Secondary | ICD-10-CM | POA: Diagnosis not present

## 2022-09-26 DIAGNOSIS — R159 Full incontinence of feces: Secondary | ICD-10-CM | POA: Diagnosis not present

## 2022-09-26 DIAGNOSIS — R32 Unspecified urinary incontinence: Secondary | ICD-10-CM | POA: Diagnosis not present

## 2022-09-26 DIAGNOSIS — G311 Senile degeneration of brain, not elsewhere classified: Secondary | ICD-10-CM | POA: Diagnosis not present

## 2022-09-26 DIAGNOSIS — R296 Repeated falls: Secondary | ICD-10-CM | POA: Diagnosis not present

## 2022-09-26 DIAGNOSIS — F0283 Dementia in other diseases classified elsewhere, unspecified severity, with mood disturbance: Secondary | ICD-10-CM | POA: Diagnosis not present

## 2022-09-27 DIAGNOSIS — I10 Essential (primary) hypertension: Secondary | ICD-10-CM | POA: Diagnosis not present

## 2022-09-27 DIAGNOSIS — R296 Repeated falls: Secondary | ICD-10-CM | POA: Diagnosis not present

## 2022-09-27 DIAGNOSIS — R32 Unspecified urinary incontinence: Secondary | ICD-10-CM | POA: Diagnosis not present

## 2022-09-27 DIAGNOSIS — R159 Full incontinence of feces: Secondary | ICD-10-CM | POA: Diagnosis not present

## 2022-09-27 DIAGNOSIS — F0283 Dementia in other diseases classified elsewhere, unspecified severity, with mood disturbance: Secondary | ICD-10-CM | POA: Diagnosis not present

## 2022-09-27 DIAGNOSIS — G311 Senile degeneration of brain, not elsewhere classified: Secondary | ICD-10-CM | POA: Diagnosis not present

## 2022-10-07 DEATH — deceased
